# Patient Record
Sex: Female | Born: 1970 | Race: White | Hispanic: No | Smoking: Former smoker
Health system: Southern US, Community
[De-identification: ages and names within clinical notes are randomized; demographics above are authoritative.]

## PROBLEM LIST (undated history)

## (undated) DIAGNOSIS — F909 Attention-deficit hyperactivity disorder, unspecified type: Secondary | ICD-10-CM

## (undated) DIAGNOSIS — F329 Major depressive disorder, single episode, unspecified: Secondary | ICD-10-CM

## (undated) DIAGNOSIS — F32A Depression, unspecified: Secondary | ICD-10-CM

## (undated) DIAGNOSIS — F419 Anxiety disorder, unspecified: Secondary | ICD-10-CM

## (undated) DIAGNOSIS — Z8619 Personal history of other infectious and parasitic diseases: Secondary | ICD-10-CM

## (undated) HISTORY — PX: BACK SURGERY: SHX140

## (undated) HISTORY — PX: ABDOMINAL HYSTERECTOMY: SHX81

## (undated) HISTORY — PX: OTHER SURGICAL HISTORY: SHX169

---

## 1999-08-17 ENCOUNTER — Inpatient Hospital Stay (HOSPITAL_COMMUNITY): Admission: AD | Admit: 1999-08-17 | Discharge: 1999-08-17 | Payer: Self-pay | Admitting: Obstetrics

## 2000-01-23 ENCOUNTER — Emergency Department (HOSPITAL_COMMUNITY): Admission: EM | Admit: 2000-01-23 | Discharge: 2000-01-23 | Payer: Self-pay | Admitting: Emergency Medicine

## 2000-03-23 ENCOUNTER — Emergency Department (HOSPITAL_COMMUNITY): Admission: EM | Admit: 2000-03-23 | Discharge: 2000-03-24 | Payer: Self-pay | Admitting: Emergency Medicine

## 2003-04-06 ENCOUNTER — Other Ambulatory Visit: Admission: RE | Admit: 2003-04-06 | Discharge: 2003-04-06 | Payer: Self-pay | Admitting: Obstetrics & Gynecology

## 2003-10-23 ENCOUNTER — Inpatient Hospital Stay (HOSPITAL_COMMUNITY): Admission: AD | Admit: 2003-10-23 | Discharge: 2003-10-25 | Payer: Self-pay | Admitting: Obstetrics & Gynecology

## 2003-12-02 ENCOUNTER — Encounter (INDEPENDENT_AMBULATORY_CARE_PROVIDER_SITE_OTHER): Payer: Self-pay

## 2003-12-02 ENCOUNTER — Ambulatory Visit (HOSPITAL_COMMUNITY): Admission: RE | Admit: 2003-12-02 | Discharge: 2003-12-02 | Payer: Self-pay | Admitting: Obstetrics and Gynecology

## 2004-09-02 ENCOUNTER — Emergency Department (HOSPITAL_COMMUNITY): Admission: EM | Admit: 2004-09-02 | Discharge: 2004-09-03 | Payer: Self-pay | Admitting: Emergency Medicine

## 2006-02-19 ENCOUNTER — Inpatient Hospital Stay (HOSPITAL_COMMUNITY): Admission: EM | Admit: 2006-02-19 | Discharge: 2006-03-01 | Payer: Self-pay | Admitting: Emergency Medicine

## 2006-02-20 ENCOUNTER — Ambulatory Visit: Payer: Self-pay | Admitting: Pulmonary Disease

## 2006-02-25 ENCOUNTER — Encounter (INDEPENDENT_AMBULATORY_CARE_PROVIDER_SITE_OTHER): Payer: Self-pay | Admitting: *Deleted

## 2006-02-27 ENCOUNTER — Ambulatory Visit: Payer: Self-pay | Admitting: Infectious Diseases

## 2007-01-19 ENCOUNTER — Emergency Department (HOSPITAL_COMMUNITY): Admission: EM | Admit: 2007-01-19 | Discharge: 2007-01-19 | Payer: Self-pay | Admitting: *Deleted

## 2007-04-21 ENCOUNTER — Emergency Department (HOSPITAL_COMMUNITY): Admission: EM | Admit: 2007-04-21 | Discharge: 2007-04-21 | Payer: Self-pay | Admitting: Emergency Medicine

## 2007-09-25 ENCOUNTER — Encounter (INDEPENDENT_AMBULATORY_CARE_PROVIDER_SITE_OTHER): Payer: Self-pay | Admitting: Specialist

## 2007-09-25 ENCOUNTER — Observation Stay (HOSPITAL_COMMUNITY): Admission: RE | Admit: 2007-09-25 | Discharge: 2007-09-26 | Payer: Self-pay | Admitting: Specialist

## 2007-10-02 ENCOUNTER — Inpatient Hospital Stay (HOSPITAL_COMMUNITY): Admission: EM | Admit: 2007-10-02 | Discharge: 2007-10-14 | Payer: Self-pay | Admitting: Emergency Medicine

## 2007-10-03 ENCOUNTER — Ambulatory Visit: Payer: Self-pay | Admitting: Infectious Diseases

## 2007-10-13 ENCOUNTER — Encounter: Payer: Self-pay | Admitting: Infectious Diseases

## 2007-10-13 ENCOUNTER — Ambulatory Visit: Payer: Self-pay | Admitting: Surgery

## 2007-10-23 ENCOUNTER — Encounter: Payer: Self-pay | Admitting: Infectious Disease

## 2007-11-19 ENCOUNTER — Encounter: Payer: Self-pay | Admitting: Infectious Disease

## 2007-11-19 ENCOUNTER — Ambulatory Visit (HOSPITAL_COMMUNITY): Admission: RE | Admit: 2007-11-19 | Discharge: 2007-11-19 | Payer: Self-pay | Admitting: Infectious Disease

## 2007-11-19 ENCOUNTER — Ambulatory Visit: Payer: Self-pay | Admitting: Infectious Disease

## 2007-11-19 ENCOUNTER — Ambulatory Visit: Payer: Self-pay | Admitting: Surgery

## 2007-11-19 DIAGNOSIS — G061 Intraspinal abscess and granuloma: Secondary | ICD-10-CM

## 2007-11-19 DIAGNOSIS — A4901 Methicillin susceptible Staphylococcus aureus infection, unspecified site: Secondary | ICD-10-CM | POA: Insufficient documentation

## 2007-11-19 DIAGNOSIS — Z8619 Personal history of other infectious and parasitic diseases: Secondary | ICD-10-CM

## 2007-11-19 DIAGNOSIS — T80218A Other infection due to central venous catheter, initial encounter: Secondary | ICD-10-CM

## 2007-11-19 DIAGNOSIS — G9611 Dural tear: Secondary | ICD-10-CM

## 2007-11-19 LAB — CONVERTED CEMR LAB
Basophils Relative: 0 % (ref 0–1)
CO2: 23 meq/L (ref 19–32)
Calcium: 9.7 mg/dL (ref 8.4–10.5)
Creatinine, Ser: 0.72 mg/dL (ref 0.40–1.20)
Eosinophils Absolute: 0.3 10*3/uL (ref 0.2–0.7)
Eosinophils Relative: 4 % (ref 0–5)
Glucose, Bld: 90 mg/dL (ref 70–99)
HCT: 38.8 % (ref 36.0–46.0)
Lymphs Abs: 2 10*3/uL (ref 0.7–4.0)
MCHC: 33 g/dL (ref 30.0–36.0)
MCV: 96.8 fL (ref 78.0–100.0)
Monocytes Relative: 11 % (ref 3–12)
Platelets: 285 10*3/uL (ref 150–400)
Sed Rate: 22 mm/hr (ref 0–22)
WBC: 7.2 10*3/uL (ref 4.0–10.5)

## 2007-11-25 ENCOUNTER — Encounter: Payer: Self-pay | Admitting: Infectious Disease

## 2007-11-27 ENCOUNTER — Encounter: Payer: Self-pay | Admitting: Infectious Disease

## 2007-12-01 ENCOUNTER — Encounter: Payer: Self-pay | Admitting: Infectious Disease

## 2007-12-02 ENCOUNTER — Encounter: Payer: Self-pay | Admitting: Infectious Disease

## 2007-12-22 ENCOUNTER — Telehealth: Payer: Self-pay | Admitting: Infectious Disease

## 2007-12-23 ENCOUNTER — Encounter: Payer: Self-pay | Admitting: Infectious Disease

## 2008-01-27 ENCOUNTER — Emergency Department (HOSPITAL_COMMUNITY): Admission: EM | Admit: 2008-01-27 | Discharge: 2008-01-27 | Payer: Self-pay | Admitting: Emergency Medicine

## 2008-03-24 ENCOUNTER — Ambulatory Visit (HOSPITAL_COMMUNITY): Admission: RE | Admit: 2008-03-24 | Discharge: 2008-03-24 | Payer: Self-pay | Admitting: Specialist

## 2008-04-27 ENCOUNTER — Encounter: Admission: RE | Admit: 2008-04-27 | Discharge: 2008-05-12 | Payer: Self-pay | Admitting: Specialist

## 2008-05-24 ENCOUNTER — Encounter: Payer: Self-pay | Admitting: Infectious Disease

## 2008-09-12 ENCOUNTER — Ambulatory Visit (HOSPITAL_COMMUNITY): Admission: RE | Admit: 2008-09-12 | Discharge: 2008-09-12 | Payer: Self-pay | Admitting: Specialist

## 2009-02-07 ENCOUNTER — Encounter: Admission: RE | Admit: 2009-02-07 | Discharge: 2009-02-07 | Payer: Self-pay | Admitting: Orthopaedic Surgery

## 2009-08-11 ENCOUNTER — Encounter: Admission: RE | Admit: 2009-08-11 | Discharge: 2009-08-11 | Payer: Self-pay | Admitting: Orthopaedic Surgery

## 2009-08-27 IMAGING — CR DG CHEST 2V
2 series · 2 of 2 positions shown · non-contrast
Comparison: Portable study 10/03/07.

CLINICAL DATA: Post-op lumbar surgery. Fever and leukocytosis.
CHEST - 2 VIEW:

[w chest lat]
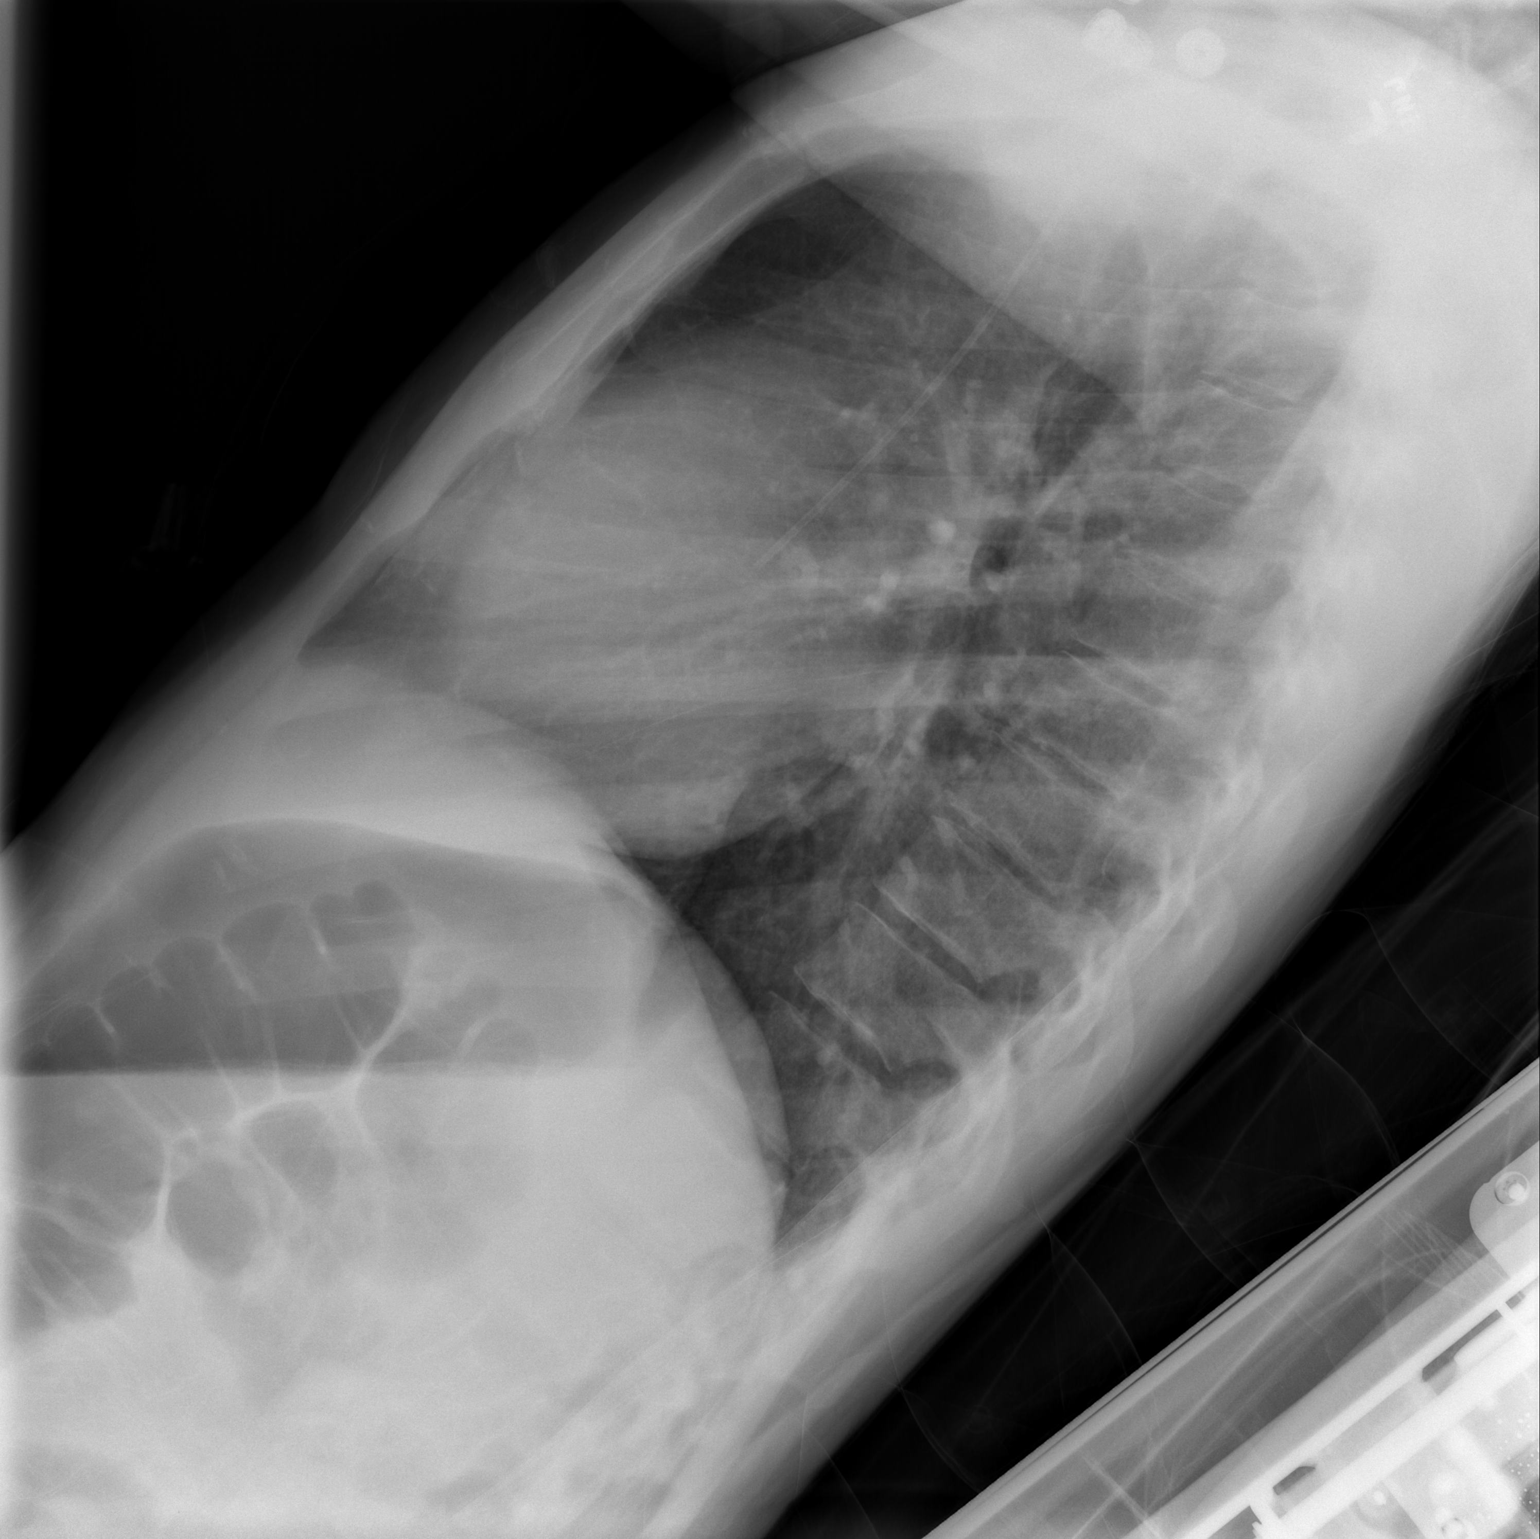

[view not recorded]
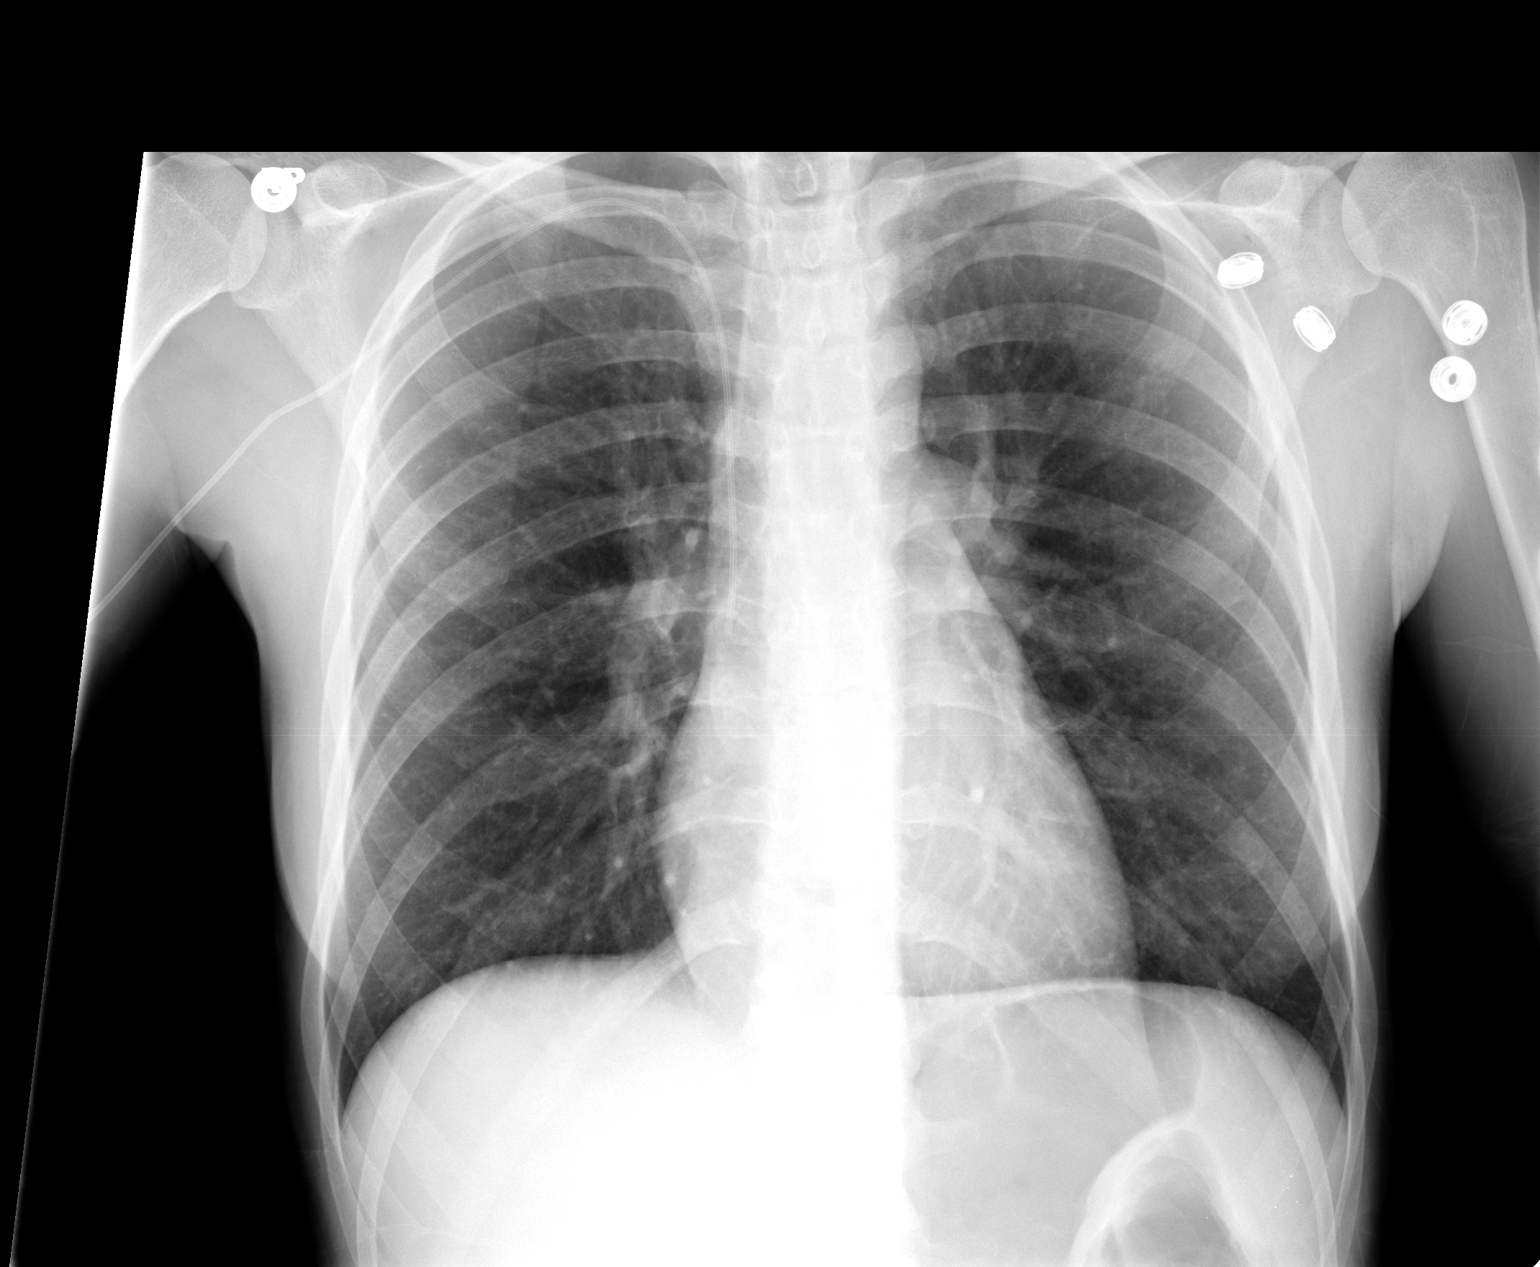

[2 of 2 positions shown; findings below may reference images not displayed]

FINDINGS: The right arm PICC is unchanged at the SVC-right atrial junction.  The lungs are clear.  There is no pleural effusion.  The cardiomediastinal contours are unchanged.
IMPRESSION: Stable postoperative chest.  No evidence of pneumonia.

## 2011-05-01 NOTE — Consult Note (Signed)
[name], [name]                ACCOUNT NO.:  [id number]   MEDICAL RECORD NO.:  [id number]          PATIENT TYPE:  INP   LOCATION:  1232                         FACILITY:  [facility]   PHYSICIAN:  Marlan Palau, M.D.  DATE OF BIRTH:  [birth year]   DATE OF CONSULTATION:  10/03/2007  DATE OF DISCHARGE:                                 CONSULTATION   HISTORY OF PRESENT ILLNESS:  [name] is a 40 year old right-  handed white female born 1971/09/30 with a history of an L5-S1  operative procedure that was done on September 25, 2007.  This was done for  herniated disk with significant spinal stenosis and cauda equina of  compression.  The patient had some weakness and numbness down the right  leg prior to this surgery.  The patient was decompressed and seemed to  recover fairly well.  The patient, however, several days after surgery  began developing fevers with increased pain in the low back to the point  where she was having trouble getting in and out of bed even to get to  the bathroom.  The patient came to the emergency room on October 02, 2007 and the patient underwent an exploratory procedure to evaluate the  patient for a possible wound infection.  The patient was found to have  pus in the subcutaneous tissues tracking down to the surgical area in  the spine.  No epidural abscess was noted, but a small rent or tear in  the dural sac was identified and repaired.  The patient was debrided.  Postoperatively, the patient had very high fevers in the 102 range and  began to complain of increased headache, nuchal rigidity and discomfort  down both legs.  Infectious disease was consulted and appropriate  antibiotic therapy was initiated.  The patient has been treated with  pain medications and has begun to feel better.  Temperature is now down.  Headache is better, but nuchal rigidity still is present.  Neurology was  asked to see this patient for further evaluation.  The patient has  had  no change in motor strength or sensation in the legs or arms.   PAST MEDICAL HISTORY:  1. Significant for the recent debridement for wound infection with      probable transient bacteremia.  2. Lumbosacral spine surgery at the L5-S1 level on September 25, 2007 as      above.  3. History of anxiety and depression.  4  History of bilateral occipital neuralgia.  1. History of left elbow fracture and surgery in the past.  2. Bilateral tubal ligation.   CURRENT MEDICATIONS:  1. Zosyn 3.375 grams q.6 h.  2. Vancomycin 1,000 mg q.12 h.  3. Tylenol if needed.  4. Vicodin if needed.  5. Robaxin 500 mg q.6 h. if needed.  6. Phenergan if needed.   ALLERGIES:  PENICILLIN.   SOCIAL HISTORY:  Smokes a pack of cigarettes a day.  Does not drink  alcohol but, apparently has had some alcohol overuse in the past, but  denies use of cocaine, marijuana.  This  [place], lives with a boyfriend and has 3 children all alive  and well.   FAMILY MEDICAL HISTORY:  Father died with an MI.  Mother is still  living, but has significant obesity and may have obstructive sleep  apnea.   REVIEW OF SYSTEMS:  Notable for the fevers as noted above.  The patient  has reports of headache that has improved some since its onset.  The  patient has denied any problems with breathing, shortness of breath,  chest pain, abdominal pain.  The patient denies any abdominal pain.  The  patient has noted some numbness of the right leg that she feels mainly  involves the thigh at this point and has not changed significantly from  preoperative symptoms.  The patient denies any problems controlling the  bowels or bladder prior to coming in for surgery.  The patient denied  any headaches prior to surgery.   PHYSICAL EXAMINATION:  VITAL SIGNS:  Blood pressure is currently 90/47,  heart rate is 97, respiratory rate 16, temperature is 99.5.  HEENT:  Head is atraumatic.  Eyes:  Pupils  are equal, round, react to  light.  Disks are flat bilaterally.  NECK:  Somewhat stiff.  RESPIRATORY:  Clear.  CARDIOVASCULAR:  Reveals a regular rate and rhythm.  No obvious murmurs  or rubs noted.  No carotid bruits are noted.  EXTREMITIES:  Without significant edema.  NEUROLOGIC:  Cranial nerves as above.  Facial symmetry is present.  Patient has good sensation of face to pinprick bilaterally.  Good visual  fields.  Extraocular movements are full.  Speech is well enunciated, not  aphasic.  Patient is fully oriented.  The patient moves all fours fairly  well, maybe had some slight weakness with hamstrings on the right as  compared the left, otherwise fairly symmetric.  Some decreased pinprick  sensation of the thigh as compared to the left.  Otherwise, more  symmetric on the feet.  Vibratory sensation is minimally impaired in the  feet, but symmetric in nature and normal in the arms.  Patient has good  pinprick sensation on the arms.  Has fair finger-nose-finger with upper  extremities.  Not performed with the lower extremities.  The patient was  not ambulated.  Deep tendon reflexes are clearly present at the knees  bilaterally.  Depressed at the ankle jerks both sides.  Toes are  neutral.  Reflexes are normal in the arms.   LABORATORY DATA:  Sodium 146, potassium 3.3, chloride of 99, bicarb 43,  glucose 106, BUN of 16, creatinine 0.74, calcium 8.7, hemoglobin of 9.5,  hematocrit 29.6, white count of 10.3, platelets of 149.   IMPRESSION:  1. History of lumbosacral spine surgery with subsequent wound      infection.  2. Status post debridement of wound infection with associated spinal      fluid leak.  3. Transient bacteremia associated with the debridement with elevated      fevers, nuchal rigidity and headache.   This patient certainly could be at risk for meningitis.  There has been  no evidence of an epidural abscess process during exploratory surgery.  The patient is being  covered for possible meningitis and infectious  disease consult is following.  At this point, the patient is clearly  fully oriented and very cooperative.  No evidence of encephalopathy is  seen.  Headache and nuchal rigidity can occur solely from transient  bacteremia and/or with spinal fluid  leak and  low-pressure headache.  At this point, I would not add much to current  treatment/management of this patient.  We will follow the patient's  clinical course while in house.  The patient should improve slowly over  time with antibiotic therapy.      Marlan Palau, M.D.  Electronically Signed     CKW/MEDQ  D:  10/03/2007  T:  10/04/2007  Job:  [id number]   cc:   Jene Every, M.D.  Fax: [phone]

## 2011-05-01 NOTE — Op Note (Signed)
NAMETKEYAH, BURKMAN                ACCOUNT NO.:  [id number]   MEDICAL RECORD NO.:  [id number]          PATIENT TYPE:  INP   LOCATION:  1232                         FACILITY:  [facility]   PHYSICIAN:  Jene Every, M.D.    DATE OF BIRTH:  January 05, 1971   DATE OF PROCEDURE:  10/02/2007  DATE OF DISCHARGE:                               OPERATIVE REPORT   PREOPERATIVE DIAGNOSIS:  Infected lumbar surgical wound.   POSTOPERATIVE DIAGNOSIS:  Infected lumbar surgical wound, dural leak.   PROCEDURE PERFORMED:  1. Irrigation and debridement of lumbar surgical wound.  2. Redo hemilaminotomy of S1.  3. Repair of dural rent.  4. Irrigation and debridement.   ANESTHESIA:  General.   SURGEON:  Jene Every, M.D.   ASSISTANT:  Georges Lynch. Darrelyn Hillock, M.D.   INDICATIONS FOR PROCEDURE:  This is a 40 year old who is a week out from  a lumbar decompression and removal of a very large disc herniation.  He  had a very small dural rent at that time that was patched with Duragen  and Tisseel.  She had a two day history of pain, fever, and headaches.  She was admitted today through the emergency room, found to have  erythema and infection, where the day prior seen, there was no evidence  of erythema or drainage from the wound.  She had a temperature of 102  and a white count of 18.  I felt she had an infected wound and took her  to the operating room for irrigation, debridement and exploration.  We  discussed the risks and benefits including bleeding, infection, damage  to neurovascular structures, need for revision, etc.   SURGICAL TECHNIQUE:  With the patient in the supine position, after  adequate general anesthesia and 750 mg vancomycin IV and Zosyn 3.75  grams, we cultured the wound with a stat gram stain.  We made an  incision over the previous surgical site with gross purulence noted.  Stat gram stain eventually showed gram positive cocci in chains and  pairs.  We irrigated the full wound extending a  bit cephalad and caudad  into the subcutaneous tissue.  It had tracked in the subcutaneous tissue  to the right and left.  This was fully irrigated, debrided, lavaged with  pulsatile lavage, and antibiotic irrigation.  Following that, we  reprepped the area and redraped the area, and inspected the wound.  The  deep fascia appeared to be intact; however, there was an instance where  we felt there was some fluid emanating from the fascial incision that  appeared to be clear.  My concern was that there was a leak given her  symptoms, as well.  I decided to explore deep in the wound.   I removed the sutures and opened the wound and explored beneath the  fascia with no frank pus noted.  However, there was we felt like leaking  fluid.  I went into the previous area of the rent, there was no evidence  that it was there.  I checked beneath the thecal sac after irrigating  the whole area copiously.  I extended the hemilaminotomy distally and  found a small rent approximately 2 mm in length.  The thecal sac was  extremely attenuated on the dorsal aspect.  I repaired the dural rent  with 4-0 Nurolon, two stitches were placed in it, well placed.  There  was no drainage following that. Again, the significant thinning of the  thecal sac was notable.  Given that attenuation, I placed a Duragen  patch on the thecal sac and Tisseel was next placed over that.  Prior to  that, we Valsalva'd, no leakage.  I then closed the fascia was #1 Vicryl  in interrupted figure-of-eight sutures loosely, tacked the subcutaneous  tissue and loosely approximated the skin with 4-0 nylon.  I then placed  a dressing, placed her supine on the hospital bed, extubated her without  difficulty, and transported her to the recovery room in satisfactory  condition.  The patient tolerated the procedure well with no  complications.   Noted, preoperative exam showed good dorsiflexion and plantar flexion.  No change in neurologic  status from previous examination which was  dysesthesia in the S1 nerve root distribution.  She had good bowel and  bladder function, predominantly was complaining of a headache, though  not orthostatic.  She had no mental status change, either.      Jene Every, M.D.  Electronically Signed     JB/MEDQ  D:  10/02/2007  T:  10/03/2007  Job:  [id number]

## 2011-05-01 NOTE — Op Note (Signed)
NAMEBRINSLEY, Brenda                ACCOUNT NO.:  [id number]   MEDICAL RECORD NO.:  [id number]          PATIENT TYPE:  INP   LOCATION:  1312                         FACILITY:  [facility]   PHYSICIAN:  Jene Every, M.D.    DATE OF BIRTH:  [birth year]   DATE OF PROCEDURE:  09/25/2007  DATE OF DISCHARGE:                               OPERATIVE REPORT   PREOPERATIVE DIAGNOSES:  1. Herniated nucleus pulposus.  2. Degenerative joint disease.  3. Spinal stenosis.   POSTOPERATIVE DIAGNOSES:  1. Herniated nucleus pulposus.  2. Degenerative joint disease.  3. Spinal stenosis.   PROCEDURE PERFORMED:  1. Central decompression at L5-S1 with microdiskectomy, bilateral      lateral recess decompression, foraminotomy of L5-S1 on the right.  2. Application of DuraGen to dural rent.   ANESTHESIA:  General.   ASSISTANT:  Georges Lynch. Gioffre, M.D.   BRIEF HISTORY AND INDICATIONS:  This is a 40 year old female who has  been treated for a disk herniation, L5-S1, had an exacerbation of her  symptoms, a repeat MRI indicating a large extruded fragment at L5-S1  severely compressing the thecal sac.  She had dysesthesias in the L5-S1  nerve root distribution on the right.  She had significant pain on the  left.  EHL weakness was noted on the right as well as plantar flexion,  diminished Achilles reflex.  She had decreased sensation in those  dermatomes, straight leg raise being positive.  Had pain radiating down  into the left, had numbness up into the buttock and cheek.  Over the  past month had two episodes of stress incontinence.  Severe pain.  She  was indicated for decompression of the thecal sac given the severity.  Risks and benefits discussed, including bleeding, infection, damage to  neurovascular structures, CSF leakage, epidural fibrosis, adjacent  segment disease and the need for procedures, need for fusion in the  future, anesthetic complications, etc.   Taken to the operating room and placed  in the supine position.  After  induction of adequate anesthesia and 1 g of vancomycin, she was placed  prone on the [place] frame.  All bony prominences were well-padded.  The  lumbar region was prepped and draped in the usual sterile fashion.  Eighteen-gauge spinal needle was utilized to localize the L5-S1  interspace, confirmed with x-ray.  Incision was made from the spinous  process L5 to S1.  Subcutaneous tissue was dissected.  Electrocautery  was utilized to achieve hemostasis.  The dorsolumbar fascia was  identified and divided in the line of the skin incision.  Paraspinous  muscle elevated from lamina of L5 and S1.  The operating microscope  draped and brought into the surgical field.  Due to the severe  compression of the sac and the stenosis noted, we felt that central  decompression would be the safest approach to decompress thecal sac.  We  therefore removed the interspinous ligament partially, the spinous  process of L5 and S1.  Following this, then given the herniation that  was noted more to the right, using a 2-mm Kerrison I detached the  ligamentum flavum from the cephalad edge of S1 and the caudad edge of L5  on the right, first out into the lateral recess and then onto the  lamina, performing foraminotomies and enlarging the hemilaminotomy of S1  and then L5.  After removing ligamentum flavum from the left, we then  proceeded to do so from the right.  I first decompressed the lateral  recess out laterally with the 2-mm Kerrison, hemilaminotomy of the  caudad edge of L5 and the cephalad edge of S1, performing a foraminotomy  of S1.  Then removed the ligamentum flavum from the interspace and noted  there was a large disk herniation paracentral to the right between the  L5 and the S1 nerve roots compressing both nerve roots significantly and  the thecal sac.  This was gently mobilized laterally, slipping it out  from beneath the thecal sac to the right-hand side with  pituitaries in a  pituitary type of maneuver, gently pulling this outward from the thecal  sac.  This was a huge disk herniation when it was finally removed in its  entirety.  This was significantly decompressed and felt that it was  behind the posterior longitudinal ligament in the axilla of the L5 root  and compressing the S1 nerve root in the right side and a significant  part of the thecal sac.  We then felt that there was fair amount of what  appeared to be steroids on the L5 nerve root on the right.  In that  region the nerve root was adhered to the ligamentum flavum and to some  tissue over in the foramen.  We gently mobilized it with a neural patty  and a Penfield.  There was a small pinhole-type rent around the area  where we believed the injection was done previously.  There was slight  leakage there.  Fully decompressed with performing foraminotomies.  There was no actual disk space due to her disk degeneration.  I placed a  hockey stick probe cephalad and caudad.  Excellent decompression was  noted and restoration of the thecal sac, good mobility of the L5 and S1  nerve roots at least a centimeter medial to the pedicle.  This space was  copiously irrigated with antibiotic irrigation.  I then placed a Duragen  patch over the small area of the rent in the L5 root and then placed  Duragen upon that and allowed it to solidify.   Again we checked finally beneath the thecal sac and both foramina of L5  and S1, the axillae of both roots.  There was no residual disk  herniation.  We sent the disk to pathology for appropriate evaluation.   Next, the wound was copiously irrigated again.  The operating microscope  was then removed.  Dorsolumbar fascia proximal with 0 Vicryl interrupted  figure-of-eight sutures, subcu 2-0 Vicryl simple sutures, skin  reapproximated with 4-0 subcuticular Prolene.  Wound reinforced with  Steri-Strips.  Sterile dressing applied.  Placed supine on the  hospital  bed, extubated without difficulty, and transported to the recovery room  in satisfactory condition.   The patient tolerated the procedure well with no complications.      Jene Every, M.D.  Electronically Signed     JB/MEDQ  D:  09/25/2007  T:  09/26/2007  Job:  [id number]

## 2011-05-01 NOTE — H&P (Signed)
[name], HOHN                ACCOUNT NO.:  [id number]   MEDICAL RECORD NO.:  [id number]          PATIENT TYPE:  INP   LOCATION:  1232                         FACILITY:  [facility]   PHYSICIAN:  Jene Every, M.D.    DATE OF BIRTH:  1971-03-25   DATE OF ADMISSION:  10/02/2007  DATE OF DISCHARGE:                              HISTORY & PHYSICAL   CHIEF COMPLAINT:  Low back pain, fever, nausea and headache.   HISTORY:  This is a 40 year old female with a 2-day history of the above  mentioned symptoms who developed a temperature this morning and  significant pain.  She went to the emergency room and was noted to have  a draining wound with erythema.  I was consulted from the emergency room  by Dr. Effie Shy.  She underwent an appropriate workup which included a  white blood cell count of 18,000.  She had drainage which he cultured.  She was also given vancomycin and Zosyn intravenous antibiotic.  A chest  x-ray was unremarkable and a urinalysis which was unremarkable as well.  In conversation with the emergency room I requested and urgent I&D of  the lumbar wound.   REVIEW OF SYSTEMS:  She denied bowel or bladder dysfunction but had a  headache and fever.   PHYSICAL EXAMINATION:  Examination in the holding room revealed that she  was complaining of pain in the posterior occiput but had no nuchal  rigidity.  She was reporting a posterior headache as well.  Straight leg  raise was negative.  She had good dorsiflexion and plantar flexion, good  EHL.  Sensory was decreased in S1 dermatome which was unchanged since  her postoperative exam.  No Babinski or clonus was noted.  She was alert  and oriented.  No evidence of DVT. Pulses were intact as well.   IMPRESSION:  Infected lumbar wound 1 week postoperatively with possible  encephali.   PLAN:  Return to the operating room for emergent incision and drainage  of the lumbar wound, exploration and evaluation. Risks, benefits were  discussed  including bleeding, infection, change in symptoms or worsening  symptoms, need for repeat debridement in the future, anesthetic  complications, etc.      Jene Every, M.D.  Electronically Signed     JB/MEDQ  D:  10/02/2007  T:  10/03/2007  Job:  [id number]

## 2011-05-01 NOTE — Op Note (Signed)
[name], [name]                ACCOUNT NO.:  [id number]   MEDICAL RECORD NO.:  [id number]          PATIENT TYPE:  INP   LOCATION:  1232                         [facility]AC[facility]   PHYSICIAN:  Jene Every, M.D.    DATE OF BIRTH:  April 05, 1971   DATE OF PROCEDURE:  10/06/2007  DATE OF DISCHARGE:                               OPERATIVE REPORT   PREOPERATIVE DIAGNOSIS:  Status post incision and drainage of lumbar  laminectomy wound, repair of dural rent.   POSTOPERATIVE DIAGNOSIS:  Status post incision and drainage of lumbar  laminectomy wound, repair of dural rent.   PROCEDURE PERFORMED:  1. Repeat irrigation debridement of lumbar wound.  2. Revision of dural.   ANESTHESIA:  General.   ASSISTANT:  Brooks.   BRIEF HISTORY AND INDICATIONS:  This is a 40 year old who is status post  incision and drainage of a lumbar wound for a superficial spinal  infection.  The patient had a previous lumbar decompression, had a staph  infection methicillin-sensitive.  She went the emergency room for  initial irrigation debridement, packing, open and she now returns for  exploration of the wound as her white count is returned to normal.  She  has been afebrile without evidence of erythema.  She is indicated for  repeat I&D and evaluation of the repair site.  Risks and benefits  discussed including bleeding, infection, damage to neurovascular  structures, need for repeat revision, anesthetic complications etc.   TECHNIQUE:  The patient in supine position, after adequate anesthesia  containing vancomycin, lumbar region prepped draped the usual sterile  fashion.  Removed the previous surgical sutures.  Evaluated the  subcutaneous tissue, was no evidence of gross purulence.  A I curetted  the subcutaneous tissue irrigated, debrided it.  It looked good with  good granulation tissue.  We then removed the fascial sutures.  I draped  the operating microscope and brought it into the surgical field.  Previous area where Tisseel and Duragen had been placed was evaluated.  We Valsalvaed the patient and appeared at least on the left there was  perhaps some what may have been CSF leakage.  Therefore removed the  Tisseel and the Duragen.  To evaluate the previous area of repair.  Again there was this area noted of extremely thinned dura on the dorsal  surface.  The previous area of the repair was contiguous to that.  We  Valsalvaed.  There appeared to be some slight leakage from this area.  It was felt to incorporate an area of the thicker dura we would use a  pursestring type suture.  We chose a 6-0 Prolene.  Removed the previous  two stitches.  And then used a 6-0 Prolene two of them in a pursestring  type suture which seemed to pull together a thicker portion of the dura  over the area of attenuation.  This appeared to provide excellent  sealing.  We then [facility] the patient to 40 with the patient in  reverse Trendelenburg with no evidence of CSF leakage.  Copiously  irrigated the wounds as well.  Prior to  prior to that we examined  beneath the lamina of 5 cephalad to caudad and the foramen of five and  S1 no neural compressive lesion.  No active CSF leakage.  Or active  bleeding.  We then again copiously irrigated.  Placed again a Duragen  patch upon the whole area of attenuation and then some Tisseel mixed  appropriately and placed into the lateral, a minor amount of that was  placed the lateral gutter of the 5 root.  Next placed a drain deep to  the fascial along the lamina and brought out through a separate stab  wound in the skin and connected to a JP bulb, gravity suction only.  Then repaired the fascia in watertight closure of #1 Vicryl interrupted  figure-of-eight sutures and a running Vicryl figure-of-eight running  suture on top of that.  Subcutaneous tissue was oversewn.  Subcutaneous  tissue reapproximated with simple sutures.  Skin was reapproximated with  staples.  Wound  was dressed sterilely.  She was placed supine on  hospital bed, extubated without difficulty.  Transported to the recovery  room the patient.  The patient tolerated the procedure well no  complications.      Jene Every, M.D.  Electronically Signed     JB/MEDQ  D:  10/06/2007  T:  10/07/2007  Job:  [id number]

## 2011-05-04 NOTE — Op Note (Signed)
[name]                          ACCOUNT NO.:  [id number]   MEDICAL RECORD NO.:  [id number]                   PATIENT TYPE:  AMB   LOCATION:  SDC                                  FACILITY:  [facility]   PHYSICIAN:  Randye Lobo, M.D.                DATE OF BIRTH:  12-Mar-1971   DATE OF PROCEDURE:  12/03/2003  DATE OF DISCHARGE:                                 OPERATIVE REPORT   PREOPERATIVE DIAGNOSES:  1. Desire for permanent sterilization.  2. Left thigh nevus.   POSTOPERATIVE DIAGNOSES:  1. Desire for permanent sterilization.  2. Left thigh nevus.  3. Dilated and enlarged gallbladder.   PROCEDURES:  1. Laparoscopic bilateral tubal ligation with bipolar cautery.  2. Removal of left thigh nevus.   SURGEON:  Randye Lobo, M.D.   ANESTHESIA:  General endotracheal.   FLUIDS REPLACED:  1000 mL Ringer's lactate.   ESTIMATED BLOOD LOSS:  Minimal.   URINE OUTPUT:  75 mL.   COMPLICATIONS:  None.   INDICATION FOR PROCEDURE:  The patient is a 40 year old gravida 4, para 3-0-  1-3, Caucasian female, status post vaginal delivery of a viable female  infant on October 23, 2003, who requests permanent sterilization.  The  patient declines options for reversible contraception.  At the patient's  preoperative visit, she was noted to have a dark 2-3 mm flat nevus of her  left thigh.  The plan was made to proceed with a laparoscopic tubal ligation  and removal of the left thigh nevus.  Risks, benefits, and alternatives have  been discussed with the patient. Failure of the tubal ligation quoted at a  rate of approximately one in 250 to one in 300, which may result in either  an intrauterine or an ectopic pregnancy, has been discussed with the  patient.   FINDINGS:  The left thigh demonstrated a 2-3 cm black, flat nevus of the  left posterior thigh.  The uterus, tubes, and ovaries were noted to be  normal at laparoscopy.  The liver was normal.  The gallbladder appeared to  be  enlarged and distended.  There were no lesions appreciated of the serosa  of the gallbladder.  The stomach organ appeared to be normal.  The bowel did  not demonstrate any obvious lesions.  The appendix was visualized (excluding  its tip, which was retrocecal), and it appeared to be normal.   SPECIMENS:  The left thigh nevus was sent to pathology.   DESCRIPTION OF PROCEDURE:  The patient was identified in the preop hold  area.  She did receive Clindamycin 900 mg intravenously for antibiotic  prophylaxis.  The patient in the operating room received general  endotracheal anesthesia and was placed in the dorsal lithotomy position.  The abdomen and thigh and the vagina were sterilely prepped.   Attention was turned to the left thigh, where a scalpel was used to sharply  excise the nevus from the thigh.  Two interrupted sutures of 3-0 Vicryl were  used to close the skin.  Hemostasis was excellent.   A Foley catheter was placed inside the bladder and a speculum was placed  inside the vagina.  A single-tooth tenaculum was placed on the anterior  cervical lip, and this was replaced with a Hulka tenaculum.  There was some  bleeding noted at one of the tenaculum sites.  This responded to pressure  with the ring forceps.   Attention was then turned to the abdomen, where a 1 cm umbilical incision  was made sharply with a scalpel.  This was carried down to the fascia using  an Allis clamp.  A 10 mm trocar was then inserted directly into the  peritoneal cavity, and the laparoscope confirmed proper placement.  A  pneumoperitoneum was then achieved, and the patient was placed in a  Trendelenburg position.   A 5 cm suprapubic incision was created sharply with a scalpel, and a 5 mm  trocar was placed under direct visualization of the laparoscope.  Inspection  of the pelvic and abdominal organs was performed, and the findings are as  noted above.   The bipolar Kleppinger forceps was then used to  perform the tubal ligation.  The right fallopian tube was grasped and followed all the way to its  fimbriated end.  It was then re-grasped in the isthmic portion, and a  contiguous segment of 3 cm of fallopian tube was cauterized such that it had  a blanched white color to it.  This did extend into the mesosalpinx.  The  same procedure performed on the right was then repeated on the left-hand  side.   The 5 mm suprapubic trocar was removed under visualization of the  laparoscope.  The pneumoperitoneum was released, and the 10 mm umbilical  trocar and the laparoscope were removed simultaneously.  The umbilical  fascia was closed with a through-and-through suture of 0 Vicryl.  The skin  was closed with subcuticular sutures of 3-0 plain.  All of the incisions  were covered with sterile bandages.   The patient was cleansed of Betadine.  All of the instruments were removed  from the vagina and bladder.  There were no complications to the procedure.  All needle, instrument, and lap counts were correct.                                               Randye Lobo, M.D.    BES/MEDQ  D:  12/02/2003  T:  12/03/2003  Job:  [id number]
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
## 2011-05-04 NOTE — Discharge Summary (Signed)
NAMEDEMARA, LOVER                ACCOUNT NO.:  [id number]   MEDICAL RECORD NO.:  [id number]          PATIENT TYPE:  INP   LOCATION:  1539                         FACILITY:  [facility]   PHYSICIAN:  Jene Every, M.D.    DATE OF BIRTH:  1971-02-16   DATE OF ADMISSION:  10/02/2007  DATE OF DISCHARGE:  10/14/2007                               DISCHARGE SUMMARY   ADMISSION DIAGNOSES:  1. Status post lumbar decompression with development of an incisional      infection with bacteremia.  2. History of anxiety and depression.  3. History of bilateral occipital neuralgia.  4. History of previous left elbow fracture.  5. Bilateral tubal ligation.  6. History of atypical infections with previous admissions at [facility].   DISCHARGE DIAGNOSES:  1. Status post lumbar decompression with development of an incisional      infection with bacteremia.  2. Status post irrigation and debridement of the lumbar wound x2 with      repair of a dural rent.  3. Asymptomatic urinary tract infection.  4. Resolved leukocytosis as well as fever.  5. Diagnosis of bacteremia secondary to methicillin-resistant      Staphylococcus aureus.  6. History of anxiety and depression.  7. History of bilateral occipital neuralgia.  8. History of previous left elbow fracture.  9. Bilateral tubal ligation.  10.History of atypical infection with previous admissions at [facility].   CONSULTANTS:  1. Neurology, Marlan Palau, M.D.  2. Infectious disease.  3. Nutritional consult.  4. PT.  5. OT.  6. Care Management.   HISTORY:  Ms. Woodfield is a 40 year old female who had a previous lumbar  decompression at L5-S1 done by Dr. Shelle Iron on October 0.  She did well  from this.  The area was decompressed without incident and the patient  was discharged home.  However, several days following surgery she  developed fevers and increasing pain in the low back.  She had  increasing lower extremity  pain, trouble getting in and out of bed.  She  was evaluated in our office and sent immediately to the emergency room  on October 16.  Dr. Shelle Iron at that time recommended that he debride the  incision for possible infection.  The risks and benefits of this were  discussed with the patient.  She did wish to proceed.   HOSPITAL COURSE:  The patient was brought immediately to the emergency  room, then taken to the operating room on October 16 by Dr. Shelle Iron.  The  wound was debrided all the way down to the thecal sac.  There was a  dural rent that was repaired.  The wound was partially left open at that  time.  Surgeon Dr. Jene Every, assistant was Georges Lynch. Darrelyn Hillock, M.D.  The patient was then to the ICU unit.  Infectious disease was consulted  as well as neurology.  The patient, unfortunately, spiked high  temperatures.  IV antibiotics were adjusted accordingly.  She was  currently on vancomycin and Zosyn.  She was alert and responsive at that  time with good motor function to the upper and lower extremities;  however, she was hyperreflexic.  There was no Babinski or clonus noted.  They decided in conjunction with infectious disease and Dr. Shelle Iron that  they would treat the patient for early meningitis.  There was again no  evidence of epidural abscess noted of the time of surgery.  Dr. Lesia Sago was also consulted.  Postoperative day #2 the patient had  decreased pain.  She denied headache.  Temperature remained at 101.5.  White cell count had decreased to 15.8.  Calves were soft and nontender.  There was no change in motor and sensation.  Antibiotics were continued.  Infectious disease and neurology continued to follow along with [place].  Appropriate cultures were obtained.  The patient was continued on  vancomycin and Zosyn.  Appropriate workups were obtained to rule out any  immunocompromised state that would have made her susceptible to this  type of infection.  The patient,  unfortunately, had a setback on the  20th with increasing fever, increasing pain.  She still had quite a bit  of drainage from her incision.  At this point Dr. Shelle Iron felt he needed  to take her back to the operating room for a repeat irrigation and  debridement.  On October 20 she was taken back for a second irrigation  and debridement of revision of the dural repair.  Dr. Shelle Iron did the  surgery, Dr. Shon Baton assisted.  Superficial Hemovac drains were placed.  The wound was completely closed at this time.  Postoperative day #1 from  this procedure, the patient was doing well.  She continued with a slight  temperature.  She continued to complain of aching in the lower  extremities.  White cell count remained stable.  She denied any  headache.  IV antibiotics were continued.  On the 21st, infectious  disease did change her antibiotics from vancomycin to Ancef.  Unfortunately, she spiked a temperature at that time and this had to be  changed back to vancomycin.  Following that, the temperature did  resolve.  The patient did do fairly well on the 21st through the 23rd.  Minimal drainage was noted.  Hemovacs were discontinued.  White cell  count remained normal.  Temperature continued to decrease.  Motor and  neurovascular function remained intact.  The patient was stable to be  transferred to the floor. The wound was healing, there was minimal  drainage, and PT/OT was consulted.  Nutrition was consulted.  Again the  patient continued to progress well.  She was feeling much better.  Fever  and leukocytosis had resolved.  The change back to vancomycin had  improved her symptoms significantly.  A PICC line was placed, discharge  planning was initiated.  The patient did have bilateral lower extremity  Dopplers done on the 27th, which were negative for DVT.  It was felt on  the 28th that the patient was stable to be discharged home.   DISPOSITION:  The patient discharged home with home health PT/OT.   She  is to have vancomycin for a total of 8 weeks.  She is to follow up with  infectious disease in 2 weeks, Dr. Shelle Iron in 1 week.  She is to change  her dressing daily.  She is to increase her activities slowly, utilize  her brace and walker, and no bending, twisting, lifting.   DIET:  Nutrition was consulted with the patient  and we talked about  caloric intake.  She will use Ensure as a supplement.   DISCHARGE MEDICATIONS:  1. All regular home medication.  2. Again, vancomycin for 8 weeks.  3. Robaxin 500 mg one p.o. q.6h. p.r.n. pain.  4. Percocet one to two p.o. q.4-6h. p.r.n. pain.  5. Vitamin C 500 mg daily.   CONDITION:  Stable and much improved.  Recurrent infection was discussed  with the patient.  We discussed good personal hygiene.   FINAL DIAGNOSIS:  Doing well status post irrigation and debridement x2  of lumbar incision with diagnosis of staphylococcal bacteremia.      Roma Schanz, P.A.      Jene Every, M.D.  Electronically Signed    CS/MEDQ  D:  12/15/2007  T:  12/16/2007  Job:  [id number]
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
## 2011-06-13 ENCOUNTER — Other Ambulatory Visit: Payer: Self-pay | Admitting: Internal Medicine

## 2011-06-13 DIAGNOSIS — R1900 Intra-abdominal and pelvic swelling, mass and lump, unspecified site: Secondary | ICD-10-CM

## 2011-06-14 ENCOUNTER — Other Ambulatory Visit: Payer: Self-pay

## 2011-06-18 ENCOUNTER — Ambulatory Visit
Admission: RE | Admit: 2011-06-18 | Discharge: 2011-06-18 | Disposition: A | Discharge status: Home / Self Care | Payer: Medicare Other | Source: Ambulatory Visit | Attending: Internal Medicine | Admitting: Internal Medicine

## 2011-06-18 DIAGNOSIS — R1900 Intra-abdominal and pelvic swelling, mass and lump, unspecified site: Secondary | ICD-10-CM

## 2011-07-06 ENCOUNTER — Other Ambulatory Visit: Payer: Self-pay | Admitting: Orthopaedic Surgery

## 2011-07-06 DIAGNOSIS — M545 Low back pain: Secondary | ICD-10-CM

## 2011-07-12 ENCOUNTER — Other Ambulatory Visit: Payer: Self-pay | Admitting: Family Medicine

## 2011-07-12 DIAGNOSIS — N838 Other noninflammatory disorders of ovary, fallopian tube and broad ligament: Secondary | ICD-10-CM

## 2011-07-26 ENCOUNTER — Other Ambulatory Visit: Payer: Medicare Other

## 2011-07-26 ENCOUNTER — Ambulatory Visit
Admission: RE | Admit: 2011-07-26 | Discharge: 2011-07-26 | Disposition: A | Discharge status: Home / Self Care | Payer: Medicare Other | Source: Ambulatory Visit | Attending: Orthopaedic Surgery | Admitting: Orthopaedic Surgery

## 2011-07-26 DIAGNOSIS — M545 Low back pain: Secondary | ICD-10-CM

## 2011-08-13 ENCOUNTER — Other Ambulatory Visit: Payer: Medicare Other

## 2011-08-14 ENCOUNTER — Ambulatory Visit
Admission: RE | Admit: 2011-08-14 | Discharge: 2011-08-14 | Disposition: A | Discharge status: Home / Self Care | Payer: Medicare Other | Source: Ambulatory Visit | Attending: Family Medicine | Admitting: Family Medicine

## 2011-08-14 DIAGNOSIS — N838 Other noninflammatory disorders of ovary, fallopian tube and broad ligament: Secondary | ICD-10-CM

## 2011-09-07 LAB — URINE MICROSCOPIC-ADD ON

## 2011-09-07 LAB — URINALYSIS, ROUTINE W REFLEX MICROSCOPIC
Ketones, ur: >80 — AB
Protein, ur: NEGATIVE
Urobilinogen, UA: 1

## 2011-09-07 LAB — DIFFERENTIAL
Basophils Absolute: 0
Lymphocytes Relative: 20
Monocytes Absolute: 0.9
Monocytes Relative: 10
Neutro Abs: 6.1

## 2011-09-07 LAB — COMPREHENSIVE METABOLIC PANEL
Albumin: 4
BUN: 12
Calcium: 9.5
Creatinine, Ser: 0.78
Total Bilirubin: 1.1
Total Protein: 7

## 2011-09-07 LAB — CBC
HCT: 42.1
MCHC: 35
MCV: 92.9
Platelets: 252
RDW: 12.6

## 2011-09-26 LAB — COMPREHENSIVE METABOLIC PANEL WITH GFR
ALT: 43 — ABNORMAL HIGH
ALT: 52 — ABNORMAL HIGH
ALT: 80 — ABNORMAL HIGH
ALT: 92 — ABNORMAL HIGH
AST: 25
AST: 32
AST: 62 — ABNORMAL HIGH
AST: 90 — ABNORMAL HIGH
Albumin: 1.9 — ABNORMAL LOW
Albumin: 2.2 — ABNORMAL LOW
Albumin: 2.6 — ABNORMAL LOW
Albumin: 2.7 — ABNORMAL LOW
Alkaline Phosphatase: 104
Alkaline Phosphatase: 85
Alkaline Phosphatase: 96
Alkaline Phosphatase: 96
BUN: 3 — ABNORMAL LOW
BUN: 5 — ABNORMAL LOW
BUN: 6
BUN: 9
CO2: 28
CO2: 29
CO2: 30
CO2: 30
Calcium: 8.2 — ABNORMAL LOW
Calcium: 8.9
Calcium: 9.3
Calcium: 9.4
Chloride: 101
Chloride: 102
Chloride: 102
Chloride: 99
Creatinine, Ser: 0.57
Creatinine, Ser: 0.58
Creatinine, Ser: 0.58
Creatinine, Ser: 0.59
GFR calc non Af Amer: >60
GFR calc non Af Amer: >60
GFR calc non Af Amer: >60
GFR calc non Af Amer: >60
Glucose, Bld: 111 — ABNORMAL HIGH
Glucose, Bld: 112 — ABNORMAL HIGH
Glucose, Bld: 116 — ABNORMAL HIGH
Glucose, Bld: 124 — ABNORMAL HIGH
Potassium: 3.9
Potassium: 4.1
Potassium: 4.1
Potassium: 4.2
Sodium: 134 — ABNORMAL LOW
Sodium: 134 — ABNORMAL LOW
Sodium: 136
Sodium: 137
Total Bilirubin: 0.5
Total Bilirubin: 0.7
Total Bilirubin: 0.8
Total Bilirubin: 0.8
Total Protein: 4.3 — ABNORMAL LOW
Total Protein: 5.9 — ABNORMAL LOW
Total Protein: 6.2
Total Protein: 6.2

## 2011-09-26 LAB — CBC
HCT: 26.3 — ABNORMAL LOW
HCT: 26.3 — ABNORMAL LOW
HCT: 26.6 — ABNORMAL LOW
HCT: 26.8 — ABNORMAL LOW
HCT: 28.3 — ABNORMAL LOW
HCT: 29.7 — ABNORMAL LOW
HCT: 29.7 — ABNORMAL LOW
HCT: 30.2 — ABNORMAL LOW
HCT: 31.8 — ABNORMAL LOW
Hemoglobin: 10 — ABNORMAL LOW
Hemoglobin: 10.2 — ABNORMAL LOW
Hemoglobin: 10.2 — ABNORMAL LOW
Hemoglobin: 10.4 — ABNORMAL LOW
Hemoglobin: 11.2 — ABNORMAL LOW
Hemoglobin: 12.3
Hemoglobin: 9.2 — ABNORMAL LOW
Hemoglobin: 9.2 — ABNORMAL LOW
Hemoglobin: 9.3 — ABNORMAL LOW
Hemoglobin: 9.4 — ABNORMAL LOW
Hemoglobin: 9.5 — ABNORMAL LOW
Hemoglobin: 9.9 — ABNORMAL LOW
MCHC: 34.2
MCHC: 34.3
MCHC: 34.4
MCHC: 34.5
MCHC: 34.7
MCHC: 34.8
MCHC: 34.9
MCHC: 34.9
MCHC: 35.1
MCHC: 35.1
MCHC: 35.1
MCHC: 35.2
MCHC: 35.3
MCV: 93.7
MCV: 94.1
MCV: 94.5
MCV: 94.7
MCV: 95.2
MCV: 95.4
MCV: 95.4
MCV: 95.5
MCV: 95.8
MCV: 96.3
MCV: 96.8
Platelets: 222
Platelets: 239
Platelets: 244
Platelets: 507 — ABNORMAL HIGH
Platelets: 508 — ABNORMAL HIGH
Platelets: 512 — ABNORMAL HIGH
Platelets: 515 — ABNORMAL HIGH
Platelets: 516 — ABNORMAL HIGH
Platelets: 516 — ABNORMAL HIGH
Platelets: 549 — ABNORMAL HIGH
RBC: 2.76 — ABNORMAL LOW
RBC: 2.79 — ABNORMAL LOW
RBC: 2.81 — ABNORMAL LOW
RBC: 2.82 — ABNORMAL LOW
RBC: 2.92 — ABNORMAL LOW
RBC: 2.99 — ABNORMAL LOW
RBC: 3.04 — ABNORMAL LOW
RBC: 3.07 — ABNORMAL LOW
RBC: 3.09 — ABNORMAL LOW
RBC: 3.39 — ABNORMAL LOW
RBC: 3.42 — ABNORMAL LOW
RDW: 12.6
RDW: 12.6
RDW: 12.6
RDW: 12.7
RDW: 12.7
RDW: 12.8
RDW: 12.8
RDW: 12.8
RDW: 12.8
RDW: 12.9
RDW: 12.9
RDW: 13
WBC: 13.2 — ABNORMAL HIGH
WBC: 15.8 — ABNORMAL HIGH
WBC: 7.3
WBC: 7.5
WBC: 7.8
WBC: 8.1
WBC: 9.9
WBC: 9.9

## 2011-09-26 LAB — URINE CULTURE
Colony Count: >=100000
Colony Count: NO GROWTH
Special Requests: NEGATIVE

## 2011-09-26 LAB — DIFFERENTIAL
Basophils Absolute: 0
Basophils Relative: 0
Eosinophils Absolute: 0
Eosinophils Absolute: 0
Eosinophils Relative: 0
Eosinophils Relative: 0
Lymphocytes Relative: 10 — ABNORMAL LOW
Lymphocytes Relative: 18
Lymphs Abs: 0.7
Lymphs Abs: 1.4
Lymphs Abs: 1.6
Monocytes Absolute: 1 — ABNORMAL HIGH
Monocytes Absolute: 1.1 — ABNORMAL HIGH
Monocytes Absolute: 1.5 — ABNORMAL HIGH
Monocytes Relative: 15 — ABNORMAL HIGH
Monocytes Relative: 6
Monocytes Relative: 8
Neutro Abs: 14.3 — ABNORMAL HIGH
Neutro Abs: 4.9
Neutrophils Relative %: 84 — ABNORMAL HIGH
Neutrophils Relative %: 88 — ABNORMAL HIGH

## 2011-09-26 LAB — COMPREHENSIVE METABOLIC PANEL
ALT: 18
ALT: 55 — ABNORMAL HIGH
ALT: 61 — ABNORMAL HIGH
ALT: 75 — ABNORMAL HIGH
AST: 53 — ABNORMAL HIGH
AST: 98 — ABNORMAL HIGH
Albumin: 2.7 — ABNORMAL LOW
Albumin: 2.8 — ABNORMAL LOW
Alkaline Phosphatase: 57
BUN: 6
BUN: 7
CO2: 28
CO2: 29
CO2: 29
CO2: 30
Calcium: 8.2 — ABNORMAL LOW
Calcium: 8.3 — ABNORMAL LOW
Calcium: 8.5
Calcium: 9.1
Calcium: 9.2
Chloride: 95 — ABNORMAL LOW
Creatinine, Ser: 0.44
Creatinine, Ser: 0.6
Creatinine, Ser: 0.62
GFR calc Af Amer: >60
GFR calc Af Amer: >60
GFR calc Af Amer: >60
GFR calc Af Amer: >60
GFR calc non Af Amer: >60
GFR calc non Af Amer: >60
GFR calc non Af Amer: >60
Glucose, Bld: 105 — ABNORMAL HIGH
Glucose, Bld: 109 — ABNORMAL HIGH
Glucose, Bld: 127 — ABNORMAL HIGH
Glucose, Bld: 94
Potassium: 3.4 — ABNORMAL LOW
Sodium: 130 — ABNORMAL LOW
Sodium: 133 — ABNORMAL LOW
Sodium: 134 — ABNORMAL LOW
Sodium: 135
Total Bilirubin: 0.6
Total Protein: 4.7 — ABNORMAL LOW
Total Protein: 5.4 — ABNORMAL LOW
Total Protein: 5.5 — ABNORMAL LOW
Total Protein: 5.9 — ABNORMAL LOW
Total Protein: 6.2

## 2011-09-26 LAB — WOUND CULTURE

## 2011-09-26 LAB — URINALYSIS, ROUTINE W REFLEX MICROSCOPIC
Bilirubin Urine: NEGATIVE
Bilirubin Urine: NEGATIVE
Glucose, UA: NEGATIVE
Ketones, ur: NEGATIVE
Nitrite: NEGATIVE
Nitrite: NEGATIVE
Protein, ur: NEGATIVE
Specific Gravity, Urine: 1.006
Specific Gravity, Urine: 1.019
Urobilinogen, UA: 0.2
Urobilinogen, UA: 1
pH: 5.5
pH: 7

## 2011-09-26 LAB — BASIC METABOLIC PANEL
BUN: 6
CO2: 25
CO2: 25
CO2: 26
Chloride: 102
Chloride: 103
Chloride: 105
Creatinine, Ser: 0.59
GFR calc Af Amer: >60
GFR calc Af Amer: >60
GFR calc Af Amer: >60
Glucose, Bld: 136 — ABNORMAL HIGH
Potassium: 3.7
Potassium: 4.3
Sodium: 134 — ABNORMAL LOW

## 2011-09-26 LAB — GRAM STAIN

## 2011-09-26 LAB — CULTURE, BLOOD (ROUTINE X 2)
Culture: NO GROWTH
Culture: NO GROWTH

## 2011-09-26 LAB — ANAEROBIC CULTURE: Gram Stain: NONE SEEN

## 2011-09-26 LAB — URINE MICROSCOPIC-ADD ON

## 2011-09-26 LAB — VANCOMYCIN, TROUGH
Vancomycin Tr: 15.7
Vancomycin Tr: 19.3

## 2011-09-26 LAB — TYPE AND SCREEN: Antibody Screen: NEGATIVE

## 2011-09-26 LAB — HIV ANTIBODY (ROUTINE TESTING W REFLEX): HIV: NONREACTIVE

## 2011-09-26 LAB — HEMOGLOBIN AND HEMATOCRIT, BLOOD
HCT: 29.3 — ABNORMAL LOW
Hemoglobin: 10.1 — ABNORMAL LOW

## 2011-09-26 LAB — ABO/RH: ABO/RH(D): A POS

## 2011-09-27 LAB — HEMOGLOBIN AND HEMATOCRIT, BLOOD
HCT: 44.2
Hemoglobin: 15.6 — ABNORMAL HIGH

## 2012-01-23 DIAGNOSIS — F319 Bipolar disorder, unspecified: Secondary | ICD-10-CM | POA: Diagnosis not present

## 2012-01-30 DIAGNOSIS — M545 Low back pain, unspecified: Secondary | ICD-10-CM | POA: Diagnosis not present

## 2012-01-30 DIAGNOSIS — Z5181 Encounter for therapeutic drug level monitoring: Secondary | ICD-10-CM | POA: Diagnosis not present

## 2012-01-30 DIAGNOSIS — M5137 Other intervertebral disc degeneration, lumbosacral region: Secondary | ICD-10-CM | POA: Diagnosis not present

## 2012-03-18 DIAGNOSIS — M545 Low back pain, unspecified: Secondary | ICD-10-CM | POA: Diagnosis not present

## 2012-03-18 DIAGNOSIS — IMO0002 Reserved for concepts with insufficient information to code with codable children: Secondary | ICD-10-CM | POA: Diagnosis not present

## 2012-07-21 DIAGNOSIS — F319 Bipolar disorder, unspecified: Secondary | ICD-10-CM | POA: Diagnosis not present

## 2012-10-01 DIAGNOSIS — M4800 Spinal stenosis, site unspecified: Secondary | ICD-10-CM | POA: Diagnosis not present

## 2012-10-01 DIAGNOSIS — G63 Polyneuropathy in diseases classified elsewhere: Secondary | ICD-10-CM | POA: Diagnosis not present

## 2012-10-23 DIAGNOSIS — M545 Low back pain, unspecified: Secondary | ICD-10-CM | POA: Diagnosis not present

## 2012-12-04 DIAGNOSIS — Z23 Encounter for immunization: Secondary | ICD-10-CM | POA: Diagnosis not present

## 2012-12-29 DIAGNOSIS — Z79899 Other long term (current) drug therapy: Secondary | ICD-10-CM | POA: Diagnosis not present

## 2012-12-29 DIAGNOSIS — G894 Chronic pain syndrome: Secondary | ICD-10-CM | POA: Diagnosis not present

## 2012-12-29 DIAGNOSIS — M961 Postlaminectomy syndrome, not elsewhere classified: Secondary | ICD-10-CM | POA: Diagnosis not present

## 2012-12-29 DIAGNOSIS — Z5181 Encounter for therapeutic drug level monitoring: Secondary | ICD-10-CM | POA: Diagnosis not present

## 2013-01-22 ENCOUNTER — Inpatient Hospital Stay (HOSPITAL_COMMUNITY): Payer: Medicare Other

## 2013-01-22 ENCOUNTER — Inpatient Hospital Stay (HOSPITAL_COMMUNITY)
Admission: EM | Admit: 2013-01-22 | Discharge: 2013-01-28 | DRG: 205 | Disposition: A | Discharge status: Home / Self Care | Payer: Medicare Other | Attending: Internal Medicine | Admitting: Internal Medicine

## 2013-01-22 ENCOUNTER — Emergency Department (HOSPITAL_COMMUNITY): Payer: Medicare Other

## 2013-01-22 ENCOUNTER — Encounter (HOSPITAL_COMMUNITY): Payer: Self-pay | Admitting: *Deleted

## 2013-01-22 DIAGNOSIS — R222 Localized swelling, mass and lump, trunk: Secondary | ICD-10-CM | POA: Diagnosis present

## 2013-01-22 DIAGNOSIS — Z2989 Encounter for other specified prophylactic measures: Secondary | ICD-10-CM | POA: Diagnosis not present

## 2013-01-22 DIAGNOSIS — G8929 Other chronic pain: Secondary | ICD-10-CM

## 2013-01-22 DIAGNOSIS — K029 Dental caries, unspecified: Secondary | ICD-10-CM | POA: Diagnosis not present

## 2013-01-22 DIAGNOSIS — F17200 Nicotine dependence, unspecified, uncomplicated: Secondary | ICD-10-CM | POA: Diagnosis present

## 2013-01-22 DIAGNOSIS — J679 Hypersensitivity pneumonitis due to unspecified organic dust: Secondary | ICD-10-CM

## 2013-01-22 DIAGNOSIS — K047 Periapical abscess without sinus: Secondary | ICD-10-CM | POA: Diagnosis present

## 2013-01-22 DIAGNOSIS — J984 Other disorders of lung: Principal | ICD-10-CM | POA: Diagnosis present

## 2013-01-22 DIAGNOSIS — G061 Intraspinal abscess and granuloma: Secondary | ICD-10-CM | POA: Diagnosis not present

## 2013-01-22 DIAGNOSIS — J852 Abscess of lung without pneumonia: Secondary | ICD-10-CM

## 2013-01-22 DIAGNOSIS — M549 Dorsalgia, unspecified: Secondary | ICD-10-CM | POA: Diagnosis present

## 2013-01-22 DIAGNOSIS — F3289 Other specified depressive episodes: Secondary | ICD-10-CM | POA: Diagnosis present

## 2013-01-22 DIAGNOSIS — J84112 Idiopathic pulmonary fibrosis: Secondary | ICD-10-CM | POA: Diagnosis present

## 2013-01-22 DIAGNOSIS — F909 Attention-deficit hyperactivity disorder, unspecified type: Secondary | ICD-10-CM | POA: Diagnosis not present

## 2013-01-22 DIAGNOSIS — Z418 Encounter for other procedures for purposes other than remedying health state: Secondary | ICD-10-CM

## 2013-01-22 DIAGNOSIS — F329 Major depressive disorder, single episode, unspecified: Secondary | ICD-10-CM

## 2013-01-22 DIAGNOSIS — I339 Acute and subacute endocarditis, unspecified: Secondary | ICD-10-CM | POA: Diagnosis not present

## 2013-01-22 DIAGNOSIS — D72829 Elevated white blood cell count, unspecified: Secondary | ICD-10-CM | POA: Diagnosis present

## 2013-01-22 DIAGNOSIS — J9859 Other diseases of mediastinum, not elsewhere classified: Secondary | ICD-10-CM | POA: Diagnosis not present

## 2013-01-22 DIAGNOSIS — F32A Depression, unspecified: Secondary | ICD-10-CM | POA: Diagnosis present

## 2013-01-22 DIAGNOSIS — F411 Generalized anxiety disorder: Secondary | ICD-10-CM | POA: Diagnosis present

## 2013-01-22 DIAGNOSIS — Z8619 Personal history of other infectious and parasitic diseases: Secondary | ICD-10-CM | POA: Diagnosis present

## 2013-01-22 DIAGNOSIS — J189 Pneumonia, unspecified organism: Secondary | ICD-10-CM | POA: Diagnosis not present

## 2013-01-22 DIAGNOSIS — G9611 Dural tear: Secondary | ICD-10-CM | POA: Diagnosis present

## 2013-01-22 DIAGNOSIS — J40 Bronchitis, not specified as acute or chronic: Secondary | ICD-10-CM | POA: Diagnosis not present

## 2013-01-22 DIAGNOSIS — R079 Chest pain, unspecified: Secondary | ICD-10-CM | POA: Diagnosis not present

## 2013-01-22 HISTORY — DX: Attention-deficit hyperactivity disorder, unspecified type: F90.9

## 2013-01-22 HISTORY — DX: Major depressive disorder, single episode, unspecified: F32.9

## 2013-01-22 HISTORY — DX: Depression, unspecified: F32.A

## 2013-01-22 HISTORY — DX: Personal history of other infectious and parasitic diseases: Z86.19

## 2013-01-22 HISTORY — DX: Anxiety disorder, unspecified: F41.9

## 2013-01-22 LAB — CBC
Hemoglobin: 13 g/dL (ref 12.0–15.0)
MCHC: 35.1 g/dL (ref 30.0–36.0)
Platelets: 390 10*3/uL (ref 150–400)
RBC: 4 MIL/uL (ref 3.87–5.11)
RDW: 12.4 % (ref 11.5–15.5)
WBC: 12.4 10*3/uL — ABNORMAL HIGH (ref 4.0–10.5)
WBC: 12.3 10*3/uL — ABNORMAL HIGH (ref 4.0–10.5)

## 2013-01-22 LAB — COMPREHENSIVE METABOLIC PANEL
ALT: 9 U/L (ref 0–35)
Alkaline Phosphatase: 120 U/L — ABNORMAL HIGH (ref 39–117)
Chloride: 97 mEq/L (ref 96–112)
GFR calc Af Amer: >90 mL/min (ref >90)
Glucose, Bld: 96 mg/dL (ref 70–99)
Potassium: 4 mEq/L (ref 3.5–5.1)
Sodium: 135 mEq/L (ref 135–145)
Total Bilirubin: 0.3 mg/dL (ref 0.3–1.2)
Total Protein: 7 g/dL (ref 6.0–8.3)

## 2013-01-22 LAB — CREATININE, SERUM
Creatinine, Ser: 0.68 mg/dL (ref 0.50–1.10)
GFR calc Af Amer: >90 mL/min
GFR calc non Af Amer: >90 mL/min

## 2013-01-22 LAB — RAPID URINE DRUG SCREEN, HOSP PERFORMED
Barbiturates: NOT DETECTED
Benzodiazepines: NOT DETECTED
Cocaine: NOT DETECTED
Tetrahydrocannabinol: NOT DETECTED

## 2013-01-22 MED ORDER — HEPARIN SODIUM (PORCINE) 5000 UNIT/ML IJ SOLN
5000.0000 [IU] | Freq: Three times a day (TID) | INTRAMUSCULAR | Status: DC
  Filled 2013-01-22: qty 1

## 2013-01-22 MED ORDER — CLONAZEPAM 1 MG PO TABS
1.0000 mg | ORAL_TABLET | Freq: Two times a day (BID) | ORAL | Status: DC | PRN
  Administered 2013-01-24 – 2013-01-26 (×3): 1 mg via ORAL
  Filled 2013-01-22 (×3): qty 1

## 2013-01-22 MED ORDER — IOHEXOL 300 MG/ML  SOLN
100.0000 mL | Freq: Once | INTRAMUSCULAR | Status: AC | PRN
  Administered 2013-01-22: 80 mL via INTRAVENOUS

## 2013-01-22 MED ORDER — LEVOFLOXACIN IN D5W 750 MG/150ML IV SOLN
750.0000 mg | Freq: Once | INTRAVENOUS | Status: AC
  Administered 2013-01-22: 750 mg via INTRAVENOUS
  Filled 2013-01-22: qty 150

## 2013-01-22 MED ORDER — HYDROCODONE-ACETAMINOPHEN 5-325 MG PO TABS
1.0000 | ORAL_TABLET | Freq: Four times a day (QID) | ORAL | Status: DC | PRN
  Administered 2013-01-22 – 2013-01-25 (×8): 2 via ORAL
  Filled 2013-01-22 (×9): qty 2

## 2013-01-22 MED ORDER — DULOXETINE HCL 60 MG PO CPEP
60.0000 mg | ORAL_CAPSULE | Freq: Two times a day (BID) | ORAL | Status: DC
  Administered 2013-01-23 – 2013-01-28 (×11): 60 mg via ORAL
  Filled 2013-01-22 (×13): qty 1

## 2013-01-22 MED ORDER — MORPHINE SULFATE 4 MG/ML IJ SOLN
4.0000 mg | Freq: Once | INTRAMUSCULAR | Status: AC
  Administered 2013-01-22: 4 mg via INTRAVENOUS
  Filled 2013-01-22: qty 1

## 2013-01-22 NOTE — ED Provider Notes (Addendum)
History    CSN: [id number] Arrival date & time 01/22/13  1706 First MD Initiated Contact with Patient 01/22/13 1926    Chief Complaint  Patient presents with  . Headache  . Chest Pain    HPI Pt started with fever, chills, flu type symptoms a week or two ago.  The symptoms persisted and then last night she started having pain in her chest.   She has had a dry cough.  She feels short of breath with the coughing.  NO vomiting or diarrhea.  She has had a headache.  Patient denies any history of IV drug use. She has history of MRSA but denies any other significant chronic medical problems Past Medical History  Diagnosis Date  . History of MRSA infection   . ADHD (attention deficit hyperactivity disorder)   . Anxiety   . Depression     Past Surgical History  Procedure Date  . Back surgery     No family history on file.  History  Substance Use Topics  . Smoking status: Current Every Day Smoker -- 0.5 packs/day    Types: Cigarettes  . Smokeless tobacco: Not on file  . Alcohol Use: No    OB History    Grav Para Term Preterm Abortions TAB SAB Ect Mult Living                  Review of Systems  Genitourinary: Negative for dysuria.  All other systems reviewed and are negative.    Allergies  Penicillins  Home Medications  No current outpatient prescriptions on file.  BP 107/68  Pulse 91  Temp 99.1 F (37.3 C) (Oral)  Resp 18  SpO2 97%  Physical Exam  Nursing note and vitals reviewed. Constitutional: No distress.  HENT:  Head: Normocephalic and atraumatic.  Right Ear: External ear normal.  Left Ear: External ear normal.  Eyes: Conjunctivae normal are normal. Right eye exhibits no discharge. Left eye exhibits no discharge. No scleral icterus.  Neck: Normal range of motion. Neck supple. No tracheal deviation present.  Cardiovascular: Normal rate, regular rhythm and intact distal pulses.   No murmur heard.      Exam Limited due to surrounding noise but I do not  appreciate any murmur  Pulmonary/Chest: Effort normal. No stridor. No respiratory distress. She has wheezes. She has rales.  Abdominal: Soft. Bowel sounds are normal. She exhibits no distension. There is no tenderness. There is no rebound and no guarding.  Musculoskeletal: She exhibits no edema and no tenderness.  Neurological: She is alert. She has normal strength. No sensory deficit. Cranial nerve deficit:  no gross defecits noted. She exhibits normal muscle tone. She displays no seizure activity. Coordination normal.  Skin: Skin is warm and dry. No rash noted. She is not diaphoretic.  Psychiatric: She has a normal mood and affect.    ED Course  Procedures (including critical care time)  Labs Reviewed  CBC - Abnormal; Notable for the following:    WBC 12.4 (*)     Platelets 427 (*)     All other components within normal limits  COMPREHENSIVE METABOLIC PANEL - Abnormal; Notable for the following:    Albumin 3.2 (*)     Alkaline Phosphatase 120 (*)     All other components within normal limits  POCT I-STAT TROPONIN I  CULTURE, BLOOD (ROUTINE X 2)  CULTURE, BLOOD (ROUTINE X 2)  QUANTIFERON TB GOLD ASSAY  URINE RAPID DRUG SCREEN (HOSP PERFORMED)   Dg Chest 2 View  01/22/2013  *RADIOLOGY REPORT*  Clinical Data: Chest pain, cough, headaches normal cardiac silhouette and mediastinal contours.  CHEST - 2 VIEW  Comparison: 10/10/2007; 10/03/2007;  Findings: Grossly unchanged cardiac silhouette and mediastinal contours.  Interval development of air and fluid containing opacity within the right lung apex measuring approximately 4.0 x 4.1 cm. There is a smaller possible air fluid containing opacity within the expected location of the superior segment left lower lobe.  No pleural effusion or pneumothorax.  Unchanged bones.  IMPRESSION: Overall findings worrisome for multifocal possibly cavitary infection. Given upper lobe predominance, atypical etiologies are not excluded.  Further evaluation with  chest CT may be performed as clinically indicated.   Original Report Authenticated By: Tacey Ruiz, MD      1. Cavitary lesion of lung       MDM  Patient's chest x-ray demonstrates cavitary lesions in the lung suggesting a multifocal infection. TB cannot be excluded. Patient placed in isolation. I will order a TB test and proceed with a chest CT. Patient was started on antibiotics and I will consult with the medical service regarding admission for further evaluation.        Celene Kras, MD 01/22/13 2020  Celene Kras, MD 01/22/13 2040

## 2013-01-22 NOTE — ED Notes (Signed)
Pt c/o frontal headache and neck pain x 1 week.  She thought the pain was d/t an infection she had given herself d/t a nervous habit of "picking at her head".  She started taking some doxy she had on hand.  Yesterday she began feeling L-sided chest pain and today the pain in on both sides.  Pain increases with movement and if she lays on either side.  Pt anxious and crying.

## 2013-01-22 NOTE — H&P (Signed)
[facility] History and Physical  [name] ZOX:[id number] DOB: 04/09/1971 DOA: 01/22/2013  Referring physician: ED PCP: Acey Lav, MD  Specialists: None  Chief Complaint: Cough, chest pain  HPI: [name] is a 42 y.o. female who began to develop fever, chills, cough, 2 weeks ago.  Symptoms persisted and she also began to develop chest pain.  Cough is dry and non-productive, she does feel short of breath with coughing.  In the ED her CXR demonstrated cavitary lesions in the R lung apex that was not seen in 2008.  There is also a possible one located in the expected location of the superior segment of the left lower lobe.  She was given a dose of levaquin in the ED, patient placed on airborne isolation and hospitalist has been asked to admit.  Review of Systems: 12 systems reviewed and otherwise negative.  Past Medical History  Diagnosis Date  . History of MRSA infection   . ADHD (attention deficit hyperactivity disorder)   . Anxiety   . Depression    Past Surgical History  Procedure Date  . Back surgery    Social History:  reports that she has been smoking Cigarettes.  She has been smoking about .5 packs per day. She does not have any smokeless tobacco history on file. She reports that she does not drink alcohol or use illicit drugs.   Allergies  Allergen Reactions  . Penicillins Other (See Comments)    Childhood reaction    History reviewed. No pertinent family history.   Prior to Admission medications   Medication Sig Start Date End Date Taking? Authorizing Provider  amphetamine-dextroamphetamine (ADDERALL) 20 MG tablet Take 20 mg by mouth daily.   Yes Historical Provider, MD  Aspirin-Salicylamide-Caffeine (BC HEADACHE PO) Take 2 packets by mouth 3 (three) times daily as needed. For pain   Yes Historical Provider, MD  Cholecalciferol (VITAMIN D PO) Take 1 capsule by mouth daily.   Yes Historical Provider, MD  clonazePAM (KLONOPIN) 1 MG tablet Take 1  mg by mouth 2 (two) times daily as needed. For anxiety   Yes Historical Provider, MD  DULoxetine (CYMBALTA) 60 MG capsule Take 60 mg by mouth 2 (two) times daily.   Yes Historical Provider, MD   Physical Exam: Filed Vitals:   01/22/13 1722  BP: 107/68  Pulse: 91  Temp: 99.1 F (37.3 C)  TempSrc: Oral  Resp: 18  SpO2: 97%    General:  NAD, resting comfortably in bed Eyes: PEERLA EOMI ENT: mucous membranes moist Neck: supple w/o JVD Cardiovascular: RRR w/o MRG Respiratory: wheezes and rales B Abdomen: soft, nt, nd, bs+ Skin: no rash nor lesion Musculoskeletal: MAE, full ROM all 4 extremities Psychiatric: normal tone and affect Neurologic: AAOx3, grossly non-focal  Labs on Admission:  Basic Metabolic Panel:  Lab 01/22/13 8119  NA 135  K 4.0  CL 97  CO2 27  GLUCOSE 96  BUN 11  CREATININE 0.65  CALCIUM 9.6  MG --  PHOS --   Liver Function Tests:  Lab 01/22/13 1746  AST 12  ALT 9  ALKPHOS 120*  BILITOT 0.3  PROT 7.0  ALBUMIN 3.2*   No results found for this basename: LIPASE:5,AMYLASE:5 in the last 168 hours No results found for this basename: AMMONIA:5 in the last 168 hours CBC:  Lab 01/22/13 1746  WBC 12.4*  NEUTROABS --  HGB 13.0  HCT 37.0  MCV 92.5  PLT 427*   Cardiac Enzymes: No results found for  this basename: CKTOTAL:5,CKMB:5,CKMBINDEX:5,TROPONINI:5 in the last 168 hours  BNP (last 3 results) No results found for this basename: PROBNP:3 in the last 8760 hours CBG: No results found for this basename: GLUCAP:5 in the last 168 hours  Radiological Exams on Admission: Dg Chest 2 View  01/22/2013  *RADIOLOGY REPORT*  Clinical Data: Chest pain, cough, headaches normal cardiac silhouette and mediastinal contours.  CHEST - 2 VIEW  Comparison: 10/10/2007; 10/03/2007;  Findings: Grossly unchanged cardiac silhouette and mediastinal contours.  Interval development of air and fluid containing opacity within the right lung apex measuring approximately 4.0 x  4.1 cm. There is a smaller possible air fluid containing opacity within the expected location of the superior segment left lower lobe.  No pleural effusion or pneumothorax.  Unchanged bones.  IMPRESSION: Overall findings worrisome for multifocal possibly cavitary infection. Given upper lobe predominance, atypical etiologies are not excluded.  Further evaluation with chest CT may be performed as clinically indicated.   Original Report Authenticated By: Tacey Ruiz, MD     EKG: Independently reviewed.  Assessment/Plan Principal Problem:  *Cavitary lesion of lung   1. Cavitary lesion of lung - obviously quite concerning for TB or other mycobacterial infection given the 2 week course of symptoms, appearance on CXR in the apex of her lung.  Patient has been put on airborne isolation prior to my seeing her in the ED, will of course continue this, have informed patient of the reasons for this and the fact that at this point she could be very contagious.  ID has been consulted and will see patient tomorrow.  AFB sputum culture / smear ordered, quantiferon ordered.  Dr. Ninetta Lights spoke with on phone.  Code Status: Full Code (must indicate code status--if unknown or must be presumed, indicate so) Family Communication: No family in room, did inform patient that family members and close contacts would likely need to be screened for TB if she was found to have this (indicate person spoken with, if applicable, with phone number if by telephone) Disposition Plan: Admit to inpatient (indicate anticipated LOS)  Time spent: 70 min  GARDNER, JARED M. [facility] Pager [phone]  If 7PM-7AM, please contact night-coverage [email or web address] Password [facility] 01/22/2013, 9:44 PM

## 2013-01-23 ENCOUNTER — Encounter (HOSPITAL_COMMUNITY): Payer: Self-pay

## 2013-01-23 DIAGNOSIS — J9859 Other diseases of mediastinum, not elsewhere classified: Secondary | ICD-10-CM | POA: Diagnosis not present

## 2013-01-23 DIAGNOSIS — J984 Other disorders of lung: Principal | ICD-10-CM

## 2013-01-23 DIAGNOSIS — F909 Attention-deficit hyperactivity disorder, unspecified type: Secondary | ICD-10-CM | POA: Diagnosis present

## 2013-01-23 DIAGNOSIS — J679 Hypersensitivity pneumonitis due to unspecified organic dust: Secondary | ICD-10-CM | POA: Diagnosis not present

## 2013-01-23 DIAGNOSIS — M549 Dorsalgia, unspecified: Secondary | ICD-10-CM | POA: Diagnosis present

## 2013-01-23 DIAGNOSIS — F329 Major depressive disorder, single episode, unspecified: Secondary | ICD-10-CM | POA: Diagnosis present

## 2013-01-23 LAB — BASIC METABOLIC PANEL
BUN: 9 mg/dL (ref 6–23)
GFR calc Af Amer: >90 mL/min (ref >90)
GFR calc non Af Amer: >90 mL/min (ref >90)
Potassium: 4.2 mEq/L (ref 3.5–5.1)
Sodium: 131 mEq/L — ABNORMAL LOW (ref 135–145)

## 2013-01-23 LAB — CBC
MCHC: 35.6 g/dL (ref 30.0–36.0)
RDW: 12.4 % (ref 11.5–15.5)

## 2013-01-23 LAB — MRSA PCR SCREENING: MRSA by PCR: NEGATIVE

## 2013-01-23 MED ORDER — LEVOFLOXACIN IN D5W 750 MG/150ML IV SOLN
750.0000 mg | INTRAVENOUS | Status: DC
Start: 2013-01-23 — End: 2013-01-25
  Administered 2013-01-23 – 2013-01-24 (×2): 750 mg via INTRAVENOUS
  Filled 2013-01-23 (×4): qty 150

## 2013-01-23 MED ORDER — MORPHINE SULFATE 2 MG/ML IJ SOLN
1.0000 mg | INTRAMUSCULAR | Status: DC | PRN
  Administered 2013-01-23 – 2013-01-27 (×6): 2 mg via INTRAVENOUS
  Filled 2013-01-23 (×6): qty 1

## 2013-01-23 MED ORDER — HEPARIN SODIUM (PORCINE) 5000 UNIT/ML IJ SOLN
5000.0000 [IU] | Freq: Three times a day (TID) | INTRAMUSCULAR | Status: DC
  Administered 2013-01-23 – 2013-01-28 (×15): 5000 [IU] via SUBCUTANEOUS
  Filled 2013-01-23 (×20): qty 1

## 2013-01-23 NOTE — Consult Note (Signed)
[facility]    Date of Admission:  01/22/2013           Day 1 levofloxacin       Reason for Consult: Bilateral lung lesions, one cavitary    Referring Physician: Dr. Doran Stabler  Principal Problem:  *Cavitary lesion of lung Active Problems:  Intraspinal abscess  Hx of staphylococcal infection  Mediastinal mass  Pulmonary alveolitis  Depression  ADHD (attention deficit hyperactivity disorder)  Chronic back pain      . DULoxetine  60 mg Oral BID  . heparin  5,000 Units Subcutaneous Q8H  . levofloxacin (LEVAQUIN) IV  750 mg Intravenous Q24H    Recommendations: 1. Continue levofloxacin for now 2. Pain control and encourage her to cough so that we may be able to obtain expectorated sputum for analysis 3. Await results of blood cultures   Assessment: [name] has a subacute illness lasting 3-4 weeks and now presents with bilateral lung lesions. She has a right upper lobe cavitary lesion with air fluid level and a nodular left lower lobe lesion. Given that she has subjective fevers and pleurisy worry about septic pulmonary emboli right-sided endocarditis. Tuberculosis is also a concern given the 3-4 week illness and weight loss. Also, given the unusual history of infiltrates and mediastinal soft tissue mass in 2007 I would consider her some type of autoimmune, inflammatory process. Hopefully we can obtain sputum for analysis. If not, and blood cultures remain negative, we'll need to consider bronchoscopy.   HPI: [name] is a 42 y.o. female who started to feel bad 3-4 weeks ago. Initially she just felt weak tired that about 2 weeks ago she started to have persistent frontal headaches, cough and pleuritic chest pain. His also had anorexia and about a 5-6 pound unintentional weight loss. Initially she had some nausea but no vomiting. She has had subjective fevers along with shaking chills and sweats. She states that she feels like she has sputum that  she did cough up but coughing causes such severe pleuritic chest pain she tries to prevent it. She is currently not working. She has never lived out of [place] and has had any unusual travel. She has not had any significant animal exposure. She doesn't have any sick contacts. Her husband and 3 children are well. She has never been tested for tuberculosis and does not know of any exposures to anyone with tuberculosis.  2007 she was hospitalized with progressive respiratory failure and had bilateral honeycomb infiltrates and a mediastinal soft tissue mass. She was treated with empiric antibiotics and steroids for possible pneumonia and/or allergic alveolitis. She required intubation for a period of time. It appears that consideration was given to lung biopsy but this was never done and she was discharged on a steroid taper. It does not appear that she had any followup. She does not recall having any respiratory symptoms between that hospitalization and the onset of this illness. She does smoke cigarettes but stopped smoking several weeks ago when she started to become ill.   She also has chronic low back pain. She underwent lumbar surgery in 2008. Postoperatively she developed staph aureus wound infection with bacteremia and required 6 weeks of IV antibiotic therapy. She thinks her back pain may have been getting a little bit worse over the last few months. Right around the time that this illness began she was seen by one of her doctors at the pain clinic and was given a  pain patch which she never used. There was discussion about having her referred back to neurosurgery (? Dr. Jeral Fruit) for reevaluation of her degenerative spine disease.   Review of Systems: Pertinent items are noted in HPI.  Past Medical History  Diagnosis Date  . History of MRSA infection   . ADHD (attention deficit hyperactivity disorder)   . Anxiety   . Depression     History  Substance Use Topics  . Smoking status:  Current Some Day Smoker -- 0.5 packs/day    Types: Cigarettes  . Smokeless tobacco: Never Used  . Alcohol Use: No    History reviewed. No pertinent family history. Allergies  Allergen Reactions  . Penicillins Other (See Comments)    Childhood reaction    OBJECTIVE: Blood pressure 96/50, pulse 60, temperature 97.5 F (36.4 C), temperature source Oral, resp. rate 18, height 5\' 5"  (1.651 m), weight 51.71 kg (114 lb), SpO2 100.00%. General: She is thin and appears uncomfortable because of her cough and pain  Skin: No rash. She has a small healing burn at the base of her right index finger. She has no splinter or conjunctival hemorrhages   Lymph nodes: No palpable adenopathy  Oral: Teeth in good condition. No oropharyngeal lesions  Lungs: Clear without crackles or rubs  Cor: Regular S1 and S2 with no murmurs  Abdomen: Soft and nontender. I cannot feel a liver, spleen or other masses. She has normal bowel sounds. Joints and extremities: Normal Neuro: Alert and oriented x3. Neck supple.  Microbiology: Recent Results (from the past 240 hour(s))  MRSA PCR SCREENING     Status: Normal   Collection Time   01/23/13  1:27 AM      Component Value Range Status Comment   MRSA by PCR NEGATIVE  NEGATIVE Final     Cliffton Asters, MD [facility] [phone] pager   [phone] cell 01/23/2013, 3:34 PM

## 2013-01-23 NOTE — Progress Notes (Signed)
Utilization Review Completed.Jeselle Hiser T2/06/2013   

## 2013-01-23 NOTE — Progress Notes (Signed)
Pt. To be moved to 5507; report called to 5500. Will transfer pt.

## 2013-01-23 NOTE — Progress Notes (Signed)
Pt arrived to 5507 after negative pressure room was having problems on 4N. Pt alert and oriented and call bell within reach. VSS. Low fall risk.

## 2013-01-23 NOTE — Progress Notes (Signed)
[facility] PROGRESS NOTE  [name] EAV:[id number] DOB: [birth year] DOA: 01/22/2013 PCP: No primary provider on file. none  Assessment/Plan: 1. Right upper lobe Cavitary lung lesion, left lower lobe consolidation: Etiology unclear. No history of weight loss but has had chills and upper respiratory type symptoms for a few weeks. Concern for tuberculosis or atypical infection. Continue empiric Levaquin, airborne isolation. Infectious disease consulted. HIV nonreactive. See history below. If does not improve may need to consider biopsy. 2. Frontal headache: Improved. 3. ADHD: Continue Adderall. 4. Anxiety, depression: Continue Cymbalta and Klonopin. 5. History of superficial wound infection following lumbar decompression with associated staph bacteremia 2008. 6. 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 Anterior mediastinal mass, hilar lymphadenopathy and hepatosplenomegaly was noted. Followup CT was recommended but patient did not pursue.  Code Status: Full code Family Communication: None present Disposition Plan: Pending further evaluation  [name], MD  [facility] Team 5 Pager [phone] If 7PM-7AM, please contact night-coverage at [email or web address], password [facility] 01/23/2013, 8:45 AM  LOS: 1 day   Brief narrative: 42 y.o. female who began to develop fever, chills, cough, 2 weeks ago. Symptoms persisted and she also began to develop chest pain. Cough is dry and non-productive, she does feel short of breath with coughing.  In the ED her CXR demonstrated cavitary lesions in the R lung apex that was not seen in 2008. There is also a possible one located in the expected location of the superior segment of the left lower lobe. She was given a dose of levaquin in the ED, patient placed on airborne isolation and hospitalist has been asked to admit.  Consultants:  Infectious  disease  Procedures:    Antibiotics: Levaquin 2/6 >>  HPI/Subjective: Afebrile, borderline hypertension. No hypoxia or tachypnea. Complains of pain in right and left side of chest, cough with deep breath. Headache improved. No lower extremity weakness, paresthesias, bowel or bladder incontinence. No low back pain.  Objective: Filed Vitals:   01/22/13 1722 01/23/13 0005 01/23/13 0026 01/23/13 0702  BP: 107/68 93/53 95/54  100/60  Pulse: 91  65 66  Temp: 99.1 F (37.3 C)  98.3 F (36.8 C) 97.8 F (36.6 C)  TempSrc: Oral  Oral Oral  Resp: 18  17 16   Height:   5\' 5"  (1.651 m)   Weight:   51.71 kg (114 lb)   SpO2: 97%  98% 96%    Intake/Output Summary (Last 24 hours) at 01/23/13 0845 Last data filed at 01/23/13 0700  Gross per 24 hour  Intake    360 ml  Output      0 ml  Net    360 ml   Filed Weights   01/23/13 0026  Weight: 51.71 kg (114 lb)    Exam:  General:  Appears calm and comfortable Cardiovascular: RRR, no m/r/g. No LE edema. Respiratory: Bilateral anterior rhonchi. No wheezes or rales. Normal respiratory effort. Skin: no rash or induration seen on limited exam Musculoskeletal: grossly normal tone BUE/BLE Psychiatric: grossly normal mood and affect, speech fluent and appropriate  Data Reviewed: Basic Metabolic Panel:  Lab 01/23/13 1308 01/22/13 2140 01/22/13 1746  NA 131* -- 135  K 4.2 -- 4.0  CL 98 -- 97  CO2 24 -- 27  GLUCOSE 114* -- 96  BUN 9 -- 11  CREATININE 0.59 0.68 0.65  CALCIUM 9.3 -- 9.6  MG -- -- --  PHOS -- -- --   Liver Function Tests:  Lab 01/22/13 1746  AST 12  ALT 9  ALKPHOS 120*  BILITOT 0.3  PROT 7.0  ALBUMIN 3.2*   CBC:  Lab 01/23/13 0510 01/22/13 2140 01/22/13 1746  WBC 14.6* 12.3* 12.4*  NEUTROABS -- -- --  HGB 12.2 12.5 13.0  HCT 34.3* 36.4 37.0  MCV 92.0 93.1 92.5  PLT 370 390 427*     Recent Results (from the past 240 hour(s))  MRSA PCR SCREENING     Status: Normal   Collection Time   01/23/13  1:27 AM       Component Value Range Status Comment   MRSA by PCR NEGATIVE  NEGATIVE Final      Studies: Dg Chest 2 View  01/22/2013  *RADIOLOGY REPORT*  Clinical Data: Chest pain, cough, headaches normal cardiac silhouette and mediastinal contours.  CHEST - 2 VIEW  Comparison: 10/10/2007; 10/03/2007;  Findings: Grossly unchanged cardiac silhouette and mediastinal contours.  Interval development of air and fluid containing opacity within the right lung apex measuring approximately 4.0 x 4.1 cm. There is a smaller possible air fluid containing opacity within the expected location of the superior segment left lower lobe.  No pleural effusion or pneumothorax.  Unchanged bones.  IMPRESSION: Overall findings worrisome for multifocal possibly cavitary infection. Given upper lobe predominance, atypical etiologies are not excluded.  Further evaluation with chest CT may be performed as clinically indicated.   Original Report Authenticated By: Tacey Ruiz, MD    Ct Chest W Contrast  01/22/2013  *RADIOLOGY REPORT*  Clinical Data: Fever, chills, and flu-like symptoms beginning week ago.  Last night developed acute chest pain.  Dry cough.  Short of breath.  Headache.  Cavitary lesions on chest x-ray.  CT CHEST WITH CONTRAST  Technique:  Multidetector CT imaging of the chest was performed following the standard protocol during bolus administration of intravenous contrast.  Contrast: 80mL OMNIPAQUE IOHEXOL 300 MG/ML  SOLN  Comparison: CT chest 03/21/2006.  Chest x-ray 01/22/2013.  Findings: Since the previous study, there is interval development of a cavitary mass in the right upper lung measuring about 4.3 cm diameter.  An air-fluid level is present and there is infiltration around the mass.  Another focal area of consolidation with tiny gas collection is demonstrated in the superior segment of the left lower lung measuring 3.9 x 2 cm.  These changes were not present on the previous study.  This could represent focal consolidation  due to infectious process with necrotizing pneumonia, atypical infection such as TB, or multi focal mass lesions.  There are mild emphysematous changes in the apices.  No pleural effusions.  No significant lymphadenopathy in the chest.  Normal heart size. Normal caliber thoracic aorta.  Visualized portions of the upper abdominal organs appear unremarkable.  Degenerative changes in the thoracic spine.  No destructive or expansile bone lesions appreciated.  IMPRESSION: Cavitary mass or consolidation in the right upper lung.  Focal consolidation or mass in the superior segment of the left lower lung.  Differential diagnosis includes necrotizing pneumonia, atypical infectious process, or neoplasm.   Original Report Authenticated By: Burman Nieves, M.D.     Scheduled Meds:   . DULoxetine  60 mg Oral BID  . heparin  5,000 Units Subcutaneous Q8H   Continuous Infusions:   Principal Problem:  *Cavitary lesion of lung     [name], MD  [facility] Team 5 Pager [phone] If 7PM-7AM, please contact night-coverage at [email or web address], password [facility] 01/23/2013, 8:45 AM  LOS: 1 day  Time spent: 25 minutes

## 2013-01-24 DIAGNOSIS — F909 Attention-deficit hyperactivity disorder, unspecified type: Secondary | ICD-10-CM

## 2013-01-24 LAB — CBC
HCT: 34.5 % — ABNORMAL LOW (ref 36.0–46.0)
Hemoglobin: 11.6 g/dL — ABNORMAL LOW (ref 12.0–15.0)
MCHC: 33.6 g/dL (ref 30.0–36.0)
RBC: 3.71 MIL/uL — ABNORMAL LOW (ref 3.87–5.11)

## 2013-01-24 LAB — CRYPTOCOCCAL ANTIGEN

## 2013-01-24 NOTE — Progress Notes (Signed)
[facility] PROGRESS NOTE  [name] ZDG:[id number] DOB: July 17, 1971 DOA: 01/22/2013 PCP: No primary provider on file. None  Assessment/Plan: 1. Right upper lobe Cavitary lung lesion, left lower lobe consolidation: Remains afebrile, leukocytosis resolved. Etiology unclear. No history of weight loss but has had chills and upper respiratory type symptoms for a few weeks. Concern for tuberculosis or atypical infection. Continue empiric Levaquin, airborne isolation.  HIV nonreactive. See history below. If does not improve may need to consider bronchoscopy. 2. ADHD: Continue Adderall. 3. Anxiety, depression: Continue Cymbalta and Klonopin. 4. History of superficial wound infection following lumbar decompression with associated staph bacteremia 2008. 5. 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 Anterior mediastinal mass, hilar lymphadenopathy and hepatosplenomegaly was noted. Followup CT was recommended but patient did not pursue.  Code Status: Full code Family Communication: None present Disposition Plan: Home when improved.  [name], MD  [facility] Team 5 Pager [phone] If 7PM-7AM, please contact night-coverage at [email or web address], password [facility] 01/24/2013, 8:38 AM  LOS: 2 days   Brief narrative: 42 y.o. female who began to develop fever, chills, cough, 2 weeks ago. Symptoms persisted and she also began to develop chest pain. Cough is dry and non-productive, she does feel short of breath with coughing.  In the ED her CXR demonstrated cavitary lesions in the R lung apex that was not seen in 2008. There is also a possible one located in the expected location of the superior segment of the left lower lobe. She was given a dose of levaquin in the ED, patient placed on airborne isolation and hospitalist has been asked to admit.  Consultants:  Infectious  disease  Procedures:    Antibiotics: Levaquin 2/6 >>  HPI/Subjective: No issues overnight. Coughs only with movement. Still has chest pain. No sputum.  Objective: Filed Vitals:   01/23/13 1433 01/23/13 1602 01/23/13 2134 01/24/13 0538  BP: 96/50 97/50 88/51  107/66  Pulse: 60 64 58 66  Temp: 97.5 F (36.4 C) 98.2 F (36.8 C) 97.9 F (36.6 C) 98.7 F (37.1 C)  TempSrc: Oral Oral Oral Oral  Resp: 18 18 16 18   Height:  5\' 5"  (1.651 m)    Weight:  48.716 kg (107 lb 6.4 oz)    SpO2: 100% 98% 98% 97%    Intake/Output Summary (Last 24 hours) at 01/24/13 0838 Last data filed at 01/23/13 1300  Gross per 24 hour  Intake    240 ml  Output      0 ml  Net    240 ml   Filed Weights   01/23/13 0026 01/23/13 1602  Weight: 51.71 kg (114 lb) 48.716 kg (107 lb 6.4 oz)    Exam:  General:  Appears calm and comfortable Cardiovascular: RRR, no m/r/g. No LE edema. Respiratory: Clear to auscultation bilaterally. No wheezes, rhonchi or rales. Normal respiratory effort. Psychiatric: grossly normal mood and affect, speech fluent and appropriate  Data Reviewed: Basic Metabolic Panel:  Recent Labs Lab 01/22/13 1746 01/22/13 2140 01/23/13 0510  NA 135  --  131*  K 4.0  --  4.2  CL 97  --  98  CO2 27  --  24  GLUCOSE 96  --  114*  BUN 11  --  9  CREATININE 0.65 0.68 0.59  CALCIUM 9.6  --  9.3   Liver Function Tests:  Recent Labs Lab 01/22/13 1746  AST 12  ALT 9  ALKPHOS 120*  BILITOT 0.3  PROT 7.0  ALBUMIN 3.2*   CBC:  Recent Labs Lab 01/22/13 1746 01/22/13 2140 01/23/13 0510 01/24/13 0544  WBC 12.4* 12.3* 14.6* 7.4  HGB 13.0 12.5 12.2 11.6*  HCT 37.0 36.4 34.3* 34.5*  MCV 92.5 93.1 92.0 93.0  PLT 427* 390 370 394     Recent Results (from the past 240 hour(s))  MRSA PCR SCREENING     Status: None   Collection Time    01/23/13  1:27 AM      Result Value Range Status   MRSA by PCR NEGATIVE  NEGATIVE Final   Comment:            The GeneXpert MRSA Assay  (FDA     approved for NASAL specimens     only), is one component of a     comprehensive MRSA colonization     surveillance program. It is not     intended to diagnose MRSA     infection nor to guide or     monitor treatment for     MRSA infections.     Studies: Dg Chest 2 View  01/22/2013  *RADIOLOGY REPORT*  Clinical Data: Chest pain, cough, headaches normal cardiac silhouette and mediastinal contours.  CHEST - 2 VIEW  Comparison: 10/10/2007; 10/03/2007;  Findings: Grossly unchanged cardiac silhouette and mediastinal contours.  Interval development of air and fluid containing opacity within the right lung apex measuring approximately 4.0 x 4.1 cm. There is a smaller possible air fluid containing opacity within the expected location of the superior segment left lower lobe.  No pleural effusion or pneumothorax.  Unchanged bones.  IMPRESSION: Overall findings worrisome for multifocal possibly cavitary infection. Given upper lobe predominance, atypical etiologies are not excluded.  Further evaluation with chest CT may be performed as clinically indicated.   Original Report Authenticated By: Tacey Ruiz, MD    Ct Chest W Contrast  01/22/2013  *RADIOLOGY REPORT*  Clinical Data: Fever, chills, and flu-like symptoms beginning week ago.  Last night developed acute chest pain.  Dry cough.  Short of breath.  Headache.  Cavitary lesions on chest x-ray.  CT CHEST WITH CONTRAST  Technique:  Multidetector CT imaging of the chest was performed following the standard protocol during bolus administration of intravenous contrast.  Contrast: 80mL OMNIPAQUE IOHEXOL 300 MG/ML  SOLN  Comparison: CT chest 03/21/2006.  Chest x-ray 01/22/2013.  Findings: Since the previous study, there is interval development of a cavitary mass in the right upper lung measuring about 4.3 cm diameter.  An air-fluid level is present and there is infiltration around the mass.  Another focal area of consolidation with tiny gas collection is  demonstrated in the superior segment of the left lower lung measuring 3.9 x 2 cm.  These changes were not present on the previous study.  This could represent focal consolidation due to infectious process with necrotizing pneumonia, atypical infection such as TB, or multi focal mass lesions.  There are mild emphysematous changes in the apices.  No pleural effusions.  No significant lymphadenopathy in the chest.  Normal heart size. Normal caliber thoracic aorta.  Visualized portions of the upper abdominal organs appear unremarkable.  Degenerative changes in the thoracic spine.  No destructive or expansile bone lesions appreciated.  IMPRESSION: Cavitary mass or consolidation in the right upper lung.  Focal consolidation or mass in the superior segment of the left lower lung.  Differential diagnosis includes necrotizing pneumonia, atypical infectious process, or neoplasm.   Original Report Authenticated By:  Burman Nieves, M.D.     Scheduled Meds: . DULoxetine  60 mg Oral BID  . heparin  5,000 Units Subcutaneous Q8H  . levofloxacin (LEVAQUIN) IV  750 mg Intravenous Q24H   Continuous Infusions:   Principal Problem:   Cavitary lesion of lung Active Problems:   Intraspinal abscess   Hx of staphylococcal infection   Mediastinal mass   Pulmonary alveolitis   Depression   ADHD (attention deficit hyperactivity disorder)   Chronic back pain     [name], MD  [facility] Team 5 Pager [phone] If 7PM-7AM, please contact night-coverage at [email or web address], password [facility] 01/24/2013, 8:38 AM  LOS: 2 days   Time spent: 15 minutes

## 2013-01-24 NOTE — Progress Notes (Addendum)
INFECTIOUS DISEASE PROGRESS NOTE  ID: [name] is a 42 y.o. female with   Principal Problem:   Cavitary lesion of lung Active Problems:   Intraspinal abscess   Hx of staphylococcal infection   Mediastinal mass   Pulmonary alveolitis   Depression   ADHD (attention deficit hyperactivity disorder)   Chronic back pain  Subjective: C/o pleuritic pain. Non-productive vcough  Abtx:  Anti-infectives   Start     Dose/Rate Route Frequency Ordered Stop   01/23/13 2200  levofloxacin (LEVAQUIN) IVPB 750 mg    Comments:  After cultures   750 mg 100 mL/hr over 90 Minutes Intravenous Every 24 hours 01/23/13 0917     01/22/13 2030  levofloxacin (LEVAQUIN) IVPB 750 mg    Comments:  After cultures   750 mg 100 mL/hr over 90 Minutes Intravenous  Once 01/22/13 2017 01/22/13 2230      Medications:  Scheduled: . DULoxetine  60 mg Oral BID  . heparin  5,000 Units Subcutaneous Q8H  . levofloxacin (LEVAQUIN) IV  750 mg Intravenous Q24H    Objective: Vital signs in last 24 hours: Temp:  [97.5 F (36.4 C)-98.7 F (37.1 C)] 98.7 F (37.1 C) (02/08 0538) Pulse Rate:  [58-66] 66 (02/08 0538) Resp:  [16-18] 18 (02/08 0538) BP: (88-107)/(50-66) 107/66 mmHg (02/08 0538) SpO2:  [97 %-100 %] 97 % (02/08 0538) Weight:  [48.716 kg (107 lb 6.4 oz)] 48.716 kg (107 lb 6.4 oz) (02/07 1602)   General appearance: alert, cooperative and no distress Neck: no adenopathy and supple, symmetrical, trachea midline Resp: rhonchi bilaterally and mild Cardio: regular rate and rhythm GI: normal findings: bowel sounds normal and soft, non-tender Extremities: edema none Lymph nodes: Cervical, supraclavicular, and axillary nodes normal.  Lab Results  Recent Labs  01/22/13 1746 01/22/13 2140 01/23/13 0510 01/24/13 0544  WBC 12.4* 12.3* 14.6* 7.4  HGB 13.0 12.5 12.2 11.6*  HCT 37.0 36.4 34.3* 34.5*  NA 135  --  131*  --   K 4.0  --  4.2  --   CL 97  --  98  --   CO2 27  --  24  --   BUN 11   --  9  --   CREATININE 0.65 0.68 0.59  --    Liver Panel  Recent Labs  01/22/13 1746  PROT 7.0  ALBUMIN 3.2*  AST 12  ALT 9  ALKPHOS 120*  BILITOT 0.3   Sedimentation Rate No results found for this basename: ESRSEDRATE,  in the last 72 hours C-Reactive Protein No results found for this basename: CRP,  in the last 72 hours  Microbiology: Recent Results (from the past 240 hour(s))  MRSA PCR SCREENING     Status: None   Collection Time    01/23/13  1:27 AM      Result Value Range Status   MRSA by PCR NEGATIVE  NEGATIVE Final   Comment:            The GeneXpert MRSA Assay (FDA     approved for NASAL specimens     only), is one component of a     comprehensive MRSA colonization     surveillance program. It is not     intended to diagnose MRSA     infection nor to guide or     monitor treatment for     MRSA infections.    Studies/Results: Dg Chest 2 View  01/22/2013  *RADIOLOGY REPORT*  Clinical Data: Chest  pain, cough, headaches normal cardiac silhouette and mediastinal contours.  CHEST - 2 VIEW  Comparison: 10/10/2007; 10/03/2007;  Findings: Grossly unchanged cardiac silhouette and mediastinal contours.  Interval development of air and fluid containing opacity within the right lung apex measuring approximately 4.0 x 4.1 cm. There is a smaller possible air fluid containing opacity within the expected location of the superior segment left lower lobe.  No pleural effusion or pneumothorax.  Unchanged bones.  IMPRESSION: Overall findings worrisome for multifocal possibly cavitary infection. Given upper lobe predominance, atypical etiologies are not excluded.  Further evaluation with chest CT may be performed as clinically indicated.   Original Report Authenticated By: Tacey Ruiz, MD    Ct Chest W Contrast  01/22/2013  *RADIOLOGY REPORT*  Clinical Data: Fever, chills, and flu-like symptoms beginning week ago.  Last night developed acute chest pain.  Dry cough.  Short of breath.   Headache.  Cavitary lesions on chest x-ray.  CT CHEST WITH CONTRAST  Technique:  Multidetector CT imaging of the chest was performed following the standard protocol during bolus administration of intravenous contrast.  Contrast: 80mL OMNIPAQUE IOHEXOL 300 MG/ML  SOLN  Comparison: CT chest 03/21/2006.  Chest x-ray 01/22/2013.  Findings: Since the previous study, there is interval development of a cavitary mass in the right upper lung measuring about 4.3 cm diameter.  An air-fluid level is present and there is infiltration around the mass.  Another focal area of consolidation with tiny gas collection is demonstrated in the superior segment of the left lower lung measuring 3.9 x 2 cm.  These changes were not present on the previous study.  This could represent focal consolidation due to infectious process with necrotizing pneumonia, atypical infection such as TB, or multi focal mass lesions.  There are mild emphysematous changes in the apices.  No pleural effusions.  No significant lymphadenopathy in the chest.  Normal heart size. Normal caliber thoracic aorta.  Visualized portions of the upper abdominal organs appear unremarkable.  Degenerative changes in the thoracic spine.  No destructive or expansile bone lesions appreciated.  IMPRESSION: Cavitary mass or consolidation in the right upper lung.  Focal consolidation or mass in the superior segment of the left lower lung.  Differential diagnosis includes necrotizing pneumonia, atypical infectious process, or neoplasm.   Original Report Authenticated By: Burman Nieves, M.D.      Assessment/Plan: RUL cavity Total days of antibiotics: 3 (leavquin) Agree, if she is unable to produce sputum would consider Bronch/BAL.  Will continue to f/uher Cx.  HIV (-) cound consider checking histo, crytpo, qunatiferon gold  Johny Sax [facility] [phone] 01/24/2013, 12:23 PM   LOS: 2 days

## 2013-01-25 LAB — CBC
MCV: 93.1 fL (ref 78.0–100.0)
Platelets: 409 10*3/uL — ABNORMAL HIGH (ref 150–400)
RBC: 3.78 MIL/uL — ABNORMAL LOW (ref 3.87–5.11)
WBC: 6.1 10*3/uL (ref 4.0–10.5)

## 2013-01-25 MED ORDER — LEVOFLOXACIN 750 MG PO TABS
750.0000 mg | ORAL_TABLET | Freq: Every day | ORAL | Status: DC
  Administered 2013-01-25 – 2013-01-27 (×3): 750 mg via ORAL
  Filled 2013-01-25 (×4): qty 1

## 2013-01-25 NOTE — Progress Notes (Signed)
INFECTIOUS DISEASE PROGRESS NOTE  ID: [name] is a 42 y.o. female with  * Principal Problem:   Cavitary lesion of lung Active Problems:   Intraspinal abscess   Hx of staphylococcal infection   Mediastinal mass   Pulmonary alveolitis   Depression   ADHD (attention deficit hyperactivity disorder)   Chronic back pain  Subjective: Resting quietly.  Abtx:  Anti-infectives   Start     Dose/Rate Route Frequency Ordered Stop   01/23/13 2200  levofloxacin (LEVAQUIN) IVPB 750 mg    Comments:  After cultures   750 mg 100 mL/hr over 90 Minutes Intravenous Every 24 hours 01/23/13 0917     01/22/13 2030  levofloxacin (LEVAQUIN) IVPB 750 mg    Comments:  After cultures   750 mg 100 mL/hr over 90 Minutes Intravenous  Once 01/22/13 2017 01/22/13 2230      Medications:  Scheduled: . DULoxetine  60 mg Oral BID  . heparin  5,000 Units Subcutaneous Q8H  . levofloxacin (LEVAQUIN) IV  750 mg Intravenous Q24H    Objective: Vital signs in last 24 hours: Temp:  [97.8 F (36.6 C)-98.2 F (36.8 C)] 97.8 F (36.6 C) (02/09 0525) Pulse Rate:  [58-64] 58 (02/09 0525) Resp:  [18-20] 20 (02/09 0525) BP: (95-100)/(52-60) 100/60 mmHg (02/09 0525) SpO2:  [97 %-99 %] 99 % (02/09 0525)   General appearance: no distress and resting quietly  Lab Results  Recent Labs  01/22/13 1746 01/22/13 2140 01/23/13 0510 01/24/13 0544 01/25/13 0430  WBC 12.4* 12.3* 14.6* 7.4 6.1  HGB 13.0 12.5 12.2 11.6* 12.0  HCT 37.0 36.4 34.3* 34.5* 35.2*  NA 135  --  131*  --   --   K 4.0  --  4.2  --   --   CL 97  --  98  --   --   CO2 27  --  24  --   --   BUN 11  --  9  --   --   CREATININE 0.65 0.68 0.59  --   --    Liver Panel  Recent Labs  01/22/13 1746  PROT 7.0  ALBUMIN 3.2*  AST 12  ALT 9  ALKPHOS 120*  BILITOT 0.3   Sedimentation Rate No results found for this basename: ESRSEDRATE,  in the last 72 hours C-Reactive Protein No results found for this basename: CRP,  in the last  72 hours  Microbiology: Recent Results (from the past 240 hour(s))  CULTURE, BLOOD (ROUTINE X 2)     Status: None   Collection Time    01/22/13  8:30 PM      Result Value Range Status   Specimen Description BLOOD ARM RIGHT   Final   Special Requests BOTTLES DRAWN AEROBIC AND ANAEROBIC 10CC EACH   Final   Culture  Setup Time 01/23/2013 03:21   Final   Culture     Final   Value:        BLOOD CULTURE RECEIVED NO GROWTH TO DATE CULTURE WILL BE HELD FOR 5 DAYS BEFORE ISSUING A FINAL NEGATIVE REPORT   Report Status PENDING   Incomplete  CULTURE, BLOOD (ROUTINE X 2)     Status: None   Collection Time    01/22/13  8:40 PM      Result Value Range Status   Specimen Description BLOOD HAND RIGHT   Final   Special Requests BOTTLES DRAWN AEROBIC AND ANAEROBIC [facility]   Final   Culture  Setup  Time 01/23/2013 03:20   Final   Culture     Final   Value:        BLOOD CULTURE RECEIVED NO GROWTH TO DATE CULTURE WILL BE HELD FOR 5 DAYS BEFORE ISSUING A FINAL NEGATIVE REPORT   Report Status PENDING   Incomplete  MRSA PCR SCREENING     Status: None   Collection Time    01/23/13  1:27 AM      Result Value Range Status   MRSA by PCR NEGATIVE  NEGATIVE Final   Comment:            The GeneXpert MRSA Assay (FDA     approved for NASAL specimens     only), is one component of a     comprehensive MRSA colonization     surveillance program. It is not     intended to diagnose MRSA     infection nor to guide or     monitor treatment for     MRSA infections.    Studies/Results: No results found.   Assessment/Plan: RUL cavity Total days of antibiotics: 4 Would consider pulmonary eval in AM for BAL Pending- histo, quantiferon gold.           [name] Infectious Diseases [phone] 01/25/2013, 11:33 AM   LOS: 3 days

## 2013-01-25 NOTE — Progress Notes (Signed)
[facility] PROGRESS NOTE  [name] UJW:[id number] DOB: 24-May-1971 DOA: 01/22/2013 PCP: No primary provider on file. None  Assessment/Plan: 1. Right upper lobe Cavitary lung lesion, left lower lobe consolidation: Remains afebrile, leukocytosis resolved. Etiology unclear. Concern for tuberculosis or atypical infection. Continue empiric Levaquin, airborne isolation.  HIV nonreactive. Discussed with Dr. Ninetta Lights, will consult pulmonology for consideration of bronchoscopy.  2. ADHD: Continue Adderall. 3. Anxiety, depression: Continue Cymbalta and Klonopin. 4. History of superficial wound infection following lumbar decompression with associated staph bacteremia 2008. 5. 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 Anterior mediastinal mass, hilar lymphadenopathy and hepatosplenomegaly was noted. Followup CT was recommended but patient did not pursue.  Code Status: Full code Family Communication: None present Disposition Plan: Home when improved.  [name], MD  [facility] Team 5 Pager [phone] If 7PM-7AM, please contact night-coverage at [email or web address], password [facility] 01/25/2013, 12:23 PM  LOS: 3 days   Brief narrative: 42 y.o. [name] who began to develop fever, chills, cough, 2 weeks ago. Symptoms persisted and she also began to develop chest pain. Cough is dry and non-productive, she does feel short of breath with coughing.  In the ED her CXR demonstrated cavitary lesions in the R lung apex that was not seen in 2008. There is also a possible one located in the expected location of the superior segment of the left lower lobe. She was given a dose of levaquin in the ED, patient placed on airborne isolation and hospitalist has been asked to admit.  Consultants:  Infectious disease  Procedures:    Antibiotics: Levaquin 2/6 >>  HPI/Subjective: Remains afebrile. Some cough, nonproductive.  Last chest pain.  Objective: Filed Vitals:   01/24/13 0538 01/24/13 1514 01/24/13 2109 01/25/13 0525  BP: 107/66 95/60 95/52  100/60  Pulse: 66 64 58 58  Temp: 98.7 F (37.1 C) 98.2 F (36.8 C) 98 F (36.7 C) 97.8 F (36.6 C)  TempSrc: Oral Oral Oral Oral  Resp: 18 20 18 20   Height:      Weight:      SpO2: 97% 98% 97% 99%    Intake/Output Summary (Last 24 hours) at 01/25/13 1223 Last data filed at 01/25/13 0900  Gross per 24 hour  Intake   1128 ml  Output      0 ml  Net   1128 ml   Filed Weights   01/23/13 0026 01/23/13 1602  Weight: 51.71 kg (114 lb) 48.716 kg (107 lb 6.4 oz)    Exam:  General:  Appears calm and comfortable Cardiovascular: RRR, no m/r/g. No LE edema. Respiratory: Clear to auscultation bilaterally. No wheezes, rhonchi or rales. Normal respiratory effort. Psychiatric: grossly normal mood and affect, speech fluent and appropriate  Exam current 2/9  Data Reviewed: Basic Metabolic Panel:  Recent Labs Lab 01/22/13 1746 01/22/13 2140 01/23/13 0510  NA 135  --  131*  K 4.0  --  4.2  CL 97  --  98  CO2 27  --  24  GLUCOSE 96  --  114*  BUN 11  --  9  CREATININE 0.65 0.68 0.59  CALCIUM 9.6  --  9.3   Liver Function Tests:  Recent Labs Lab 01/22/13 1746  AST 12  ALT 9  ALKPHOS 120*  BILITOT 0.3  PROT 7.0  ALBUMIN 3.2*   CBC:  Recent Labs Lab 01/22/13 1746 01/22/13 2140 01/23/13 0510 01/24/13 0544 01/25/13 0430  WBC 12.4* 12.3* 14.6* 7.4 6.1  HGB  13.0 12.5 12.2 11.6* 12.0  HCT 37.0 36.4 34.3* 34.5* 35.2*  MCV 92.5 93.1 92.0 93.0 93.1  PLT 427* 390 370 394 409*     Recent Results (from the past 240 hour(s))  CULTURE, BLOOD (ROUTINE X 2)     Status: None   Collection Time    01/22/13  8:30 PM      Result Value Range Status   Specimen Description BLOOD ARM RIGHT   Final   Special Requests BOTTLES DRAWN AEROBIC AND ANAEROBIC 10CC EACH   Final   Culture  Setup Time 01/23/2013 03:21   Final   Culture     Final   Value:         BLOOD CULTURE RECEIVED NO GROWTH TO DATE CULTURE WILL BE HELD FOR 5 DAYS BEFORE ISSUING A FINAL NEGATIVE REPORT   Report Status PENDING   Incomplete  CULTURE, BLOOD (ROUTINE X 2)     Status: None   Collection Time    01/22/13  8:40 PM      Result Value Range Status   Specimen Description BLOOD HAND RIGHT   Final   Special Requests BOTTLES DRAWN AEROBIC AND ANAEROBIC [facility]   Final   Culture  Setup Time 01/23/2013 03:20   Final   Culture     Final   Value:        BLOOD CULTURE RECEIVED NO GROWTH TO DATE CULTURE WILL BE HELD FOR 5 DAYS BEFORE ISSUING A FINAL NEGATIVE REPORT   Report Status PENDING   Incomplete  MRSA PCR SCREENING     Status: None   Collection Time    01/23/13  1:27 AM      Result Value Range Status   MRSA by PCR NEGATIVE  NEGATIVE Final   Comment:            The GeneXpert MRSA Assay (FDA     approved for NASAL specimens     only), is one component of a     comprehensive MRSA colonization     surveillance program. It is not     intended to diagnose MRSA     infection nor to guide or     monitor treatment for     MRSA infections.     Studies: No results found.  Scheduled Meds: . DULoxetine  60 mg Oral BID  . heparin  5,000 Units Subcutaneous Q8H  . levofloxacin (LEVAQUIN) IV  750 mg Intravenous Q24H   Continuous Infusions:   Principal Problem:   Cavitary lesion of lung Active Problems:   Intraspinal abscess   Hx of staphylococcal infection   Mediastinal mass   Pulmonary alveolitis   Depression   ADHD (attention deficit hyperactivity disorder)   Chronic back pain     [name], MD  [facility] Team 5 Pager [phone] If 7PM-7AM, please contact night-coverage at [email or web address], password [facility] 01/25/2013, 12:23 PM  LOS: 3 days   Time spent: 15 minutes

## 2013-01-26 ENCOUNTER — Inpatient Hospital Stay (HOSPITAL_COMMUNITY): Payer: Medicare Other

## 2013-01-26 DIAGNOSIS — J852 Abscess of lung without pneumonia: Secondary | ICD-10-CM

## 2013-01-26 DIAGNOSIS — J189 Pneumonia, unspecified organism: Secondary | ICD-10-CM

## 2013-01-26 MED ORDER — TUBERCULIN PPD 5 UNIT/0.1ML ID SOLN
5.0000 [IU] | Freq: Once | INTRADERMAL | Status: AC
  Administered 2013-01-26: 5 [IU] via INTRADERMAL
  Filled 2013-01-26: qty 0.1

## 2013-01-26 MED ORDER — OXYCODONE HCL 5 MG PO TABS
5.0000 mg | ORAL_TABLET | ORAL | Status: DC | PRN
  Administered 2013-01-27 – 2013-01-28 (×5): 5 mg via ORAL
  Filled 2013-01-26 (×5): qty 1

## 2013-01-26 MED ORDER — OXYCODONE-ACETAMINOPHEN 5-325 MG PO TABS: 1.0000 | ORAL_TABLET | ORAL | Status: DC | PRN

## 2013-01-26 MED ORDER — ENSURE COMPLETE PO LIQD
237.0000 mL | ORAL | Status: DC
  Administered 2013-01-26 – 2013-01-27 (×2): 237 mL via ORAL

## 2013-01-26 NOTE — Progress Notes (Signed)
[facility] PROGRESS NOTE  [name] ZOX:[id number] DOB: [birth year] DOA: 01/22/2013 PCP: No primary provider on file. None  Assessment/Plan: 1. Right upper lobe Cavitary lung lesion, left lower lobe consolidation: Remains afebrile, leukocytosis resolved. Etiology unclear. Concern for tuberculosis or atypical infection. Continue empiric Levaquin, airborne isolation.  HIV nonreactive. Pulmonary consultation today for consideration of bronchoscopy. Adjust narcotics for improved pain control 2. ADHD: Continue Adderall. 3. Anxiety, depression: Continue Cymbalta and Klonopin. 4. History of superficial wound infection following lumbar decompression with associated staph bacteremia 2008. 5. 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 Anterior mediastinal mass, hilar lymphadenopathy and hepatosplenomegaly was noted. Followup CT was recommended but patient did not pursue.  Code Status: Full code Family Communication: None present Disposition Plan: Home when improved.  [name], MD  [facility] Team 5 Pager [phone] If 7PM-7AM, please contact night-coverage at [email or web address], password [facility] 01/26/2013, 11:46 AM  LOS: 4 days   Brief narrative: 42 y.o. female who began to develop fever, chills, cough, 2 weeks ago. Symptoms persisted and she also began to develop chest pain. Cough is dry and non-productive, she does feel short of breath with coughing.  In the ED her CXR demonstrated cavitary lesions in the R lung apex that was not seen in 2008. There is also a possible one located in the expected location of the superior segment of the left lower lobe. She was given a dose of levaquin in the ED, patient placed on airborne isolation and hospitalist has been asked to admit.  Consultants:  Infectious disease  Procedures:    Antibiotics: Levaquin 2/6 >>  HPI/Subjective: Left-sided chest pain has  resolved. Still has significant right upper chest pain, Lortab ineffective.  Objective: Filed Vitals:   01/25/13 0525 01/25/13 1349 01/25/13 2230 01/26/13 0523  BP: 100/60 96/54 95/60  106/65  Pulse: 58 58 68 62  Temp: 97.8 F (36.6 C) 99 F (37.2 C) 98 F (36.7 C) 97.7 F (36.5 C)  TempSrc: Oral Oral Oral Oral  Resp: 20 18 16 16   Height:      Weight:      SpO2: 99% 99% 97% 97%    Intake/Output Summary (Last 24 hours) at 01/26/13 1146 Last data filed at 01/26/13 0900  Gross per 24 hour  Intake   1822 ml  Output      0 ml  Net   1822 ml   Filed Weights   01/23/13 0026 01/23/13 1602  Weight: 51.71 kg (114 lb) 48.716 kg (107 lb 6.4 oz)    Exam:  General:  Appears calm and comfortable, overall appears better. Cardiovascular: RRR, no m/r/g. No LE edema. Respiratory: Clear to auscultation bilaterally. No wheezes, rhonchi or rales. Normal respiratory effort.   Data Reviewed: Basic Metabolic Panel:  Recent Labs Lab 01/22/13 1746 01/22/13 2140 01/23/13 0510  NA 135  --  131*  K 4.0  --  4.2  CL 97  --  98  CO2 27  --  24  GLUCOSE 96  --  114*  BUN 11  --  9  CREATININE 0.65 0.68 0.59  CALCIUM 9.6  --  9.3   Liver Function Tests:  Recent Labs Lab 01/22/13 1746  AST 12  ALT 9  ALKPHOS 120*  BILITOT 0.3  PROT 7.0  ALBUMIN 3.2*   CBC:  Recent Labs Lab 01/22/13 1746 01/22/13 2140 01/23/13 0510 01/24/13 0544 01/25/13 0430  WBC 12.4* 12.3* 14.6* 7.4 6.1  HGB 13.0 12.5 12.2  11.6* 12.0  HCT 37.0 36.4 34.3* 34.5* 35.2*  MCV 92.5 93.1 92.0 93.0 93.1  PLT 427* 390 370 394 409*     Recent Results (from the past 240 hour(s))  CULTURE, BLOOD (ROUTINE X 2)     Status: None   Collection Time    01/22/13  8:30 PM      Result Value Range Status   Specimen Description BLOOD ARM RIGHT   Final   Special Requests BOTTLES DRAWN AEROBIC AND ANAEROBIC 10CC EACH   Final   Culture  Setup Time 01/23/2013 03:21   Final   Culture     Final   Value:        BLOOD  CULTURE RECEIVED NO GROWTH TO DATE CULTURE WILL BE HELD FOR 5 DAYS BEFORE ISSUING A FINAL NEGATIVE REPORT   Report Status PENDING   Incomplete  CULTURE, BLOOD (ROUTINE X 2)     Status: None   Collection Time    01/22/13  8:40 PM      Result Value Range Status   Specimen Description BLOOD HAND RIGHT   Final   Special Requests BOTTLES DRAWN AEROBIC AND ANAEROBIC [facility]   Final   Culture  Setup Time 01/23/2013 03:20   Final   Culture     Final   Value:        BLOOD CULTURE RECEIVED NO GROWTH TO DATE CULTURE WILL BE HELD FOR 5 DAYS BEFORE ISSUING A FINAL NEGATIVE REPORT   Report Status PENDING   Incomplete  MRSA PCR SCREENING     Status: None   Collection Time    01/23/13  1:27 AM      Result Value Range Status   MRSA by PCR NEGATIVE  NEGATIVE Final   Comment:            The GeneXpert MRSA Assay (FDA     approved for NASAL specimens     only), is one component of a     comprehensive MRSA colonization     surveillance program. It is not     intended to diagnose MRSA     infection nor to guide or     monitor treatment for     MRSA infections.     Studies: No results found.  Scheduled Meds: . DULoxetine  60 mg Oral BID  . heparin  5,000 Units Subcutaneous Q8H  . levofloxacin  750 mg Oral QHS  . tuberculin  5 Units Intradermal Once   Continuous Infusions:   Principal Problem:   Cavitary lesion of lung Active Problems:   Intraspinal abscess   Hx of staphylococcal infection   Mediastinal mass   Pulmonary alveolitis   Depression   ADHD (attention deficit hyperactivity disorder)   Chronic back pain     [name], MD  [facility] Team 5 Pager [phone] If 7PM-7AM, please contact night-coverage at [email or web address], password [facility] 01/26/2013, 11:46 AM  LOS: 4 days   Time spent: 15 minutes

## 2013-01-26 NOTE — Progress Notes (Signed)
Patient ID: [name], female   DOB: 1971/02/18, 42 y.o.   MRN: [id number]         [facility]    Date of Admission:  01/22/2013           Day 4 levofloxacin  Principal Problem:   Cavitary lesion of lung Active Problems:   Intraspinal abscess   Hx of staphylococcal infection   Mediastinal mass   Pulmonary alveolitis   Depression   ADHD (attention deficit hyperactivity disorder)   Chronic back pain   . DULoxetine  60 mg Oral BID  . heparin  5,000 Units Subcutaneous Q8H  . levofloxacin  750 mg Oral QHS  . tuberculin  5 Units Intradermal Once    Subjective: She is feeling a little bit better. She still has right anterior pleuritic chest pain and dry cough.  Objective: Temp:  [97.7 F (36.5 C)-99 F (37.2 C)] 97.7 F (36.5 C) (02/10 0523) Pulse Rate:  [58-68] 62 (02/10 0523) Resp:  [16-18] 16 (02/10 0523) BP: (95-106)/(54-65) 106/65 mmHg (02/10 0523) SpO2:  [97 %-99 %] 97 % (02/10 0523)  General: Alert and comfortable talking with a friend Skin: No rash Lymph nodes: No palpable adenopathy Lungs: Clear except for a few wheezes on the right Cor: Regular S1-S2 no murmurs  Lab Results Lab Results  Component Value Date   WBC 6.1 01/25/2013   HGB 12.0 01/25/2013   HCT 35.2* 01/25/2013   MCV 93.1 01/25/2013   PLT 409* 01/25/2013    Lab Results  Component Value Date   CREATININE 0.59 01/23/2013   BUN 9 01/23/2013   NA 131* 01/23/2013   K 4.2 01/23/2013   CL 98 01/23/2013   CO2 24 01/23/2013    Lab Results  Component Value Date   ALT 9 01/22/2013   AST 12 01/22/2013   ALKPHOS 120* 01/22/2013   BILITOT 0.3 01/22/2013      Microbiology: Recent Results (from the past 240 hour(s))  CULTURE, BLOOD (ROUTINE X 2)     Status: None   Collection Time    01/22/13  8:30 PM      Result Value Range Status   Specimen Description BLOOD ARM RIGHT   Final   Special Requests BOTTLES DRAWN AEROBIC AND ANAEROBIC 10CC EACH   Final   Culture  Setup Time 01/23/2013 03:21    Final   Culture     Final   Value:        BLOOD CULTURE RECEIVED NO GROWTH TO DATE CULTURE WILL BE HELD FOR 5 DAYS BEFORE ISSUING A FINAL NEGATIVE REPORT   Report Status PENDING   Incomplete  CULTURE, BLOOD (ROUTINE X 2)     Status: None   Collection Time    01/22/13  8:40 PM      Result Value Range Status   Specimen Description BLOOD HAND RIGHT   Final   Special Requests BOTTLES DRAWN AEROBIC AND ANAEROBIC [facility]   Final   Culture  Setup Time 01/23/2013 03:20   Final   Culture     Final   Value:        BLOOD CULTURE RECEIVED NO GROWTH TO DATE CULTURE WILL BE HELD FOR 5 DAYS BEFORE ISSUING A FINAL NEGATIVE REPORT   Report Status PENDING   Incomplete  MRSA PCR SCREENING     Status: None   Collection Time    01/23/13  1:27 AM      Result Value Range Status   MRSA  by PCR NEGATIVE  NEGATIVE Final   Comment:            The GeneXpert MRSA Assay (FDA     approved for NASAL specimens     only), is one component of a     comprehensive MRSA colonization     surveillance program. It is not     intended to diagnose MRSA     infection nor to guide or     monitor treatment for     MRSA infections.   Assessment: I agree with diagnostic bronchoscopy. I would not broaden her antibiotic therapy at this time as she is not getting worse and I would prefer to have limited impact on sputum stains and cultures.  Plan: 1. Continue levofloxacin 2. Pulmonary evaluation for possible bronchoscopy  Cliffton Asters, MD [facility] [place] [facility] [phone] pager   [phone] cell 01/26/2013, 12:56 PM

## 2013-01-26 NOTE — Consult Note (Signed)
PULMONARY  / CRITICAL CARE MEDICINE  Name: [name] MRN: [id number] DOB: 02/09/1971    ADMISSION DATE:  01/22/2013 CONSULTATION DATE:  2/10  REFERRING MD :  Dr. Irene Limbo  CHIEF COMPLAINT:  Mediastinal Mass, LLL Consolidation, RUL Cavitary Lesion  BRIEF PATIENT DESCRIPTION: 42 y/o F with PMH of Anxiety / Depression, ADHD, Staph Septicemia 2008 after lumbar decompression, hx of non-specific diffuse alveolitis with VDRF admitted on 2/6 with fever, chills, chest pain, dry cough, found to have RUL cavitary lesion.  PCCM consulted for mass / cavitary lesion work up.   SIGNIFICANT EVENTS / STUDIES:  2/6 - Admit with fever, chills, chest pain, dry cough, found to have RUL cavitary lesion.  CT Chest>>Cavitary mass or consolidation in the right upper lung. Focal consolidation or mass in the superior segment of the left lower lung.   LINES / TUBES:   CULTURES: Sputum 2/10>>> PPD 2/10>>> Quantiferon Gold 2/8>>> HIV 2/10>>> Histo 2/8>>>  ANTIBIOTICS: Levaquin 2/6>>>  HISTORY OF PRESENT ILLNESS: 42 y/o F, smoker, with PMH of Anxiety / Depression, ADHD, Staph Septicemia 2008 after lumbar decompression (no residual hardware), hx of non-specific diffuse alveolitis with VDRF admitted on 2/6 with fever, chills, chest pain, dry cough, found to have RUL cavitary lesion.  Patient endorses feeling poorly for 2 months with worsening over past 4 weeks, 10 lb weight loss over two months, decreased appetite, chest pain bilaterally sharp in nature that is intermittent, worsened with cough, night sweats.  Notes two teeth that "need some work" and a film in her mouth that she has spit out over the last month.  Denies shortness of breath, sputum production, nausea / vomiting, binge drinking, HIV, known TB contacts, IV drug use, palpitations, travel in last year.  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  No exotic pets.  PCCM consulted for mass / cavitary lesion work up.   PAST MEDICAL HISTORY :   Past Medical History  Diagnosis Date  . Hx of staphylococcal septicemia   . ADHD (attention deficit hyperactivity disorder)   . Anxiety   . Depression    Past Surgical History  Procedure Laterality Date  . Back surgery    . Left elbow surgery     Prior to Admission medications   Medication Sig Start Date End Date Taking? Authorizing Provider  amphetamine-dextroamphetamine (ADDERALL) 20 MG tablet Take 20 mg by mouth daily.   Yes Historical Provider, MD  Aspirin-Salicylamide-Caffeine (BC HEADACHE PO) Take 2 packets by mouth 3 (three) times daily as needed. For pain   Yes Historical Provider, MD  Cholecalciferol (VITAMIN D PO) Take 1 capsule by mouth daily.   Yes Historical Provider, MD  clonazePAM (KLONOPIN) 1 MG tablet Take 1 mg by mouth 2 (two) times daily as needed. For anxiety   Yes Historical Provider, MD  DULoxetine (CYMBALTA) 60 MG capsule Take 60 mg by mouth 2 (two) times daily.   Yes Historical Provider, MD   Allergies  Allergen Reactions  . Penicillins Other (See Comments)    Childhood reaction    FAMILY HISTORY:  History reviewed. No pertinent family history.  SOCIAL HISTORY:  reports that she has been smoking Cigarettes.  She has been smoking about 0.50 packs per day. She has never used smokeless tobacco. She reports that she does not drink alcohol or use illicit drugs.  REVIEW OF SYSTEMS:   See HPI.  All systems reviewed and pertinent positives in HPI.  Otherwise negative.   SUBJECTIVE: No acute events.  VITAL SIGNS: Temp:  [97.7 F (36.5 C)-99 F (37.2 C)] 97.7 F (36.5 C) (02/10 0523) Pulse Rate:  [58-68] 62 (02/10 0523) Resp:  [16-18] 16 (02/10 0523) BP: (95-106)/(54-65) 106/65 mmHg (02/10 0523) SpO2:  [97 %-99 %] 97 % (02/10 0523)  PHYSICAL EXAMINATION: General:  wdwn thin adult female in NAD Neuro:  AAOx4, speech clear, MAE  HEENT:  Missing most of posterior teeth, one upper & lower with darkening at gums, mm pink/moist, No JVD.  No  supraclavicular LAN.  Cardiovascular:  s1s2 rrr, no m/r/g Lungs:  resp's even/non-labored, lungs bilaterally clear to auscultation  Abdomen:  Flat / soft, bsx4 active Musculoskeletal:  No acute deformities Skin:  Warm/dry, no edema   Recent Labs Lab 01/22/13 1746 01/22/13 2140 01/23/13 0510  NA 135  --  131*  K 4.0  --  4.2  CL 97  --  98  CO2 27  --  24  BUN 11  --  9  CREATININE 0.65 0.68 0.59  GLUCOSE 96  --  114*    Recent Labs Lab 01/23/13 0510 01/24/13 0544 01/25/13 0430  HGB 12.2 11.6* 12.0  HCT 34.3* 34.5* 35.2*  WBC 14.6* 7.4 6.1  PLT 370 394 409*   No results found.  ASSESSMENT / PLAN:   RUL Cavitary Lesion - noted air fluid level in lesion with infiltration around mass.  No significant LAN on CT.  LLL dense consolidation.  HIV negative.  DDx includes chronic oral aspiration, TB, Histo, Crypto.  Hx of staph bacteremia in 2008.  No murmur on exam.  Teeth concerning but no drainage.   Plan: -we plan  FOB for pleural washings 01/27/13 at 3pm -follow PPD, quantiferon gold, histo etc -consider 2D ECHO to rule out vegitation -abx as above, ? clinda   Canary Brim, NP-C [facility] Pgr: [phone] or [phone]  01/26/2013, 10:34 AM  Will make NPO after midnight for bronchoscopy in AM, this is likely pulmonary abscess but will need to r/o endocarditis.  Will perform bronch in AM and will order echo.  Full cultures, PPD ordered, quanti-feron gold ordered and abx as ordered, no clinda for now.  Patient seen and examined, agree with above note.  I dictated the care and orders written for this patient under my direction.  Alyson Reedy, MD [phone]

## 2013-01-26 NOTE — Progress Notes (Signed)
INITIAL NUTRITION ASSESSMENT  DOCUMENTATION CODES Per approved criteria  -Severe malnutrition in the context of acute illness or injury -Underweight   INTERVENTION: 1. Ensure Complete po daily, each supplement provides 350 kcal and 13 grams of protein. 2. Please add daily weight monitoring to assess nutritional adequacy 3. RD will continue to follow      NUTRITION DIAGNOSIS: Inadequate oral intake related to poor appetite as evidenced by weight loss.   Goal: PO intake to meet >/=90% estimated nutrition needs  Monitor:  PO intake, weight trends, I/O's  Reason for Assessment: Health history-unintentional weight loss  42 y.o. female  Admitting Dx: Cavitary lesion of lung  ASSESSMENT: Pt admitted with cough, chills, chest pain, has been losing weight. Concern for TB, cavitary lesions on R lung apex.  Pt states she has not been eating well for several weeks, has lost about 13 lbs in the past month. States she would go for a day with out eating, or complete less then 50% of typical meals. Pt states her appetite has improved since admission.   Pt meets criteria for severe malnutrition in the context of acute illness 2/2 weight loss of 11% in the past month, and meeting </=50% for >/=1 month.   Height: Ht Readings from Last 1 Encounters:  01/23/13 5\' 5"  (1.651 m)    Weight: Wt Readings from Last 1 Encounters:  01/23/13 107 lb 6.4 oz (48.716 kg)    Ideal Body Weight: 125 lbs   % Ideal Body Weight: 89%  Wt Readings from Last 10 Encounters:  01/23/13 107 lb 6.4 oz (48.716 kg)  11/19/07 116 lb 5 oz (52.759 kg)    Usual Body Weight: 120 lbs per pt report   % Usual Body Weight: 89%  BMI:  Body mass index is 17.87 kg/(m^2). underweight   Estimated Nutritional Needs: Kcal: 1600-1800 Protein: 60-70 gm  Fluid: 1.6-1.8 L/day  Skin: intact   Diet Order: General  EDUCATION NEEDS: -No education needs identified at this time   Intake/Output Summary (Last 24 hours) at  01/26/13 1441 Last data filed at 01/26/13 1300  Gross per 24 hour  Intake   1924 ml  Output      0 ml  Net   1924 ml    Last BM:  2/10  Labs:   Recent Labs Lab 01/22/13 1746 01/22/13 2140 01/23/13 0510  NA 135  --  131*  K 4.0  --  4.2  CL 97  --  98  CO2 27  --  24  BUN 11  --  9  CREATININE 0.65 0.68 0.59  CALCIUM 9.6  --  9.3  GLUCOSE 96  --  114*    CBG (last 3)  No results found for this basename: GLUCAP,  in the last 72 hours  Scheduled Meds: . DULoxetine  60 mg Oral BID  . heparin  5,000 Units Subcutaneous Q8H  . levofloxacin  750 mg Oral QHS    Continuous Infusions:   Past Medical History  Diagnosis Date  . Hx of staphylococcal septicemia   . ADHD (attention deficit hyperactivity disorder)   . Anxiety   . Depression     Past Surgical History  Procedure Laterality Date  . Back surgery    . Left elbow surgery      Clarene Duke RD, LDN Pager [phone] After Hours pager [phone]

## 2013-01-27 ENCOUNTER — Encounter (HOSPITAL_COMMUNITY): Payer: Self-pay | Admitting: Critical Care Medicine

## 2013-01-27 ENCOUNTER — Inpatient Hospital Stay (HOSPITAL_COMMUNITY): Payer: Medicare Other

## 2013-01-27 ENCOUNTER — Encounter (HOSPITAL_COMMUNITY)
Admission: EM | Disposition: A | Discharge status: Home / Self Care | Payer: Self-pay | Source: Home / Self Care | Attending: Family Medicine

## 2013-01-27 HISTORY — PX: VIDEO BRONCHOSCOPY: SHX5072

## 2013-01-27 SURGERY — BRONCHOSCOPY, WITH FLUOROSCOPY
Anesthesia: Moderate Sedation

## 2013-01-27 MED ORDER — MIDAZOLAM HCL 5 MG/ML IJ SOLN
INTRAMUSCULAR | Status: AC
  Filled 2013-01-27: qty 2

## 2013-01-27 MED ORDER — LIDOCAINE HCL (PF) 1 % IJ SOLN
INTRAMUSCULAR | Status: DC | PRN
  Administered 2013-01-27 (×2): 6 mL

## 2013-01-27 MED ORDER — MIDAZOLAM HCL 10 MG/2ML IJ SOLN
INTRAMUSCULAR | Status: DC | PRN
  Administered 2013-01-27: 2 mg via INTRAVENOUS
  Administered 2013-01-27: 3 mg via INTRAVENOUS

## 2013-01-27 MED ORDER — LIDOCAINE HCL 2 % EX GEL: Freq: Once | CUTANEOUS | Status: AC

## 2013-01-27 MED ORDER — BUTAMBEN-TETRACAINE-BENZOCAINE 2-2-14 % EX AERO
1.0000 | INHALATION_SPRAY | Freq: Once | CUTANEOUS | Status: DC
  Filled 2013-01-27: qty 56

## 2013-01-27 MED ORDER — SODIUM CHLORIDE 0.9 % IV SOLN
Freq: Once | INTRAVENOUS | Status: AC
  Administered 2013-01-27: 08:00:00 via INTRAVENOUS

## 2013-01-27 MED ORDER — PHENYLEPHRINE HCL 0.25 % NA SOLN
NASAL | Status: DC | PRN
  Administered 2013-01-27: 2 via NASAL

## 2013-01-27 MED ORDER — LIDOCAINE HCL 1 % IJ SOLN: INTRAMUSCULAR | Status: DC | PRN

## 2013-01-27 MED ORDER — LIDOCAINE HCL 2 % EX GEL
CUTANEOUS | Status: DC | PRN
  Administered 2013-01-27: 1

## 2013-01-27 MED ORDER — FENTANYL CITRATE 0.05 MG/ML IJ SOLN
INTRAMUSCULAR | Status: DC | PRN
  Administered 2013-01-27: 40 ug via INTRAVENOUS
  Administered 2013-01-27: 20 ug via INTRAVENOUS

## 2013-01-27 MED ORDER — FENTANYL CITRATE 0.05 MG/ML IJ SOLN
INTRAMUSCULAR | Status: AC
  Filled 2013-01-27: qty 4

## 2013-01-27 MED ORDER — PHENYLEPHRINE HCL 0.25 % NA SOLN
1.0000 | Freq: Four times a day (QID) | NASAL | Status: DC | PRN
  Filled 2013-01-27: qty 15

## 2013-01-27 NOTE — H&P (Signed)
HISTORY OF PRESENT ILLNESS: 42 y/o F, smoker, with PMH of Anxiety / Depression, ADHD, Staph Septicemia 2008 after lumbar decompression (no residual hardware), hx of non-specific diffuse alveolitis with VDRF admitted on 2/6 with fever, chills, chest pain, dry cough, found to have RUL cavitary lesion.  Patient endorses feeling poorly for 2 months with worsening over past 4 weeks, 10 lb weight loss over two months, decreased appetite, chest pain bilaterally sharp in nature that is intermittent, worsened with cough, night sweats.  Notes two teeth that "need some work" and a film in her mouth that she has spit out over the last month.  Denies shortness of breath, sputum production, nausea / vomiting, binge drinking, HIV, known TB contacts, IV drug use, palpitations, travel in last year.  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  No exotic pets.  PCCM consulted for mass / cavitary lesion work up.    PAST MEDICAL HISTORY :   Past Medical History   Diagnosis  Date   .  Hx of staphylococcal septicemia     .  ADHD (attention deficit hyperactivity disorder)     .  Anxiety     .  Depression      Past Surgical History   Procedure  Laterality  Date   .  Back surgery       .  Left elbow surgery        Prior to Admission medications    Medication  Sig  Start Date  End Date  Taking?  Authorizing Provider   amphetamine-dextroamphetamine (ADDERALL) 20 MG tablet  Take 20 mg by mouth daily.      Yes  Historical Provider, MD   Aspirin-Salicylamide-Caffeine (BC HEADACHE PO)  Take 2 packets by mouth 3 (three) times daily as needed. For pain      Yes  Historical Provider, MD   Cholecalciferol (VITAMIN D PO)  Take 1 capsule by mouth daily.      Yes  Historical Provider, MD   clonazePAM (KLONOPIN) 1 MG tablet  Take 1 mg by mouth 2 (two) times daily as needed. For anxiety      Yes  Historical Provider, MD   DULoxetine (CYMBALTA) 60 MG capsule  Take 60 mg by mouth 2 (two) times daily.      Yes  Historical  Provider, MD       Allergies   Allergen  Reactions   .  Penicillins  Other (See Comments)       Childhood reaction      FAMILY HISTORY:  History reviewed. No pertinent family history.   SOCIAL HISTORY: reports that she has been smoking Cigarettes.  She has been smoking about 0.50 packs per day. She has never used smokeless tobacco. She reports that she does not drink alcohol or use illicit drugs.   REVIEW OF SYSTEMS:   See HPI.  All systems reviewed and pertinent positives in HPI.  Otherwise negative.     SUBJECTIVE: No acute events.     VITAL SIGNS: Temp:  [97.7 F (36.5 C)-99 F (37.2 C)] 97.7 F (36.5 C) (02/10 0523) Pulse Rate:  [58-68] 62 (02/10 0523) Resp:  [16-18] 16 (02/10 0523) BP: (95-106)/(54-65) 106/65 mmHg (02/10 0523) SpO2:  [97 %-99 %] 97 % (02/10 0523)   PHYSICAL EXAMINATION: General:  wdwn thin adult female in NAD Neuro:  AAOx4, speech clear, MAE     HEENT:  Missing most of posterior teeth, one upper & lower with darkening at gums,  mm pink/moist, No JVD.  No supraclavicular LAN.   Cardiovascular:  s1s2 rrr, no m/r/g Lungs:  resp's even/non-labored, lungs bilaterally clear to auscultation   Abdomen:  Flat / soft, bsx4 active Musculoskeletal:  No acute deformities Skin:  Warm/dry, no edema   Recent Labs Lab  01/22/13 1746  01/22/13 2140  01/23/13 0510   NA  135   --   131*   K  4.0   --   4.2   CL  97   --   98   CO2  27   --   24   BUN  11   --   9   CREATININE  0.65  0.68  0.59   GLUCOSE  96   --   114*    Recent Labs Lab  01/23/13 0510  01/24/13 0544  01/25/13 0430   HGB  12.2  11.6*  12.0   HCT  34.3*  34.5*  35.2*   WBC  14.6*  7.4  6.1   PLT  370  394  409*    No results found.   ASSESSMENT / PLAN:     RUL Cavitary Lesion - noted air fluid level in lesion with infiltration around mass.  No significant LAN on CT.  LLL dense consolidation.  HIV negative.  DDx includes chronic oral aspiration, TB, Histo, Crypto.  Hx of  staph bacteremia in 2008.  No murmur on exam.  Teeth concerning but no drainage.    Plan: -we plan  FOB for pleural washings 01/27/13 at 3pm -follow PPD, quantiferon gold, histo etc -consider 2D ECHO to rule out vegitation -abx as above, ? clinda    Brenda Brim, NP-C  Pulmonary & Critical Care Pgr: [phone] or [phone]   01/26/2013, 10:34 AM   Will make NPO after midnight for bronchoscopy in AM, this is likely pulmonary abscess but will need to r/o endocarditis.  Will perform bronch in AM and will order echo.  Full cultures, PPD ordered, quanti-feron gold ordered and abx as ordered, no clinda for now.   Patient seen and examined, agree with above note.  I dictated the care and orders written for this patient under my direction.   Brenda Reedy, MD [phone]    The history and exam was reviewed above and performed again by me. No interval changes. The pt is ready for bronchoscopy.  [name] Beeper  [phone]  Cell  [phone]  If no response or cell goes to voicemail, call beeper [phone]

## 2013-01-27 NOTE — Care Management Note (Signed)
    Page 1 of 1   01/30/2013     4:12:49 PM   CARE MANAGEMENT NOTE 01/30/2013  Patient:  [name], [name]   Account Number:  [id number]  Date Initiated:  01/27/2013  Documentation initiated by:  Letha Cape  Subjective/Objective Assessment:   dx lung abscess  admit- lives with family.     Action/Plan:   bronchoscopy 2/11   Anticipated DC Date:  01/28/2013   Anticipated DC Plan:  HOME/SELF CARE      DC Planning Services  CM consult      Choice offered to / List presented to:             Status of service:  Completed, signed off Medicare Important Message given?   (If response is "NO", the following Medicare IM given date fields will be blank) Date Medicare IM given:   Date Additional Medicare IM given:    Discharge Disposition:  HOME/SELF CARE  Per UR Regulation:  Reviewed for med. necessity/level of care/duration of stay  If discussed at Long Length of Stay Meetings, dates discussed:    Comments:  01/30/13 16:11 Letha Cape RN, BSN [phone] patient dc home.  01/27/13 17:32 Letha Cape RN, BSN [phone] patient lives with family, patient had bronch on 2/11.  NCM will continue to follow for dc needs.

## 2013-01-27 NOTE — Progress Notes (Signed)
Video bronchoscopy procedure performed. Bronchial washing intervention performed.  I agree with above Dorcas Carrow

## 2013-01-27 NOTE — Op Note (Signed)
Bronchoscopy Procedure Note  Date of Operation: 01/27/2013  Pre-op Diagnosis: Lung abscess  Post-op Diagnosis: same  Surgeon: Shan Levans  Anesthesia: Monitored Local Anesthesia with Sedation  Operation: Flexible fiberoptic bronchoscopy, diagnostic   Findings: No endobronchial lesions, no airway inflammation, no purulence seen  Specimen: Bronchial washing RUL  Estimated Blood Loss: None  Complications: None  Indications and History: The patient is a 42 y.o. female with RUL , LLL lung abscess.  The risks, benefits, complications, treatment options and expected outcomes were discussed with the patient.  The possibilities of reaction to medication, pulmonary aspiration, perforation of a viscus, bleeding, failure to diagnose a condition and creating a complication requiring transfusion or operation were discussed with the patient who freely signed the consent.    Description of Procedure: The patient was re-examined in the bronchoscopy suite and the site of surgery properly noted/marked.  The patient was identified as Manufacturing engineer and the procedure verified as Flexible Fiberoptic Bronchoscopy.  A Time Out was held and the above information confirmed.   After the induction of topical nasopharyngeal anesthesia, the patient was positioned  and the bronchoscope was passed through the Right nares. The vocal cords were visualized and  1% buffered lidocaine 5 ml was topically placed onto the cords. The cords were normal. The scope was then passed into the trachea.  1% buffered lidocaine 5 ml was used topically on the carina.  Careful inspection of the tracheal lumen was accomplished. The scope was sequentially passed into the left main and then left upper and lower bronchi and segmental bronchi.      The scope was then withdrawn and advanced into the right main bronchus and then into the RUL, RML, and RLL bronchi and segmental bronchi.   Bronchial washings  were done and there was no specimen.    Endobronchial findings: No endobronchial biopsies. Trachea: Normal mucosa Carina: Normal mucosa Right main bronchus: Normal mucosa Right upper lobe bronchus: Normal mucosa Right middle lobe bronchus: Normal mucosa Right lower lobe bronchus: Normal mucosa Left main bronchus: Normal mucosa Left upper lobe bronchus: Normal mucosa Left lower lobe bronchus: Normal mucosa  The Patient was taken to the Endoscopy Recovery area in satisfactory condition.  Attestation: I performed the procedure.  Dorcas Carrow Beeper  [phone]  Cell  [phone]  If no response or cell goes to voicemail, call beeper [phone]

## 2013-01-27 NOTE — Progress Notes (Addendum)
Patient ID: [name], female   DOB: Feb 22, 1971, 42 y.o.   MRN: [id number] And a         [facility]    Date of Admission:  01/22/2013           Day 5 levofloxacin  Principal Problem:   Cavitary lesion of lung Active Problems:   Intraspinal abscess   Hx of staphylococcal infection   Mediastinal mass   Pulmonary alveolitis   Depression   ADHD (attention deficit hyperactivity disorder)   Chronic back pain   Pulmonary abscess   Pneumonia, organism unspecified   . DULoxetine  60 mg Oral BID  . feeding supplement  237 mL Oral Q24H  . heparin  5,000 Units Subcutaneous Q8H  . levofloxacin  750 mg Oral QHS   Objective: Temp:  [98 F (36.7 C)-98.5 F (36.9 C)] 98 F (36.7 C) (02/11 0423) Pulse Rate:  [70-77] 70 (02/11 0423) Resp:  [11-23] 23 (02/11 1410) BP: (93-127)/(55-79) 99/63 mmHg (02/11 1410) SpO2:  [94 %-100 %] 95 % (02/11 1410)  General: Comfortable Skin: No rash Lungs: Clear  Lab Results Lab Results  Component Value Date   WBC 6.1 01/25/2013   HGB 12.0 01/25/2013   HCT 35.2* 01/25/2013   MCV 93.1 01/25/2013   PLT 409* 01/25/2013    Lab Results  Component Value Date   CREATININE 0.59 01/23/2013   BUN 9 01/23/2013   NA 131* 01/23/2013   K 4.2 01/23/2013   CL 98 01/23/2013   CO2 24 01/23/2013    Lab Results  Component Value Date   ALT 9 01/22/2013   AST 12 01/22/2013   ALKPHOS 120* 01/22/2013   BILITOT 0.3 01/22/2013    Quantiferon and Gold assay: Negative  Cryptococcal antigen: Negative   Microbiology: Recent Results (from the past 240 hour(s))  CULTURE, BLOOD (ROUTINE X 2)     Status: None   Collection Time    01/22/13  8:30 PM      Result Value Range Status   Specimen Description BLOOD ARM RIGHT   Final   Special Requests BOTTLES DRAWN AEROBIC AND ANAEROBIC 10CC EACH   Final   Culture  Setup Time 01/23/2013 03:21   Final   Culture     Final   Value:        BLOOD CULTURE RECEIVED NO GROWTH TO DATE CULTURE WILL BE HELD FOR 5 DAYS BEFORE  ISSUING A FINAL NEGATIVE REPORT   Report Status PENDING   Incomplete  CULTURE, BLOOD (ROUTINE X 2)     Status: None   Collection Time    01/22/13  8:40 PM      Result Value Range Status   Specimen Description BLOOD HAND RIGHT   Final   Special Requests BOTTLES DRAWN AEROBIC AND ANAEROBIC [facility]   Final   Culture  Setup Time 01/23/2013 03:20   Final   Culture     Final   Value:        BLOOD CULTURE RECEIVED NO GROWTH TO DATE CULTURE WILL BE HELD FOR 5 DAYS BEFORE ISSUING A FINAL NEGATIVE REPORT   Report Status PENDING   Incomplete  MRSA PCR SCREENING     Status: None   Collection Time    01/23/13  1:27 AM      Result Value Range Status   MRSA by PCR NEGATIVE  NEGATIVE Final   Comment:            The GeneXpert MRSA Assay (  FDA     approved for NASAL specimens     only), is one component of a     comprehensive MRSA colonization     surveillance program. It is not     intended to diagnose MRSA     infection nor to guide or     monitor treatment for     MRSA infections.    Studies/Results: Dg Orthopantogram  01/26/2013  *RADIOLOGY REPORT*  Clinical Data: Dental caries, lung abscess.  ORTHOPANTOGRAM/PANORAMIC  Comparison: None.  Findings: Slight lucency at the base of the lone remaining right lower molar suggests early periapical abscess.  Missing mandibular and maxillary teeth.  IMPRESSION: Suspect early periapical abscess adjacent to the lone remaining right lower molar.   Original Report Authenticated By: Charlett Nose, M.D.     Assessment: No definite abnormalities noted on bronchoscopy. This was discussed with Dr. Delford Field. She may have an abscessed tooth which could be a potential source although the right apical location of her cavitary lesion would be unusual for aspiration pneumonia. I will continue levofloxacin pending bronchoscopy results.  Plan: 1. Continue levofloxacin 2. Await bronchoscopy results  Cliffton Asters, MD [facility] [phone] pager   [phone] cell 01/27/2013, 2:49 PM     And a

## 2013-01-27 NOTE — Progress Notes (Signed)
[facility] PROGRESS NOTE  [name] MVH:[id number] DOB: 15-Nov-1971 DOA: 01/22/2013 PCP: No primary provider on file. None  Brief narrative: 42 year old woman presented with two-week history of fever, chills, cough with associated bilateral chest pain. CXR demonstrated new cavitary lesion in the R lung apex as well as left lower lobe consolidation. Empiric Levaquin was started and patient placed on airborne isolation. Infectious disease and pulmonology consulted. Patient has improved with Levaquin and underwent unremarkable bronchoscopy with studies pending.   Assessment/Plan: 1. Right upper lobe cavitary lung lesion, left lower lobe consolidation: Status post bronchoscopy today. Remains afebrile, leukocytosis resolved. Etiology unclear. Workup negative thus far. Continue empiric Levaquin, airborne isolation.  HIV nonreactive.  2. ADHD: Continue Adderall. 3. Anxiety, depression: Continue Cymbalta and Klonopin. 4. History of superficial wound infection following lumbar decompression with associated staph bacteremia 2008. 5. 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 Anterior mediastinal mass, hilar lymphadenopathy and hepatosplenomegaly was noted. Followup CT was recommended but patient did not pursue.  Code Status: Full code Family Communication: None present Disposition Plan: Home when improved.  Brenda Sacks, MD  [facility] Team 5 Pager [phone] If 7PM-7AM, please contact night-coverage at [email or web address], password [facility] 01/27/2013, 5:30 PM  LOS: 5 days   Consultants:  Infectious disease  Pulmonology  Procedures:  2-D echocardiogram: Left ventricle: The cavity size was normal. Systolic function was normal. The estimated ejection fraction was in the range of 60% to 65%. Wall motion was normal; there were no regional wall motion abnormalities. Left ventricular diastolic function  parameters were normal.  Bronchoscopy 2/11: No endobronchial lesions, no airway inflammation, no purulence seen  Antibiotics: Levaquin 2/6 >>  HPI/Subjective: Still has right side chest pain although this has improved. Breathing okay.  Objective: Filed Vitals:   01/27/13 1400 01/27/13 1405 01/27/13 1410 01/27/13 1453  BP: 97/60 99/63 99/63  96/53  Pulse:    77  Temp:    98.8 F (37.1 C)  TempSrc:    Oral  Resp: 22 21 23 18   Height:      Weight:      SpO2: 94% 95% 95% 97%    Intake/Output Summary (Last 24 hours) at 01/27/13 1730 Last data filed at 01/27/13 1300  Gross per 24 hour  Intake    444 ml  Output      0 ml  Net    444 ml   Filed Weights   01/23/13 0026 01/23/13 1602  Weight: 51.71 kg (114 lb) 48.716 kg (107 lb 6.4 oz)    Exam:  General:  Appears calm and comfortable Cardiovascular: RRR, no m/r/g. No LE edema. Respiratory: Clear to auscultation bilaterally. No wheezes, rhonchi or rales. Normal respiratory effort.   Data Reviewed: Basic Metabolic Panel:  Recent Labs Lab 01/22/13 1746 01/22/13 2140 01/23/13 0510  NA 135  --  131*  K 4.0  --  4.2  CL 97  --  98  CO2 27  --  24  GLUCOSE 96  --  114*  BUN 11  --  9  CREATININE 0.65 0.68 0.59  CALCIUM 9.6  --  9.3   Liver Function Tests:  Recent Labs Lab 01/22/13 1746  AST 12  ALT 9  ALKPHOS 120*  BILITOT 0.3  PROT 7.0  ALBUMIN 3.2*   CBC:  Recent Labs Lab 01/22/13 1746 01/22/13 2140 01/23/13 0510 01/24/13 0544 01/25/13 0430  WBC 12.4* 12.3* 14.6* 7.4 6.1  HGB 13.0 12.5 12.2 11.6* 12.0  HCT  37.0 36.4 34.3* 34.5* 35.2*  MCV 92.5 93.1 92.0 93.0 93.1  PLT 427* 390 370 394 409*     Recent Results (from the past 240 hour(s))  CULTURE, BLOOD (ROUTINE X 2)     Status: None   Collection Time    01/22/13  8:30 PM      Result Value Range Status   Specimen Description BLOOD ARM RIGHT   Final   Special Requests BOTTLES DRAWN AEROBIC AND ANAEROBIC 10CC EACH   Final   Culture  Setup  Time 01/23/2013 03:21   Final   Culture     Final   Value:        BLOOD CULTURE RECEIVED NO GROWTH TO DATE CULTURE WILL BE HELD FOR 5 DAYS BEFORE ISSUING A FINAL NEGATIVE REPORT   Report Status PENDING   Incomplete  CULTURE, BLOOD (ROUTINE X 2)     Status: None   Collection Time    01/22/13  8:40 PM      Result Value Range Status   Specimen Description BLOOD HAND RIGHT   Final   Special Requests BOTTLES DRAWN AEROBIC AND ANAEROBIC [facility]   Final   Culture  Setup Time 01/23/2013 03:20   Final   Culture     Final   Value:        BLOOD CULTURE RECEIVED NO GROWTH TO DATE CULTURE WILL BE HELD FOR 5 DAYS BEFORE ISSUING A FINAL NEGATIVE REPORT   Report Status PENDING   Incomplete  MRSA PCR SCREENING     Status: None   Collection Time    01/23/13  1:27 AM      Result Value Range Status   MRSA by PCR NEGATIVE  NEGATIVE Final   Comment:            The GeneXpert MRSA Assay (FDA     approved for NASAL specimens     only), is one component of a     comprehensive MRSA colonization     surveillance program. It is not     intended to diagnose MRSA     infection nor to guide or     monitor treatment for     MRSA infections.     Studies: Dg Orthopantogram  01/26/2013  *RADIOLOGY REPORT*  Clinical Data: Dental caries, lung abscess.  ORTHOPANTOGRAM/PANORAMIC  Comparison: None.  Findings: Slight lucency at the base of the lone remaining right lower molar suggests early periapical abscess.  Missing mandibular and maxillary teeth.  IMPRESSION: Suspect early periapical abscess adjacent to the lone remaining right lower molar.   Original Report Authenticated By: Charlett Nose, M.D.     Scheduled Meds: . DULoxetine  60 mg Oral BID  . feeding supplement  237 mL Oral Q24H  . heparin  5,000 Units Subcutaneous Q8H  . levofloxacin  750 mg Oral QHS   Continuous Infusions:   Principal Problem:   Cavitary lesion of lung Active Problems:   Intraspinal abscess   Hx of staphylococcal infection   Mediastinal  mass   Pulmonary alveolitis   Depression   ADHD (attention deficit hyperactivity disorder)   Chronic back pain   Pulmonary abscess   Pneumonia, organism unspecified     Brenda Sacks, MD  [facility] Team 5 Pager [phone] If 7PM-7AM, please contact night-coverage at [email or web address], password [facility] 01/27/2013, 5:30 PM  LOS: 5 days   Time spent: 15 minutes

## 2013-01-28 ENCOUNTER — Encounter (HOSPITAL_COMMUNITY): Payer: Self-pay | Admitting: Critical Care Medicine

## 2013-01-28 DIAGNOSIS — F329 Major depressive disorder, single episode, unspecified: Secondary | ICD-10-CM

## 2013-01-28 LAB — HISTOPLASMA ANTIGEN, URINE: Histoplasma Antigen, urine: <0.5 ng/mL

## 2013-01-28 MED ORDER — OXYCODONE HCL 5 MG PO TABS: 5.0000 mg | ORAL_TABLET | Freq: Four times a day (QID) | ORAL | Status: DC | PRN

## 2013-01-28 MED ORDER — LEVOFLOXACIN 750 MG PO TABS: 750.0000 mg | ORAL_TABLET | Freq: Every day | ORAL | Status: DC

## 2013-01-28 NOTE — Progress Notes (Signed)
TB skin test negative.

## 2013-01-28 NOTE — Progress Notes (Signed)
Reviewed discharge instructions with patient.  Pt verbalized understanding.  IV removed and intact.  Pt ambulated with husband at discharge.  Denied any need for wheelchair and denied any complaints.

## 2013-01-28 NOTE — Discharge Summary (Addendum)
PATIENT DETAILS Name: [name] Age: 42 y.o. Sex: female Date of Birth: June 03, 1971 MRN: [id number]. Admit Date: 01/22/2013 Admitting Physician: Hillary Bow, DO PCP:No primary provider on file.  Recommendations for Outpatient Follow-up:  1. Will need followup of bronchoalveolar lavage cultures and smears 2. Will need radiographic followup of the lung abscess post antibiotic treatment  PRIMARY DISCHARGE DIAGNOSIS:  Principal Problem:   Cavitary lesion of lung Active Problems:   Intraspinal abscess   Hx of staphylococcal infection   Mediastinal mass   Pulmonary alveolitis   Depression   ADHD (attention deficit hyperactivity disorder)   Chronic back pain   Pulmonary abscess   Pneumonia, organism unspecified      PAST MEDICAL HISTORY: Past Medical History  Diagnosis Date  . Hx of staphylococcal septicemia   . ADHD (attention deficit hyperactivity disorder)   . Anxiety   . Depression     DISCHARGE MEDICATIONS:   Medication List    TAKE these medications       amphetamine-dextroamphetamine 20 MG tablet  Commonly known as:  ADDERALL  Take 20 mg by mouth daily.     BC HEADACHE PO  Take 2 packets by mouth 3 (three) times daily as needed. For pain     clonazePAM 1 MG tablet  Commonly known as:  KLONOPIN  Take 1 mg by mouth 2 (two) times daily as needed. For anxiety     DULoxetine 60 MG capsule  Commonly known as:  CYMBALTA  Take 60 mg by mouth 2 (two) times daily.     levofloxacin 750 MG tablet  Commonly known as:  LEVAQUIN  Take 1 tablet (750 mg total) by mouth at bedtime.     oxyCODONE 5 MG immediate release tablet  Commonly known as:  Oxy IR/ROXICODONE  Take 1 tablet (5 mg total) by mouth every 6 (six) hours as needed.     VITAMIN D PO  Take 1 capsule by mouth daily.         BRIEF HPI:  See H&P, Labs, Consult and Test reports for all details in brief, patient was admitted for fever and chills. She also had a cough. Chest x-ray on admission  showed a cavitary lesion in the right upper lobe.  CONSULTATIONS:   pulmonary/intensive care and ID  PERTINENT RADIOLOGIC STUDIES: Dg Orthopantogram  01/26/2013  *RADIOLOGY REPORT*  Clinical Data: Dental caries, lung abscess.  ORTHOPANTOGRAM/PANORAMIC  Comparison: None.  Findings: Slight lucency at the base of the lone remaining right lower molar suggests early periapical abscess.  Missing mandibular and maxillary teeth.  IMPRESSION: Suspect early periapical abscess adjacent to the lone remaining right lower molar.   Original Report Authenticated By: Charlett Nose, M.D.    Dg Chest 2 View  01/22/2013  *RADIOLOGY REPORT*  Clinical Data: Chest pain, cough, headaches normal cardiac silhouette and mediastinal contours.  CHEST - 2 VIEW  Comparison: 10/10/2007; 10/03/2007;  Findings: Grossly unchanged cardiac silhouette and mediastinal contours.  Interval development of air and fluid containing opacity within the right lung apex measuring approximately 4.0 x 4.1 cm. There is a smaller possible air fluid containing opacity within the expected location of the superior segment left lower lobe.  No pleural effusion or pneumothorax.  Unchanged bones.  IMPRESSION: Overall findings worrisome for multifocal possibly cavitary infection. Given upper lobe predominance, atypical etiologies are not excluded.  Further evaluation with chest CT may be performed as clinically indicated.   Original Report Authenticated By: Tacey Ruiz, MD    Ct Chest  W Contrast  01/22/2013  *RADIOLOGY REPORT*  Clinical Data: Fever, chills, and flu-like symptoms beginning week ago.  Last night developed acute chest pain.  Dry cough.  Short of breath.  Headache.  Cavitary lesions on chest x-ray.  CT CHEST WITH CONTRAST  Technique:  Multidetector CT imaging of the chest was performed following the standard protocol during bolus administration of intravenous contrast.  Contrast: 80mL OMNIPAQUE IOHEXOL 300 MG/ML  SOLN  Comparison: CT chest 03/21/2006.   Chest x-ray 01/22/2013.  Findings: Since the previous study, there is interval development of a cavitary mass in the right upper lung measuring about 4.3 cm diameter.  An air-fluid level is present and there is infiltration around the mass.  Another focal area of consolidation with tiny gas collection is demonstrated in the superior segment of the left lower lung measuring 3.9 x 2 cm.  These changes were not present on the previous study.  This could represent focal consolidation due to infectious process with necrotizing pneumonia, atypical infection such as TB, or multi focal mass lesions.  There are mild emphysematous changes in the apices.  No pleural effusions.  No significant lymphadenopathy in the chest.  Normal heart size. Normal caliber thoracic aorta.  Visualized portions of the upper abdominal organs appear unremarkable.  Degenerative changes in the thoracic spine.  No destructive or expansile bone lesions appreciated.  IMPRESSION: Cavitary mass or consolidation in the right upper lung.  Focal consolidation or mass in the superior segment of the left lower lung.  Differential diagnosis includes necrotizing pneumonia, atypical infectious process, or neoplasm.   Original Report Authenticated By: Burman Nieves, M.D.      PERTINENT LAB RESULTS: CBC: No results found for this basename: WBC, HGB, HCT, PLT,  in the last 72 hours CMET CMP     Component Value Date/Time   NA 131* 01/23/2013 0510   K 4.2 01/23/2013 0510   CL 98 01/23/2013 0510   CO2 24 01/23/2013 0510   GLUCOSE 114* 01/23/2013 0510   BUN 9 01/23/2013 0510   CREATININE 0.59 01/23/2013 0510   CALCIUM 9.3 01/23/2013 0510   PROT 7.0 01/22/2013 1746   ALBUMIN 3.2* 01/22/2013 1746   AST 12 01/22/2013 1746   ALT 9 01/22/2013 1746   ALKPHOS 120* 01/22/2013 1746   BILITOT 0.3 01/22/2013 1746   GFRNONAA >90 01/23/2013 0510   GFRAA >90 01/23/2013 0510    GFR Estimated Creatinine Clearance: 71.9 ml/min (by C-G formula based on Cr of 0.59). No results found  for this basename: LIPASE, AMYLASE,  in the last 72 hours No results found for this basename: CKTOTAL, CKMB, CKMBINDEX, TROPONINI,  in the last 72 hours No components found with this basename: POCBNP,  No results found for this basename: DDIMER,  in the last 72 hours No results found for this basename: HGBA1C,  in the last 72 hours No results found for this basename: CHOL, HDL, LDLCALC, TRIG, CHOLHDL, LDLDIRECT,  in the last 72 hours No results found for this basename: TSH, T4TOTAL, FREET3, T3FREE, THYROIDAB,  in the last 72 hours No results found for this basename: VITAMINB12, FOLATE, FERRITIN, TIBC, IRON, RETICCTPCT,  in the last 72 hours Coags: No results found for this basename: PT, INR,  in the last 72 hours Microbiology: Recent Results (from the past 240 hour(s))  CULTURE, BLOOD (ROUTINE X 2)     Status: None   Collection Time    01/22/13  8:30 PM      Result Value Range Status  Specimen Description BLOOD ARM RIGHT   Final   Special Requests BOTTLES DRAWN AEROBIC AND ANAEROBIC 10CC EACH   Final   Culture  Setup Time 01/23/2013 03:21   Final   Culture     Final   Value:        BLOOD CULTURE RECEIVED NO GROWTH TO DATE CULTURE WILL BE HELD FOR 5 DAYS BEFORE ISSUING A FINAL NEGATIVE REPORT   Report Status PENDING   Incomplete  CULTURE, BLOOD (ROUTINE X 2)     Status: None   Collection Time    01/22/13  8:40 PM      Result Value Range Status   Specimen Description BLOOD HAND RIGHT   Final   Special Requests BOTTLES DRAWN AEROBIC AND ANAEROBIC [facility]   Final   Culture  Setup Time 01/23/2013 03:20   Final   Culture     Final   Value:        BLOOD CULTURE RECEIVED NO GROWTH TO DATE CULTURE WILL BE HELD FOR 5 DAYS BEFORE ISSUING A FINAL NEGATIVE REPORT   Report Status PENDING   Incomplete  MRSA PCR SCREENING     Status: None   Collection Time    01/23/13  1:27 AM      Result Value Range Status   MRSA by PCR NEGATIVE  NEGATIVE Final   Comment:            The GeneXpert MRSA Assay (FDA      approved for NASAL specimens     only), is one component of a     comprehensive MRSA colonization     surveillance program. It is not     intended to diagnose MRSA     infection nor to guide or     monitor treatment for     MRSA infections.  FUNGUS CULTURE W SMEAR     Status: None   Collection Time    01/27/13  2:01 PM      Result Value Range Status   Specimen Description BRONCHIAL WASHINGS   Final   Special Requests RUL   Final   Fungal Smear NO YEAST OR FUNGAL ELEMENTS SEEN   Final   Culture CULTURE IN PROGRESS FOR FOUR WEEKS   Final   Report Status PENDING   Incomplete  LEGIONELLA CULTURE     Status: None   Collection Time    01/27/13  2:01 PM      Result Value Range Status   Specimen Description BRONCHIAL WASHINGS   Final   Special Requests RUL   Final   Culture     Final   Value: NO LEGIONELLA ISOLATED, CULTURE IN PROGRESS FOR 5 DAYS   Report Status PENDING   Incomplete  CULTURE, RESPIRATORY (NON-EXPECTORATED)     Status: None   Collection Time    01/27/13  2:01 PM      Result Value Range Status   Specimen Description BRONCHIAL WASHINGS   Final   Special Requests RUL   Final   Gram Stain     Final   Value: RARE WBC PRESENT, PREDOMINANTLY PMN     NO SQUAMOUS EPITHELIAL CELLS SEEN     NO ORGANISMS SEEN   Culture Culture reincubated for better growth   Final   Report Status PENDING   Incomplete     BRIEF HOSPITAL COURSE:   Principal Problem:   Cavitary lesion of lung - Patient was admitted for fever with chills and cough he did a chest x-ray  demonstrated a cavitary lesion in the right lung apex. Patient was started on Levaquin therapy, infectious disease and pulmonary critical care was consulted. Patient did have mild leukocytosis, however that has resolved with antibiotic therapy. She is now persistently afebrile and is requesting to be discharged home. She underwent a bronchoscopy with bronchoalveolar lavage on 01/27/13, results of which are currently pending. A  PPD was placed and this was negative. Gold Quantiferon was also negative. Etiology of this lung abscesses currently unknown,-atypical infection is suspected. Spoke with Dr. Orvan Falconer from infectious disease today, he recommended that she be discharged on one month of Levaquin. He will arrange for outpatient followup with his office for further continued monitoring and care. Please note that the bronchoalveolar lavage results are currently pending at the time of discharge. Also, HIV was nonreactive.  Early dental abscess  -no fever or swelling  -this will need to be addressed in the outpatient setting-patient will follow up with her primary dentist- patient is agreeable.  ADHD: - Continue Adderall.  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  -Anterior mediastinal mass, hilar lymphadenopathy and hepatosplenomegaly was noted. Followup CT was recommended but patient did not pursue-however CT Chest done this admit-did not show any lymphadenopathty  TODAY-DAY OF DISCHARGE:  Subjective:   [name] today has no headache,no chest abdominal pain,no new weakness tingling or numbness, feels much better wants to go home today.  Objective:   Blood pressure 89/46, pulse 63, temperature 97.7 F (36.5 C), temperature source Oral, resp. rate 15, height 5\' 5"  (1.651 m), weight 48.716 kg (107 lb 6.4 oz), SpO2 99.00%.  Intake/Output Summary (Last 24 hours) at 01/28/13 1219 Last data filed at 01/27/13 1300  Gross per 24 hour  Intake      0 ml  Output      0 ml  Net      0 ml    Exam Awake Alert, Oriented *3, No new F.N deficits, Normal affect [place].AT,PERRAL Supple Neck,No JVD, No cervical lymphadenopathy appriciated.  Symmetrical Chest wall movement, Good air movement bilaterally, CTAB RRR,No Gallops,Rubs or new Murmurs, No Parasternal Heave +ve B.Sounds, Abd  Soft, Non tender, No organomegaly appriciated, No rebound -guarding or rigidity. No Cyanosis, Clubbing or edema, No new Rash or bruise  DISCHARGE CONDITION: Stable  DISPOSITION: HOME  DISCHARGE INSTRUCTIONS:    Activity:  As tolerated  Diet recommendation: Regular Diet  Follow-up Information   Follow up with Cliffton Asters, MD. (office will arrange for outpatient follow up)    Contact information:   [address] [place] [address] [phone]      Total Time spent on discharge equals 45 minutes.  SignedJeoffrey Massed 01/28/2013 12:19 PM

## 2013-01-28 NOTE — Progress Notes (Signed)
Patient ID: [name], female   DOB: Oct 30, 1971, 42 y.o.   MRN: [id number]         [facility]    Date of Admission:  01/22/2013           Day 6 levofloxacin Principal Problem:   Cavitary lesion of lung Active Problems:   Intraspinal abscess   Hx of staphylococcal infection   Mediastinal mass   Pulmonary alveolitis   Depression   ADHD (attention deficit hyperactivity disorder)   Chronic back pain   Pulmonary abscess   Pneumonia, organism unspecified   . DULoxetine  60 mg Oral BID  . feeding supplement  237 mL Oral Q24H  . heparin  5,000 Units Subcutaneous Q8H  . levofloxacin  750 mg Oral QHS    Subjective: She is feeling better with decreased cough and chest pain. She is eager to go home.  Objective: Temp:  [97.7 F (36.5 C)-98.8 F (37.1 C)] 97.7 F (36.5 C) (02/12 0603) Pulse Rate:  [63-77] 63 (02/12 0603) Resp:  [11-23] 15 (02/12 0603) BP: (89-127)/(46-79) 89/46 mmHg (02/12 0603) SpO2:  [94 %-100 %] 99 % (02/12 0603)  General: She is alert and comfortable Skin: No rash Lungs: Clear Cor: Regular S1 and S2 with no murmurs  Lab Results Lab Results  Component Value Date   WBC 6.1 01/25/2013   HGB 12.0 01/25/2013   HCT 35.2* 01/25/2013   MCV 93.1 01/25/2013   PLT 409* 01/25/2013    Lab Results  Component Value Date   CREATININE 0.59 01/23/2013   BUN 9 01/23/2013   NA 131* 01/23/2013   K 4.2 01/23/2013   CL 98 01/23/2013   CO2 24 01/23/2013    Lab Results  Component Value Date   ALT 9 01/22/2013   AST 12 01/22/2013   ALKPHOS 120* 01/22/2013   BILITOT 0.3 01/22/2013      Microbiology: Recent Results (from the past 240 hour(s))  CULTURE, BLOOD (ROUTINE X 2)     Status: None   Collection Time    01/22/13  8:30 PM      Result Value Range Status   Specimen Description BLOOD ARM RIGHT   Final   Special Requests BOTTLES DRAWN AEROBIC AND ANAEROBIC 10CC EACH   Final   Culture  Setup Time 01/23/2013 03:21   Final   Culture     Final   Value:         BLOOD CULTURE RECEIVED NO GROWTH TO DATE CULTURE WILL BE HELD FOR 5 DAYS BEFORE ISSUING A FINAL NEGATIVE REPORT   Report Status PENDING   Incomplete  CULTURE, BLOOD (ROUTINE X 2)     Status: None   Collection Time    01/22/13  8:40 PM      Result Value Range Status   Specimen Description BLOOD HAND RIGHT   Final   Special Requests BOTTLES DRAWN AEROBIC AND ANAEROBIC [facility]   Final   Culture  Setup Time 01/23/2013 03:20   Final   Culture     Final   Value:        BLOOD CULTURE RECEIVED NO GROWTH TO DATE CULTURE WILL BE HELD FOR 5 DAYS BEFORE ISSUING A FINAL NEGATIVE REPORT   Report Status PENDING   Incomplete  MRSA PCR SCREENING     Status: None   Collection Time    01/23/13  1:27 AM      Result Value Range Status   MRSA by PCR NEGATIVE  NEGATIVE  Final   Comment:            The GeneXpert MRSA Assay (FDA     approved for NASAL specimens     only), is one component of a     comprehensive MRSA colonization     surveillance program. It is not     intended to diagnose MRSA     infection nor to guide or     monitor treatment for     MRSA infections.  FUNGUS CULTURE W SMEAR     Status: None   Collection Time    01/27/13  2:01 PM      Result Value Range Status   Specimen Description BRONCHIAL WASHINGS   Final   Special Requests RUL   Final   Fungal Smear NO YEAST OR FUNGAL ELEMENTS SEEN   Final   Culture CULTURE IN PROGRESS FOR FOUR WEEKS   Final   Report Status PENDING   Incomplete  LEGIONELLA CULTURE     Status: None   Collection Time    01/27/13  2:01 PM      Result Value Range Status   Specimen Description BRONCHIAL WASHINGS   Final   Special Requests RUL   Final   Culture     Final   Value: NO LEGIONELLA ISOLATED, CULTURE IN PROGRESS FOR 5 DAYS   Report Status PENDING   Incomplete  CULTURE, RESPIRATORY (NON-EXPECTORATED)     Status: None   Collection Time    01/27/13  2:01 PM      Result Value Range Status   Specimen Description BRONCHIAL WASHINGS   Final   Special  Requests RUL   Final   Gram Stain     Final   Value: RARE WBC PRESENT, PREDOMINANTLY PMN     NO SQUAMOUS EPITHELIAL CELLS SEEN     NO ORGANISMS SEEN   Culture Culture reincubated for better growth   Final   Report Status PENDING   Incomplete    Studies/Results: Dg Orthopantogram  01/26/2013  *RADIOLOGY REPORT*  Clinical Data: Dental caries, lung abscess.  ORTHOPANTOGRAM/PANORAMIC  Comparison: None.  Findings: Slight lucency at the base of the lone remaining right lower molar suggests early periapical abscess.  Missing mandibular and maxillary teeth.  IMPRESSION: Suspect early periapical abscess adjacent to the lone remaining right lower molar.   Original Report Authenticated By: Charlett Nose, M.D.     Assessment: She is improving on empiric levofloxacin for cavitary pneumonia of unknown cause. The Gram and fungal stains from her bronchoscopy specimen did not reveal any organisms. AFB stain is pending but I doubt that this is tuberculosis. All cultures are negative so far. I am comfortable discharging her home on levofloxacin. I will arrange followup in our clinic within the next week.  Plan: 1. Discharged home on levofloxacin and plan for 4 weeks of therapy total 2. Followup in [facility] within one week  Cliffton Asters, MD [facility] [phone] pager   [phone] cell 01/28/2013, 12:37 PM

## 2013-01-28 NOTE — Progress Notes (Addendum)
PATIENT DETAILS Name: [name] Age: 42 y.o. Sex: female Date of Birth: Feb 04, 1971 Admit Date: 01/22/2013 Admitting Physician Hillary Bow, DO PCP:No primary provider on file.  Subjective: Very anxious to go home  Assessment/Plan: 1. Right upper lobe Cavitary lung lesion, left lower lobe consolidation: - Remains afebrile, leukocytosis resolved.  -Etiology unclear. Concern for tuberculosis or atypical infection. - Continue empiric Levaquin, airborne isolation. HIV nonreactive.  -Appreciate Pulmonary evaluation, patient is s/p bronchoscopy 2/11-awaiting results from BAL.   2. ADHD: - Continue Adderall.  3. Anxiety, depression: - Continue Cymbalta and Klonopin  4. History of superficial wound infection - following lumbar decompression with associated staph bacteremia 2008.  5. 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  -Anterior mediastinal mass, hilar lymphadenopathy and hepatosplenomegaly was noted. Followup CT was recommended but patient did not pursue-however CT Chest done this admit-did not show any lymphadenopathty  6. Early dental abscess -no fever or swelling -this will need to be addressed in the outpatient setting-patient will follow up with her primary dentiset   Disposition: Remain inpatient-till BAL results come in  DVT Prophylaxis: Prophylactic  Heparin  Code Status: Full code   Procedures:  None  CONSULTS:  pulmonary/intensive care and ID  PHYSICAL EXAM: Vital signs in last 24 hours: Filed Vitals:   01/27/13 1410 01/27/13 1453 01/27/13 2100 01/28/13 0603  BP: 99/63 96/53 95/58  89/46  Pulse:  77 64 63  Temp:  98.8 F (37.1 C) 98 F (36.7 C) 97.7 F (36.5 C)  TempSrc:  Oral Oral Oral  Resp: 23 18 12 15   Height:      Weight:      SpO2: 95% 97% 99% 99%    Weight change:  Body mass index is 17.87 kg/(m^2).   Gen Exam: Awake and alert with clear  speech.   Neck: Supple, No JVD.   Chest: B/L Clear.   CVS: S1 S2 Regular, no murmurs.  Abdomen: soft, BS +, non tender, non distended.  Extremities: no edema, lower extremities warm to touch. Neurologic: Non Focal.   Skin: No Rash.   Wounds: N/A.    Intake/Output from previous day:  Intake/Output Summary (Last 24 hours) at 01/28/13 1058 Last data filed at 01/27/13 1300  Gross per 24 hour  Intake      0 ml  Output      0 ml  Net      0 ml     LAB RESULTS: CBC  Recent Labs Lab 01/22/13 1746 01/22/13 2140 01/23/13 0510 01/24/13 0544 01/25/13 0430  WBC 12.4* 12.3* 14.6* 7.4 6.1  HGB 13.0 12.5 12.2 11.6* 12.0  HCT 37.0 36.4 34.3* 34.5* 35.2*  PLT 427* 390 370 394 409*  MCV 92.5 93.1 92.0 93.0 93.1  MCH 32.5 32.0 32.7 31.3 31.7  MCHC 35.1 34.3 35.6 33.6 34.1  RDW 12.3 12.4 12.4 12.4 12.4    Chemistries   Recent Labs Lab 01/22/13 1746 01/22/13 2140 01/23/13 0510  NA 135  --  131*  K 4.0  --  4.2  CL 97  --  98  CO2 27  --  24  GLUCOSE 96  --  114*  BUN 11  --  9  CREATININE 0.65 0.68 0.59  CALCIUM 9.6  --  9.3    CBG: No results found for this basename: GLUCAP,  in the last 168 hours  GFR Estimated Creatinine Clearance: 71.9 ml/min (by C-G formula based on Cr of 0.59).  Coagulation profile No results found for this basename: INR, PROTIME,  in the last 168 hours  Cardiac Enzymes No results found for this basename: CK, CKMB, TROPONINI, MYOGLOBIN,  in the last 168 hours  No components found with this basename: POCBNP,  No results found for this basename: DDIMER,  in the last 72 hours No results found for this basename: HGBA1C,  in the last 72 hours No results found for this basename: CHOL, HDL, LDLCALC, TRIG, CHOLHDL, LDLDIRECT,  in the last 72 hours No results found for this basename: TSH, T4TOTAL, FREET3, T3FREE, THYROIDAB,  in the last 72 hours No results found for this basename: VITAMINB12, FOLATE, FERRITIN, TIBC, IRON, RETICCTPCT,  in the last 72  hours No results found for this basename: LIPASE, AMYLASE,  in the last 72 hours  Urine Studies No results found for this basename: UACOL, UAPR, USPG, UPH, UTP, UGL, UKET, UBIL, UHGB, UNIT, UROB, ULEU, UEPI, UWBC, URBC, UBAC, CAST, CRYS, UCOM, BILUA,  in the last 72 hours  MICROBIOLOGY: Recent Results (from the past 240 hour(s))  CULTURE, BLOOD (ROUTINE X 2)     Status: None   Collection Time    01/22/13  8:30 PM      Result Value Range Status   Specimen Description BLOOD ARM RIGHT   Final   Special Requests BOTTLES DRAWN AEROBIC AND ANAEROBIC 10CC EACH   Final   Culture  Setup Time 01/23/2013 03:21   Final   Culture     Final   Value:        BLOOD CULTURE RECEIVED NO GROWTH TO DATE CULTURE WILL BE HELD FOR 5 DAYS BEFORE ISSUING A FINAL NEGATIVE REPORT   Report Status PENDING   Incomplete  CULTURE, BLOOD (ROUTINE X 2)     Status: None   Collection Time    01/22/13  8:40 PM      Result Value Range Status   Specimen Description BLOOD HAND RIGHT   Final   Special Requests BOTTLES DRAWN AEROBIC AND ANAEROBIC [facility]   Final   Culture  Setup Time 01/23/2013 03:20   Final   Culture     Final   Value:        BLOOD CULTURE RECEIVED NO GROWTH TO DATE CULTURE WILL BE HELD FOR 5 DAYS BEFORE ISSUING A FINAL NEGATIVE REPORT   Report Status PENDING   Incomplete  MRSA PCR SCREENING     Status: None   Collection Time    01/23/13  1:27 AM      Result Value Range Status   MRSA by PCR NEGATIVE  NEGATIVE Final   Comment:            The GeneXpert MRSA Assay (FDA     approved for NASAL specimens     only), is one component of a     comprehensive MRSA colonization     surveillance program. It is not     intended to diagnose MRSA     infection nor to guide or     monitor treatment for     MRSA infections.  LEGIONELLA CULTURE     Status: None   Collection Time    01/27/13  2:01 PM      Result Value Range Status   Specimen Description BRONCHIAL WASHINGS   Final   Special Requests RUL   Final    Culture     Final   Value: NO LEGIONELLA ISOLATED, CULTURE IN PROGRESS FOR 5 DAYS   Report Status  PENDING   Incomplete  CULTURE, RESPIRATORY (NON-EXPECTORATED)     Status: None   Collection Time    01/27/13  2:01 PM      Result Value Range Status   Specimen Description BRONCHIAL WASHINGS   Final   Special Requests RUL   Final   Gram Stain     Final   Value: RARE WBC PRESENT, PREDOMINANTLY PMN     NO SQUAMOUS EPITHELIAL CELLS SEEN     NO ORGANISMS SEEN   Culture Culture reincubated for better growth   Final   Report Status PENDING   Incomplete    RADIOLOGY STUDIES/RESULTS: Dg Orthopantogram  01/26/2013  *RADIOLOGY REPORT*  Clinical Data: Dental caries, lung abscess.  ORTHOPANTOGRAM/PANORAMIC  Comparison: None.  Findings: Slight lucency at the base of the lone remaining right lower molar suggests early periapical abscess.  Missing mandibular and maxillary teeth.  IMPRESSION: Suspect early periapical abscess adjacent to the lone remaining right lower molar.   Original Report Authenticated By: Charlett Nose, M.D.    Dg Chest 2 View  01/22/2013  *RADIOLOGY REPORT*  Clinical Data: Chest pain, cough, headaches normal cardiac silhouette and mediastinal contours.  CHEST - 2 VIEW  Comparison: 10/10/2007; 10/03/2007;  Findings: Grossly unchanged cardiac silhouette and mediastinal contours.  Interval development of air and fluid containing opacity within the right lung apex measuring approximately 4.0 x 4.1 cm. There is a smaller possible air fluid containing opacity within the expected location of the superior segment left lower lobe.  No pleural effusion or pneumothorax.  Unchanged bones.  IMPRESSION: Overall findings worrisome for multifocal possibly cavitary infection. Given upper lobe predominance, atypical etiologies are not excluded.  Further evaluation with chest CT may be performed as clinically indicated.   Original Report Authenticated By: Tacey Ruiz, MD    Ct Chest W Contrast  01/22/2013   *RADIOLOGY REPORT*  Clinical Data: Fever, chills, and flu-like symptoms beginning week ago.  Last night developed acute chest pain.  Dry cough.  Short of breath.  Headache.  Cavitary lesions on chest x-ray.  CT CHEST WITH CONTRAST  Technique:  Multidetector CT imaging of the chest was performed following the standard protocol during bolus administration of intravenous contrast.  Contrast: 80mL OMNIPAQUE IOHEXOL 300 MG/ML  SOLN  Comparison: CT chest 03/21/2006.  Chest x-ray 01/22/2013.  Findings: Since the previous study, there is interval development of a cavitary mass in the right upper lung measuring about 4.3 cm diameter.  An air-fluid level is present and there is infiltration around the mass.  Another focal area of consolidation with tiny gas collection is demonstrated in the superior segment of the left lower lung measuring 3.9 x 2 cm.  These changes were not present on the previous study.  This could represent focal consolidation due to infectious process with necrotizing pneumonia, atypical infection such as TB, or multi focal mass lesions.  There are mild emphysematous changes in the apices.  No pleural effusions.  No significant lymphadenopathy in the chest.  Normal heart size. Normal caliber thoracic aorta.  Visualized portions of the upper abdominal organs appear unremarkable.  Degenerative changes in the thoracic spine.  No destructive or expansile bone lesions appreciated.  IMPRESSION: Cavitary mass or consolidation in the right upper lung.  Focal consolidation or mass in the superior segment of the left lower lung.  Differential diagnosis includes necrotizing pneumonia, atypical infectious process, or neoplasm.   Original Report Authenticated By: Burman Nieves, M.D.     MEDICATIONS: Scheduled Meds: . DULoxetine  60 mg Oral BID  . feeding supplement  237 mL Oral Q24H  . heparin  5,000 Units Subcutaneous Q8H  . levofloxacin  750 mg Oral QHS   Continuous Infusions:  PRN Meds:.clonazePAM,  morphine injection, oxyCODONE, oxyCODONE-acetaminophen  Antibiotics: Anti-infectives   Start     Dose/Rate Route Frequency Ordered Stop   01/25/13 2200  levofloxacin (LEVAQUIN) tablet 750 mg     750 mg Oral Daily at bedtime 01/25/13 1412     01/23/13 2200  levofloxacin (LEVAQUIN) IVPB 750 mg  Status:  Discontinued    Comments:  After cultures   750 mg 100 mL/hr over 90 Minutes Intravenous Every 24 hours 01/23/13 0917 01/25/13 1412   01/22/13 2030  levofloxacin (LEVAQUIN) IVPB 750 mg    Comments:  After cultures   750 mg 100 mL/hr over 90 Minutes Intravenous  Once 01/22/13 2017 01/22/13 2230       Jeoffrey Massed, MD  [facility] Pager:[phone]  If 7PM-7AM, please contact night-coverage [email or web address] Password [facility] 01/28/2013, 10:58 AM   LOS: 6 days

## 2013-01-29 LAB — CULTURE, RESPIRATORY W GRAM STAIN

## 2013-01-30 LAB — CULTURE, BLOOD (ROUTINE X 2): Culture: NO GROWTH

## 2013-02-02 LAB — LEGIONELLA CULTURE

## 2013-02-12 ENCOUNTER — Inpatient Hospital Stay: Payer: Medicare Other | Admitting: Infectious Disease

## 2013-02-26 LAB — FUNGUS CULTURE W SMEAR: Fungal Smear: NONE SEEN

## 2013-03-11 LAB — AFB CULTURE WITH SMEAR (NOT AT ARMC): Acid Fast Smear: NONE SEEN

## 2013-05-13 DIAGNOSIS — F319 Bipolar disorder, unspecified: Secondary | ICD-10-CM | POA: Diagnosis not present

## 2013-07-20 DIAGNOSIS — F319 Bipolar disorder, unspecified: Secondary | ICD-10-CM | POA: Diagnosis not present

## 2013-10-14 DIAGNOSIS — F319 Bipolar disorder, unspecified: Secondary | ICD-10-CM | POA: Diagnosis not present

## 2013-10-22 ENCOUNTER — Other Ambulatory Visit: Payer: Self-pay

## 2013-12-28 DIAGNOSIS — F319 Bipolar disorder, unspecified: Secondary | ICD-10-CM | POA: Diagnosis not present

## 2014-03-29 DIAGNOSIS — F319 Bipolar disorder, unspecified: Secondary | ICD-10-CM | POA: Diagnosis not present

## 2014-05-05 ENCOUNTER — Encounter (HOSPITAL_COMMUNITY): Payer: Self-pay | Admitting: Emergency Medicine

## 2014-05-05 ENCOUNTER — Emergency Department (HOSPITAL_COMMUNITY)
Admission: EM | Admit: 2014-05-05 | Discharge: 2014-05-06 | Disposition: A | Discharge status: Home / Self Care | Payer: Medicare Other | Attending: Emergency Medicine | Admitting: Emergency Medicine

## 2014-05-05 DIAGNOSIS — Z79899 Other long term (current) drug therapy: Secondary | ICD-10-CM | POA: Diagnosis not present

## 2014-05-05 DIAGNOSIS — F329 Major depressive disorder, single episode, unspecified: Secondary | ICD-10-CM | POA: Insufficient documentation

## 2014-05-05 DIAGNOSIS — L03221 Cellulitis of neck: Principal | ICD-10-CM

## 2014-05-05 DIAGNOSIS — F411 Generalized anxiety disorder: Secondary | ICD-10-CM | POA: Insufficient documentation

## 2014-05-05 DIAGNOSIS — Z880 Allergy status to penicillin: Secondary | ICD-10-CM | POA: Diagnosis not present

## 2014-05-05 DIAGNOSIS — R1310 Dysphagia, unspecified: Secondary | ICD-10-CM | POA: Insufficient documentation

## 2014-05-05 DIAGNOSIS — Z8619 Personal history of other infectious and parasitic diseases: Secondary | ICD-10-CM | POA: Diagnosis not present

## 2014-05-05 DIAGNOSIS — F3289 Other specified depressive episodes: Secondary | ICD-10-CM | POA: Insufficient documentation

## 2014-05-05 DIAGNOSIS — L0291 Cutaneous abscess, unspecified: Secondary | ICD-10-CM

## 2014-05-05 DIAGNOSIS — F909 Attention-deficit hyperactivity disorder, unspecified type: Secondary | ICD-10-CM | POA: Diagnosis not present

## 2014-05-05 DIAGNOSIS — L0211 Cutaneous abscess of neck: Secondary | ICD-10-CM | POA: Diagnosis not present

## 2014-05-05 DIAGNOSIS — F17200 Nicotine dependence, unspecified, uncomplicated: Secondary | ICD-10-CM | POA: Diagnosis not present

## 2014-05-05 NOTE — ED Notes (Signed)
Patient with large abcess to the left side of her neck.  Has had one before and has been diagnosed with MRSA in the past

## 2014-05-05 NOTE — ED Provider Notes (Signed)
CSN: [id number]     Arrival date & time 05/05/14  2128 History  This chart was scribed for Margarita Mail, PA, working with Tanna Furry, MD, by Marcha Dutton ED Scribe. This patient was seen in room TR11C/TR11C and the patient's care was started at 11:51 PM.    Chief Complaint  Patient presents with  . abcess     Patient is a 43 y.o. female presenting with abscess. The history is provided by the patient. No language interpreter was used.  Abscess Location:  Head/neck Head/neck abscess location:  L neck Abscess quality: induration, painful and weeping   Red streaking: no   Duration:  1 month Progression:  Worsening Pain details:    Quality:  Dull and aching   Severity:  Severe   Duration:  1 month   Timing:  Constant   Progression:  Worsening Chronicity:  Recurrent (Pt reports a h/o MRSA) Context: not diabetes and not skin injury   Relieved by:  Nothing Worsened by:  Nothing tried Ineffective treatments:  Draining/squeezing and warm compresses Associated symptoms: no fatigue, no fever, no headaches and no nausea   Risk factors: hx of MRSA and prior abscess     Past Medical History  Diagnosis Date  . Hx of staphylococcal septicemia   . ADHD (attention deficit hyperactivity disorder)   . Anxiety   . Depression    Past Surgical History  Procedure Laterality Date  . Back surgery    . Left elbow surgery    . Video bronchoscopy N/A 01/27/2013    Procedure: VIDEO BRONCHOSCOPY WITH FLUORO;  Surgeon: Elsie Stain, MD;  Location: [place];  Service: Cardiopulmonary;  Laterality: N/A;  . Abdominal hysterectomy      History reviewed. No pertinent family history. History  Substance Use Topics  . Smoking status: Current Some Day Smoker -- 0.50 packs/day    Types: Cigarettes  . Smokeless tobacco: Never Used  . Alcohol Use: No    OB History   Grav Para Term Preterm Abortions TAB SAB Ect Mult Living                  Review of Systems  Constitutional: Negative  for fever and fatigue.  HENT: Positive for trouble swallowing (pain with swallowing).   Gastrointestinal: Negative for nausea.  Skin:       Abscess to the left side of the neck  Neurological: Negative for headaches.     Allergies  Penicillins  Home Medications   Prior to Admission medications   Medication Sig Start Date End Date Taking? Authorizing Provider  amphetamine-dextroamphetamine (ADDERALL) 20 MG tablet Take 20 mg by mouth daily as needed (ADD).    Yes Historical Provider, MD  Aspirin-Salicylamide-Caffeine (BC HEADACHE PO) Take 2 packets by mouth 3 (three) times daily as needed. For pain   Yes Historical Provider, MD  clonazePAM (KLONOPIN) 1 MG tablet Take 1 mg by mouth 2 (two) times daily as needed. For anxiety   Yes Historical Provider, MD  DULoxetine (CYMBALTA) 60 MG capsule Take 60 mg by mouth 2 (two) times daily.   Yes Historical Provider, MD  gabapentin (NEURONTIN) 800 MG tablet Take 800 mg by mouth 2 (two) times daily.   Yes Historical Provider, MD    Triage Vitals: BP 129/88  Pulse 102  Temp(Src) 98.4 F (36.9 C) (Oral)  Resp 18  Ht 5\' 5"  (1.651 m)  Wt 120 lb (54.432 kg)  BMI 19.97 kg/m2  SpO2 99%   Physical Exam  Nursing note and vitals reviewed. Constitutional: She is oriented to person, place, and time. She appears well-developed and well-nourished. No distress.  Awake, alert, nontoxic appearance.  HENT:  Head: Atraumatic.  Eyes: Right eye exhibits no discharge. Left eye exhibits no discharge.  Neck: Neck supple.  Nodule with central fluctuance   Pulmonary/Chest: Effort normal. She exhibits no tenderness.  Abdominal: Soft. There is no tenderness. There is no rebound.  Musculoskeletal: She exhibits no tenderness.  Baseline ROM, no obvious new focal weakness.  Neurological: She is alert and oriented to person, place, and time.  Mental status and motor strength appears baseline for patient and situation.  Skin: Skin is warm and dry. No rash noted. She  is not diaphoretic.  Psychiatric: She has a normal mood and affect. Her behavior is normal.    ED Course  Procedures (including critical care time)   DIAGNOSTIC STUDIES: Oxygen Saturation is 99% on RA, normal by my interpretation.     COORDINATION OF CARE: 11:52 PM- Pt advised of plan for treatment and pt agrees.   Labs Review Labs Reviewed - No data to display  Imaging Review No results found.   EKG Interpretation None      MDM   Final diagnoses:  Abscess    Patient seen in shared visit with Dr. Regenia Skeeter. I have concern for this lesion on her neck as there is central necrosis with  likely secondary infection. I feel that she abosolutelty needs DERM follow up and likely biopsy. I have advised the patient of this need and she agrees. Patient has an established relationship at [facility] dermatology. No concern for airway compromise.  I personally performed the services described in this documentation, which was scribed in my presence. The recorded information has been reviewed and is accurate.    Margarita Mail, PA-C 05/07/14 1125

## 2014-05-06 MED ORDER — OXYCODONE-ACETAMINOPHEN 5-325 MG PO TABS: 1.0000 | ORAL_TABLET | ORAL | Status: DC | PRN

## 2014-05-06 MED ORDER — SULFAMETHOXAZOLE-TRIMETHOPRIM 800-160 MG PO TABS: 1.0000 | ORAL_TABLET | Freq: Two times a day (BID) | ORAL | Status: DC

## 2014-05-06 NOTE — ED Notes (Signed)
EDPA Harris at bedside.

## 2014-05-06 NOTE — Discharge Instructions (Signed)
Abscess °An abscess is an infected area that contains a collection of pus and debris. It can occur in almost any part of the body. An abscess is also known as a furuncle or boil. °CAUSES  °An abscess occurs when tissue gets infected. This can occur from blockage of oil or sweat glands, infection of hair follicles, or a minor injury to the skin. As the body tries to fight the infection, pus collects in the area and creates pressure under the skin. This pressure causes pain. People with weakened immune systems have difficulty fighting infections and get certain abscesses more often.  °SYMPTOMS °Usually an abscess develops on the skin and becomes a painful mass that is red, warm, and tender. If the abscess forms under the skin, you may feel a moveable soft area under the skin. Some abscesses break open (rupture) on their own, but most will continue to get worse without care. The infection can spread deeper into the body and eventually into the bloodstream, causing you to feel ill.  °DIAGNOSIS  °Your caregiver will take your medical history and perform a physical exam. A sample of fluid may also be taken from the abscess to determine what is causing your infection. °TREATMENT  °Your caregiver may prescribe antibiotic medicines to fight the infection. However, taking antibiotics alone usually does not cure an abscess. Your caregiver may need to make a small cut (incision) in the abscess to drain the pus. In some cases, gauze is packed into the abscess to reduce pain and to continue draining the area. °HOME CARE INSTRUCTIONS  °· Only take over-the-counter or prescription medicines for pain, discomfort, or fever as directed by your caregiver. °· If you were prescribed antibiotics, take them as directed. Finish them even if you start to feel better. °· If gauze is used, follow your caregiver's directions for changing the gauze. °· To avoid spreading the infection: °· Keep your draining abscess covered with a  bandage. °· Wash your hands well. °· Do not share personal care items, towels, or whirlpools with others. °· Avoid skin contact with others. °· Keep your skin and clothes clean around the abscess. °· Keep all follow-up appointments as directed by your caregiver. °SEEK MEDICAL CARE IF:  °· You have increased pain, swelling, redness, fluid drainage, or bleeding. °· You have muscle aches, chills, or a general ill feeling. °· You have a fever. °MAKE SURE YOU:  °· Understand these instructions. °· Will watch your condition. °· Will get help right away if you are not doing well or get worse. °Document Released: 09/12/2005 Document Revised: 06/03/2012 Document Reviewed: 02/15/2012 °[employer]® Patient Information ©2014 [employer]. ° °Cellulitis °Cellulitis is an infection of the skin and the tissue beneath it. The infected area is usually red and tender. Cellulitis occurs most often in the arms and lower legs.  °CAUSES  °Cellulitis is caused by bacteria that enter the skin through cracks or cuts in the skin. The most common types of bacteria that cause cellulitis are Staphylococcus and Streptococcus. °SYMPTOMS  °· Redness and warmth. °· Swelling. °· Tenderness or pain. °· Fever. °DIAGNOSIS  °Your caregiver can usually determine what is wrong based on a physical exam. Blood tests may also be done. °TREATMENT  °Treatment usually involves taking an antibiotic medicine. °HOME CARE INSTRUCTIONS  °· Take your antibiotics as directed. Finish them even if you start to feel better. °· Keep the infected arm or leg elevated to reduce swelling. °· Apply a warm cloth to the affected area up to   4 times per day to relieve pain.  Only take over-the-counter or prescription medicines for pain, discomfort, or fever as directed by your caregiver.  Keep all follow-up appointments as directed by your caregiver. SEEK MEDICAL CARE IF:   You notice red streaks coming from the infected area.  Your red area gets larger or turns dark in  color.  Your bone or joint underneath the infected area becomes painful after the skin has healed.  Your infection returns in the same area or another area.  You notice a swollen bump in the infected area.  You develop new symptoms. SEEK IMMEDIATE MEDICAL CARE IF:   You have a fever.  You feel very sleepy.  You develop vomiting or diarrhea.  You have a general ill feeling (malaise) with muscle aches and pains. MAKE SURE YOU:   Understand these instructions.  Will watch your condition.  Will get help right away if you are not doing well or get worse. Document Released: 09/12/2005 Document Revised: 06/03/2012 Document Reviewed: 02/18/2012 [facility] Patient Information 2014 [employer]. Acetaminophen; Oxycodone tablets What is this medicine? ACETAMINOPHEN; OXYCODONE (a set a MEE noe fen; ox i KOE done) is a pain reliever. It is used to treat mild to moderate pain. This medicine may be used for other purposes; ask your health care provider or pharmacist if you have questions. COMMON BRAND NAME(S): Endocet, Magnacet, Narvox, Percocet, Perloxx, Primalev, Primlev, Roxicet, Xolox What should I tell my health care provider before I take this medicine? They need to know if you have any of these conditions: -brain tumor -Crohn's disease, inflammatory bowel disease, or ulcerative colitis -drug abuse or addiction -head injury -heart or circulation problems -if you often drink alcohol -kidney disease or problems going to the bathroom -liver disease -lung disease, asthma, or breathing problems -an unusual or allergic reaction to acetaminophen, oxycodone, other opioid analgesics, other medicines, foods, dyes, or preservatives -pregnant or trying to get pregnant -breast-feeding How should I use this medicine? Take this medicine by mouth with a full glass of water. Follow the directions on the prescription label. Take your medicine at regular intervals. Do not take your medicine more  often than directed. Talk to your pediatrician regarding the use of this medicine in children. Special care may be needed. Patients over 39 years old may have a stronger reaction and need a smaller dose. Overdosage: If you think you have taken too much of this medicine contact a poison control center or emergency room at once. NOTE: This medicine is only for you. Do not share this medicine with others. What if I miss a dose? If you miss a dose, take it as soon as you can. If it is almost time for your next dose, take only that dose. Do not take double or extra doses. What may interact with this medicine? -alcohol -antihistamines -barbiturates like amobarbital, butalbital, butabarbital, methohexital, pentobarbital, phenobarbital, thiopental, and secobarbital -benztropine -drugs for bladder problems like solifenacin, trospium, oxybutynin, tolterodine, hyoscyamine, and methscopolamine -drugs for breathing problems like ipratropium and tiotropium -drugs for certain stomach or intestine problems like propantheline, homatropine methylbromide, glycopyrrolate, atropine, belladonna, and dicyclomine -general anesthetics like etomidate, ketamine, nitrous oxide, propofol, desflurane, enflurane, halothane, isoflurane, and sevoflurane -medicines for depression, anxiety, or psychotic disturbances -medicines for sleep -muscle relaxants -naltrexone -narcotic medicines (opiates) for pain -phenothiazines like perphenazine, thioridazine, chlorpromazine, mesoridazine, fluphenazine, prochlorperazine, promazine, and trifluoperazine -scopolamine -tramadol -trihexyphenidyl This list may not describe all possible interactions. Give your health care provider a list of all the medicines,  herbs, non-prescription drugs, or dietary supplements you use. Also tell them if you smoke, drink alcohol, or use illegal drugs. Some items may interact with your medicine. What should I watch for while using this medicine? Tell your  doctor or health care professional if your pain does not go away, if it gets worse, or if you have new or a different type of pain. You may develop tolerance to the medicine. Tolerance means that you will need a higher dose of the medication for pain relief. Tolerance is normal and is expected if you take this medicine for a long time. Do not suddenly stop taking your medicine because you may develop a severe reaction. Your body becomes used to the medicine. This does NOT mean you are addicted. Addiction is a behavior related to getting and using a drug for a non-medical reason. If you have pain, you have a medical reason to take pain medicine. Your doctor will tell you how much medicine to take. If your doctor wants you to stop the medicine, the dose will be slowly lowered over time to avoid any side effects. You may get drowsy or dizzy. Do not drive, use machinery, or do anything that needs mental alertness until you know how this medicine affects you. Do not stand or sit up quickly, especially if you are an older patient. This reduces the risk of dizzy or fainting spells. Alcohol may interfere with the effect of this medicine. Avoid alcoholic drinks. There are different types of narcotic medicines (opiates) for pain. If you take more than one type at the same time, you may have more side effects. Give your health care provider a list of all medicines you use. Your doctor will tell you how much medicine to take. Do not take more medicine than directed. Call emergency for help if you have problems breathing. The medicine will cause constipation. Try to have a bowel movement at least every 2 to 3 days. If you do not have a bowel movement for 3 days, call your doctor or health care professional. Do not take Tylenol (acetaminophen) or medicines that have acetaminophen with this medicine. Too much acetaminophen can be very dangerous. Many nonprescription medicines contain acetaminophen. Always read the labels  carefully to avoid taking more acetaminophen. What side effects may I notice from receiving this medicine? Side effects that you should report to your doctor or health care professional as soon as possible: -allergic reactions like skin rash, itching or hives, swelling of the face, lips, or tongue -breathing difficulties, wheezing -confusion -light headedness or fainting spells -severe stomach pain -unusually weak or tired -yellowing of the skin or the whites of the eyes  Side effects that usually do not require medical attention (report to your doctor or health care professional if they continue or are bothersome): -dizziness -drowsiness -nausea -vomiting This list may not describe all possible side effects. Call your doctor for medical advice about side effects. You may report side effects to FDA at 1-800-FDA-1088. Where should I keep my medicine? Keep out of the reach of children. This medicine can be abused. Keep your medicine in a safe place to protect it from theft. Do not share this medicine with anyone. Selling or giving away this medicine is dangerous and against the law. Store at room temperature between 20 and 25 degrees C (68 and 77 degrees F). Keep container tightly closed. Protect from light. This medicine may cause accidental overdose and death if it is taken by other adults, children, or  pets. Flush any unused medicine down the toilet to reduce the chance of harm. Do not use the medicine after the expiration date. NOTE: This sheet is a summary. It may not cover all possible information. If you have questions about this medicine, talk to your doctor, pharmacist, or health care provider.  2014, [employer]. (2013-07-27 13:17:35)

## 2014-05-06 NOTE — ED Notes (Signed)
Pt discharged home with all belongings, pt alert, oriented, and ambulatory upon discharge, 2 new rx prescribed, pt verbalizes understanding of discharge of instructions, pt driven home by family

## 2014-05-11 NOTE — ED Provider Notes (Signed)
Medical screening examination/treatment/procedure(s) were conducted as a shared visit with non-physician practitioner(s) and myself.  I personally evaluated the patient during the encounter.   EKG Interpretation None      Patient with lesion on neck that was drained. No systemic symptoms. Will have her f/u with Theora Gianotti, MD 05/11/14 [phone]

## 2014-05-17 DIAGNOSIS — L905 Scar conditions and fibrosis of skin: Secondary | ICD-10-CM | POA: Diagnosis not present

## 2014-05-17 DIAGNOSIS — D485 Neoplasm of uncertain behavior of skin: Secondary | ICD-10-CM | POA: Diagnosis not present

## 2014-05-17 DIAGNOSIS — W57XXXA Bitten or stung by nonvenomous insect and other nonvenomous arthropods, initial encounter: Secondary | ICD-10-CM | POA: Diagnosis not present

## 2014-05-23 DIAGNOSIS — S61509A Unspecified open wound of unspecified wrist, initial encounter: Secondary | ICD-10-CM | POA: Diagnosis not present

## 2014-05-23 DIAGNOSIS — X789XXA Intentional self-harm by unspecified sharp object, initial encounter: Secondary | ICD-10-CM | POA: Diagnosis not present

## 2014-05-23 DIAGNOSIS — S41009A Unspecified open wound of unspecified shoulder, initial encounter: Secondary | ICD-10-CM | POA: Diagnosis not present

## 2014-05-23 DIAGNOSIS — Z008 Encounter for other general examination: Secondary | ICD-10-CM | POA: Diagnosis not present

## 2014-05-24 DIAGNOSIS — F314 Bipolar disorder, current episode depressed, severe, without psychotic features: Secondary | ICD-10-CM | POA: Diagnosis not present

## 2014-05-25 DIAGNOSIS — F314 Bipolar disorder, current episode depressed, severe, without psychotic features: Secondary | ICD-10-CM | POA: Diagnosis not present

## 2014-05-26 DIAGNOSIS — F314 Bipolar disorder, current episode depressed, severe, without psychotic features: Secondary | ICD-10-CM | POA: Diagnosis not present

## 2014-05-30 ENCOUNTER — Emergency Department (HOSPITAL_COMMUNITY): Payer: Medicare Other

## 2014-05-30 ENCOUNTER — Encounter (HOSPITAL_COMMUNITY): Payer: Self-pay | Admitting: Emergency Medicine

## 2014-05-30 ENCOUNTER — Emergency Department (HOSPITAL_COMMUNITY)
Admission: EM | Admit: 2014-05-30 | Discharge: 2014-05-30 | Disposition: A | Discharge status: Home / Self Care | Payer: Medicare Other | Attending: Emergency Medicine | Admitting: Emergency Medicine

## 2014-05-30 DIAGNOSIS — F329 Major depressive disorder, single episode, unspecified: Secondary | ICD-10-CM | POA: Diagnosis not present

## 2014-05-30 DIAGNOSIS — S8990XA Unspecified injury of unspecified lower leg, initial encounter: Secondary | ICD-10-CM | POA: Diagnosis not present

## 2014-05-30 DIAGNOSIS — Y9389 Activity, other specified: Secondary | ICD-10-CM | POA: Diagnosis not present

## 2014-05-30 DIAGNOSIS — Y929 Unspecified place or not applicable: Secondary | ICD-10-CM | POA: Insufficient documentation

## 2014-05-30 DIAGNOSIS — F411 Generalized anxiety disorder: Secondary | ICD-10-CM | POA: Insufficient documentation

## 2014-05-30 DIAGNOSIS — M25579 Pain in unspecified ankle and joints of unspecified foot: Secondary | ICD-10-CM | POA: Insufficient documentation

## 2014-05-30 DIAGNOSIS — Z880 Allergy status to penicillin: Secondary | ICD-10-CM | POA: Diagnosis not present

## 2014-05-30 DIAGNOSIS — S93409A Sprain of unspecified ligament of unspecified ankle, initial encounter: Secondary | ICD-10-CM | POA: Insufficient documentation

## 2014-05-30 DIAGNOSIS — S99919A Unspecified injury of unspecified ankle, initial encounter: Secondary | ICD-10-CM | POA: Diagnosis present

## 2014-05-30 DIAGNOSIS — F909 Attention-deficit hyperactivity disorder, unspecified type: Secondary | ICD-10-CM | POA: Diagnosis not present

## 2014-05-30 DIAGNOSIS — Z79899 Other long term (current) drug therapy: Secondary | ICD-10-CM | POA: Diagnosis not present

## 2014-05-30 DIAGNOSIS — F3289 Other specified depressive episodes: Secondary | ICD-10-CM | POA: Diagnosis not present

## 2014-05-30 DIAGNOSIS — M7989 Other specified soft tissue disorders: Secondary | ICD-10-CM | POA: Diagnosis not present

## 2014-05-30 DIAGNOSIS — Z8619 Personal history of other infectious and parasitic diseases: Secondary | ICD-10-CM | POA: Insufficient documentation

## 2014-05-30 DIAGNOSIS — X500XXA Overexertion from strenuous movement or load, initial encounter: Secondary | ICD-10-CM | POA: Insufficient documentation

## 2014-05-30 DIAGNOSIS — F17200 Nicotine dependence, unspecified, uncomplicated: Secondary | ICD-10-CM | POA: Diagnosis not present

## 2014-05-30 MED ORDER — OXYCODONE-ACETAMINOPHEN 5-325 MG PO TABS
1.0000 | ORAL_TABLET | Freq: Once | ORAL | Status: AC
  Administered 2014-05-30: 1 via ORAL
  Filled 2014-05-30: qty 1

## 2014-05-30 MED ORDER — OXYCODONE-ACETAMINOPHEN 5-325 MG PO TABS: 1.0000 | ORAL_TABLET | ORAL | Status: DC | PRN

## 2014-05-30 NOTE — ED Notes (Signed)
Rt. Ankle swelling; twisted ankle while playing basketball. Took bc powder.

## 2014-05-30 NOTE — ED Provider Notes (Signed)
CSN: [id number]     Arrival date & time 05/30/14  0147 History   First MD Initiated Contact with Patient 05/30/14 0403     Chief Complaint  Patient presents with  . Ankle Pain  . Joint Swelling     (Consider location/radiation/quality/duration/timing/severity/associated sxs/prior Treatment) HPI  [name] is a 43 y.o. female he complains of inversion injury, right ankle playing basketball, tonight. No other injuries. She has worse pain with walking. There are no other known modifying factors.   Past Medical History  Diagnosis Date  . Hx of staphylococcal septicemia   . ADHD (attention deficit hyperactivity disorder)   . Anxiety   . Depression    Past Surgical History  Procedure Laterality Date  . Back surgery    . Left elbow surgery    . Video bronchoscopy N/A 01/27/2013    Procedure: VIDEO BRONCHOSCOPY WITH FLUORO;  Surgeon: Elsie Stain, MD;  Location: [place];  Service: Cardiopulmonary;  Laterality: N/A;  . Abdominal hysterectomy     History reviewed. No pertinent family history. History  Substance Use Topics  . Smoking status: Current Some Day Smoker -- 0.50 packs/day    Types: Cigarettes  . Smokeless tobacco: Never Used  . Alcohol Use: No   OB History   Grav Para Term Preterm Abortions TAB SAB Ect Mult Living                 Review of Systems  All other systems reviewed and are negative.     Allergies  Penicillins  Home Medications   Prior to Admission medications   Medication Sig Start Date End Date Taking? Authorizing Provider  amphetamine-dextroamphetamine (ADDERALL) 20 MG tablet Take 20 mg by mouth 3 (three) times daily as needed (ADD).    Yes Historical Provider, MD  Aspirin-Salicylamide-Caffeine (BC HEADACHE PO) Take 2 packets by mouth 3 (three) times daily as needed. For pain   Yes Historical Provider, MD  BusPIRone HCl (BUSPAR PO) Take 1 tablet by mouth 2 (two) times daily as needed (anxiety).   Yes Historical Provider, MD   clonazePAM (KLONOPIN) 1 MG tablet Take 1 mg by mouth 2 (two) times daily as needed. For anxiety   Yes Historical Provider, MD  DULoxetine (CYMBALTA) 60 MG capsule Take 60 mg by mouth 2 (two) times daily.   Yes Historical Provider, MD  gabapentin (NEURONTIN) 300 MG capsule Take 300 mg by mouth 2 (two) times daily.   Yes Historical Provider, MD  oxyCODONE-acetaminophen (PERCOCET/ROXICET) 5-325 MG per tablet Take 1 tablet by mouth every 4 (four) hours as needed for severe pain. 05/30/14   Richarda Blade, MD   BP 115/53  Pulse 98  Temp(Src) 97.9 F (36.6 C) (Oral)  Resp 16  Ht 5\' 5"  (1.651 m)  Wt 126 lb (57.153 kg)  BMI 20.97 kg/m2  SpO2 98% Physical Exam  Nursing note and vitals reviewed. Constitutional: She is oriented to person, place, and time. She appears well-developed and well-nourished.  HENT:  Head: Normocephalic and atraumatic.  Eyes: Conjunctivae and EOM are normal. Pupils are equal, round, and reactive to light.  Neck: Normal range of motion and phonation normal. Neck supple.  Cardiovascular: Normal rate.   Pulmonary/Chest: Effort normal. She exhibits no tenderness.  Musculoskeletal:  Tender with swelling of left lateral ankle. No deformity or gross instability.  Neurological: She is alert and oriented to person, place, and time. She exhibits normal muscle tone.  Skin: Skin is warm and dry.  Psychiatric: She has  a normal mood and affect. Her behavior is normal. Judgment and thought content normal.    ED Course  Procedures (including critical care time) Medications  oxyCODONE-acetaminophen (PERCOCET/ROXICET) 5-325 MG per tablet 1 tablet (1 tablet Oral Given 05/30/14 0202)  oxyCODONE-acetaminophen (PERCOCET/ROXICET) 5-325 MG per tablet 1 tablet (1 tablet Oral Given 05/30/14 0420)    Patient Vitals for the past 24 hrs:  BP Temp Temp src Pulse Resp SpO2 Height Weight  05/30/14 0416 115/53 mmHg - - 98 - 98 % - -  05/30/14 0400 116/64 mmHg - - 91 - 97 % - -  05/30/14 0152  131/86 mmHg 97.9 F (36.6 C) Oral 96 16 98 % 5\' 5"  (1.651 m) 126 lb (57.153 kg)    4:55 AM Reevaluation with update and discussion. After initial assessment and treatment, an updated evaluation reveals she is more comfortable. After application of splint, she has normal sensation and motion of the toes of the left foot. She is able to ambulate, using her crutches.. [place] Review Labs Reviewed - No data to display  Imaging Review Dg Ankle Complete Right  05/30/2014   CLINICAL DATA:  Right ankle pain and swelling. Rolled right ankle while playing basketball.  EXAM: RIGHT ANKLE - COMPLETE 3+ VIEW  COMPARISON:  None.  FINDINGS: There is no evidence of fracture or dislocation. The ankle mortise is intact; the interosseous space is within normal limits. No talar tilt or subluxation is seen. An os peroneum is seen.  The joint spaces are preserved. Mild lateral soft tissue swelling is noted.  IMPRESSION: 1. No evidence of fracture or dislocation. 2. Os peroneum noted.   Electronically Signed   By: Garald Balding M.D.   On: 05/30/2014 02:35     EKG Interpretation None      MDM   Final diagnoses:  Ankle sprain   Ankle sprain without evidence for fracture.  Nursing Notes Reviewed/ Care Coordinated Applicable Imaging Reviewed Interpretation of Laboratory Data incorporated into ED treatment  The patient appears reasonably screened and/or stabilized for discharge and I doubt any other medical condition or other [facility] requiring further screening, evaluation, or treatment in the ED at this time prior to discharge.  Plan: Home Medications- Percocet; Home Treatments- ASO aplint; return here if the recommended treatment, does not improve the symptoms; Recommended follow up- Ortho prn   Richarda Blade, MD 05/30/14 [phone]

## 2014-05-30 NOTE — Discharge Instructions (Signed)

## 2014-05-30 NOTE — ED Notes (Signed)
Ortho paged to apply ASO ankle and to provide instructions for walking with crutches

## 2014-06-06 DIAGNOSIS — Z4802 Encounter for removal of sutures: Secondary | ICD-10-CM | POA: Diagnosis not present

## 2014-06-06 DIAGNOSIS — S61509A Unspecified open wound of unspecified wrist, initial encounter: Secondary | ICD-10-CM | POA: Diagnosis not present

## 2014-09-13 DIAGNOSIS — F319 Bipolar disorder, unspecified: Secondary | ICD-10-CM | POA: Diagnosis not present

## 2014-11-08 DIAGNOSIS — F329 Major depressive disorder, single episode, unspecified: Secondary | ICD-10-CM | POA: Diagnosis not present

## 2015-04-15 ENCOUNTER — Emergency Department (INDEPENDENT_AMBULATORY_CARE_PROVIDER_SITE_OTHER)
Admission: EM | Admit: 2015-04-15 | Discharge: 2015-04-15 | Disposition: A | Discharge status: Home / Self Care | Payer: Medicare Other | Source: Home / Self Care | Attending: Family Medicine | Admitting: Family Medicine

## 2015-04-15 ENCOUNTER — Encounter (HOSPITAL_COMMUNITY): Payer: Self-pay

## 2015-04-15 DIAGNOSIS — G44209 Tension-type headache, unspecified, not intractable: Secondary | ICD-10-CM

## 2015-04-15 MED ORDER — KETOROLAC TROMETHAMINE 30 MG/ML IJ SOLN
30.0000 mg | Freq: Once | INTRAMUSCULAR | Status: AC
  Administered 2015-04-15: 30 mg via INTRAMUSCULAR

## 2015-04-15 MED ORDER — AMITRIPTYLINE HCL 50 MG PO TABS: 50.0000 mg | ORAL_TABLET | Freq: Every day | ORAL | Status: DC

## 2015-04-15 MED ORDER — METHYLPREDNISOLONE SODIUM SUCC 125 MG IJ SOLR
125.0000 mg | Freq: Once | INTRAMUSCULAR | Status: AC
  Administered 2015-04-15: 125 mg via INTRAMUSCULAR

## 2015-04-15 MED ORDER — KETOROLAC TROMETHAMINE 30 MG/ML IJ SOLN
INTRAMUSCULAR | Status: AC
  Filled 2015-04-15: qty 1

## 2015-04-15 MED ORDER — ONDANSETRON HCL 4 MG/2ML IJ SOLN
4.0000 mg | Freq: Once | INTRAMUSCULAR | Status: AC
  Administered 2015-04-15: 4 mg via INTRAMUSCULAR

## 2015-04-15 MED ORDER — METHYLPREDNISOLONE SODIUM SUCC 125 MG IJ SOLR
INTRAMUSCULAR | Status: AC
  Filled 2015-04-15: qty 2

## 2015-04-15 NOTE — ED Provider Notes (Signed)
CSN: [id number]     Arrival date & time 04/15/15  1503 History   First MD Initiated Contact with Patient 04/15/15 1631     Chief Complaint  Patient presents with  . Headache   (Consider location/radiation/quality/duration/timing/severity/associated sxs/prior Treatment) Patient is a 44 y.o. female presenting with headaches. The history is provided by the patient.  Headache Pain location:  Generalized Radiates to:  Does not radiate Severity currently:  8/10 Severity at highest:  8/10 Onset quality:  Gradual Progression:  Unchanged Chronicity:  Chronic Similar to prior headaches: yes   Context: emotional stress   Ineffective treatments:  Cold packs and NSAIDs Associated symptoms: nausea and neck pain   Associated symptoms: no abdominal pain, no blurred vision, no dizziness, no fever, no focal weakness, no numbness, no photophobia, no seizures, no vomiting and no weakness     Past Medical History  Diagnosis Date  . Hx of staphylococcal septicemia   . ADHD (attention deficit hyperactivity disorder)   . Anxiety   . Depression    Past Surgical History  Procedure Laterality Date  . Back surgery    . Left elbow surgery    . Video bronchoscopy N/A 01/27/2013    Procedure: VIDEO BRONCHOSCOPY WITH FLUORO;  Surgeon: Elsie Stain, MD;  Location: [place];  Service: Cardiopulmonary;  Laterality: N/A;  . Abdominal hysterectomy     History reviewed. No pertinent family history. History  Substance Use Topics  . Smoking status: Current Some Day Smoker -- 0.50 packs/day    Types: Cigarettes  . Smokeless tobacco: Never Used  . Alcohol Use: No   OB History    No data available     Review of Systems  Constitutional: Negative.  Negative for fever.  Eyes: Negative for blurred vision and photophobia.  Gastrointestinal: Positive for nausea. Negative for vomiting and abdominal pain.  Musculoskeletal: Positive for neck pain.  Neurological: Positive for headaches. Negative for  dizziness, focal weakness, seizures, facial asymmetry, weakness, light-headedness and numbness.    Allergies  Penicillins  Home Medications   Prior to Admission medications   Medication Sig Start Date End Date Taking? Authorizing Provider  amitriptyline (ELAVIL) 50 MG tablet Take 1 tablet (50 mg total) by mouth at bedtime. 04/15/15   Billy Fischer, MD  amphetamine-dextroamphetamine (ADDERALL) 20 MG tablet Take 20 mg by mouth 3 (three) times daily as needed (ADD).     Historical Provider, MD  Aspirin-Salicylamide-Caffeine (BC HEADACHE PO) Take 2 packets by mouth 3 (three) times daily as needed. For pain    Historical Provider, MD  BusPIRone HCl (BUSPAR PO) Take 1 tablet by mouth 2 (two) times daily as needed (anxiety).    Historical Provider, MD  clonazePAM (KLONOPIN) 1 MG tablet Take 1 mg by mouth 2 (two) times daily as needed. For anxiety    Historical Provider, MD  DULoxetine (CYMBALTA) 60 MG capsule Take 60 mg by mouth 2 (two) times daily.    Historical Provider, MD  gabapentin (NEURONTIN) 300 MG capsule Take 300 mg by mouth 2 (two) times daily.    Historical Provider, MD  oxyCODONE-acetaminophen (PERCOCET/ROXICET) 5-325 MG per tablet Take 1 tablet by mouth every 4 (four) hours as needed for severe pain. 05/30/14   Daleen Bo, MD   There were no vitals taken for this visit. Physical Exam  Constitutional: She is oriented to person, place, and time. She appears well-developed and well-nourished. No distress.  HENT:  Head: Normocephalic.  Right Ear: External ear normal.  Left Ear: External  ear normal.  Mouth/Throat: Oropharynx is clear and moist.  Eyes: Conjunctivae and EOM are normal. Pupils are equal, round, and reactive to light.  Neck: Normal range of motion. Neck supple.  Cardiovascular: Normal heart sounds.   Pulmonary/Chest: Breath sounds normal.  Abdominal: There is no tenderness.  Lymphadenopathy:    She has no cervical adenopathy.  Neurological: She is alert and oriented  to person, place, and time. No cranial nerve deficit. Coordination normal.  Skin: Skin is warm and dry.  Nursing note and vitals reviewed.   ED Course  Procedures (including critical care time) Labs Review Labs Reviewed - No data to display  Imaging Review No results found.   MDM   1. Emotional tension headache        Billy Fischer, MD 04/15/15 1659

## 2015-04-15 NOTE — ED Notes (Signed)
C/o HA of and on since her surgery for ruptured disc that developed complications . Has a psychiatrist in [place], but no regular MD in [place] area

## 2015-04-15 NOTE — Discharge Instructions (Signed)
See neurologist if further headache problem.

## 2015-06-14 DIAGNOSIS — F319 Bipolar disorder, unspecified: Secondary | ICD-10-CM | POA: Diagnosis not present

## 2015-11-15 DIAGNOSIS — K210 Gastro-esophageal reflux disease with esophagitis: Secondary | ICD-10-CM | POA: Diagnosis not present

## 2015-11-15 DIAGNOSIS — R51 Headache: Secondary | ICD-10-CM | POA: Diagnosis not present

## 2015-11-15 DIAGNOSIS — R0602 Shortness of breath: Secondary | ICD-10-CM | POA: Diagnosis not present

## 2015-11-15 DIAGNOSIS — F17200 Nicotine dependence, unspecified, uncomplicated: Secondary | ICD-10-CM | POA: Diagnosis not present

## 2015-11-15 DIAGNOSIS — R05 Cough: Secondary | ICD-10-CM | POA: Diagnosis not present

## 2015-11-15 DIAGNOSIS — R079 Chest pain, unspecified: Secondary | ICD-10-CM | POA: Diagnosis not present

## 2015-11-24 DIAGNOSIS — F902 Attention-deficit hyperactivity disorder, combined type: Secondary | ICD-10-CM | POA: Diagnosis not present

## 2015-11-24 DIAGNOSIS — F3113 Bipolar disorder, current episode manic without psychotic features, severe: Secondary | ICD-10-CM | POA: Diagnosis not present

## 2016-02-16 DIAGNOSIS — F902 Attention-deficit hyperactivity disorder, combined type: Secondary | ICD-10-CM | POA: Diagnosis not present

## 2016-02-16 DIAGNOSIS — F3170 Bipolar disorder, currently in remission, most recent episode unspecified: Secondary | ICD-10-CM | POA: Diagnosis not present

## 2016-03-14 ENCOUNTER — Emergency Department (HOSPITAL_COMMUNITY)
Admission: EM | Admit: 2016-03-14 | Discharge: 2016-03-15 | Disposition: A | Discharge status: Home / Self Care | Payer: Medicare Other | Attending: Emergency Medicine | Admitting: Emergency Medicine

## 2016-03-14 ENCOUNTER — Encounter (HOSPITAL_COMMUNITY): Payer: Self-pay | Admitting: Emergency Medicine

## 2016-03-14 ENCOUNTER — Emergency Department (HOSPITAL_COMMUNITY): Payer: Medicare Other

## 2016-03-14 DIAGNOSIS — Y9389 Activity, other specified: Secondary | ICD-10-CM | POA: Diagnosis not present

## 2016-03-14 DIAGNOSIS — Z8619 Personal history of other infectious and parasitic diseases: Secondary | ICD-10-CM | POA: Diagnosis not present

## 2016-03-14 DIAGNOSIS — Y92481 Parking lot as the place of occurrence of the external cause: Secondary | ICD-10-CM | POA: Insufficient documentation

## 2016-03-14 DIAGNOSIS — S161XXA Strain of muscle, fascia and tendon at neck level, initial encounter: Secondary | ICD-10-CM | POA: Diagnosis not present

## 2016-03-14 DIAGNOSIS — F329 Major depressive disorder, single episode, unspecified: Secondary | ICD-10-CM | POA: Insufficient documentation

## 2016-03-14 DIAGNOSIS — F17210 Nicotine dependence, cigarettes, uncomplicated: Secondary | ICD-10-CM | POA: Diagnosis not present

## 2016-03-14 DIAGNOSIS — F419 Anxiety disorder, unspecified: Secondary | ICD-10-CM | POA: Insufficient documentation

## 2016-03-14 DIAGNOSIS — Z7982 Long term (current) use of aspirin: Secondary | ICD-10-CM | POA: Insufficient documentation

## 2016-03-14 DIAGNOSIS — F909 Attention-deficit hyperactivity disorder, unspecified type: Secondary | ICD-10-CM | POA: Insufficient documentation

## 2016-03-14 DIAGNOSIS — S0990XA Unspecified injury of head, initial encounter: Secondary | ICD-10-CM | POA: Insufficient documentation

## 2016-03-14 DIAGNOSIS — Y998 Other external cause status: Secondary | ICD-10-CM | POA: Insufficient documentation

## 2016-03-14 DIAGNOSIS — R51 Headache: Secondary | ICD-10-CM

## 2016-03-14 DIAGNOSIS — Z880 Allergy status to penicillin: Secondary | ICD-10-CM | POA: Insufficient documentation

## 2016-03-14 DIAGNOSIS — Z79899 Other long term (current) drug therapy: Secondary | ICD-10-CM | POA: Insufficient documentation

## 2016-03-14 DIAGNOSIS — M542 Cervicalgia: Secondary | ICD-10-CM | POA: Diagnosis not present

## 2016-03-14 DIAGNOSIS — S199XXA Unspecified injury of neck, initial encounter: Secondary | ICD-10-CM | POA: Diagnosis not present

## 2016-03-14 DIAGNOSIS — R519 Headache, unspecified: Secondary | ICD-10-CM

## 2016-03-14 MED ORDER — SODIUM CHLORIDE 0.9 % IV BOLUS (SEPSIS)
1000.0000 mL | Freq: Once | INTRAVENOUS | Status: AC
  Administered 2016-03-14: 1000 mL via INTRAVENOUS

## 2016-03-14 MED ORDER — OXYCODONE-ACETAMINOPHEN 5-325 MG PO TABS
1.0000 | ORAL_TABLET | ORAL | Status: DC | PRN
  Administered 2016-03-14: 1 via ORAL

## 2016-03-14 MED ORDER — LORAZEPAM 2 MG/ML IJ SOLN
1.0000 mg | Freq: Once | INTRAMUSCULAR | Status: AC
  Administered 2016-03-14: 1 mg via INTRAVENOUS
  Filled 2016-03-14: qty 1

## 2016-03-14 MED ORDER — OXYCODONE-ACETAMINOPHEN 5-325 MG PO TABS
ORAL_TABLET | ORAL | Status: AC
  Filled 2016-03-14: qty 1

## 2016-03-14 MED ORDER — FENTANYL CITRATE (PF) 100 MCG/2ML IJ SOLN
50.0000 ug | Freq: Once | INTRAMUSCULAR | Status: AC
  Administered 2016-03-14: 50 ug via INTRAVENOUS
  Filled 2016-03-14: qty 2
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
## 2016-03-14 NOTE — ED Notes (Signed)
Patient transported to CT 

## 2016-03-15 DIAGNOSIS — S199XXA Unspecified injury of neck, initial encounter: Secondary | ICD-10-CM | POA: Diagnosis not present

## 2016-03-15 DIAGNOSIS — S0990XA Unspecified injury of head, initial encounter: Secondary | ICD-10-CM | POA: Diagnosis not present

## 2016-03-15 DIAGNOSIS — R51 Headache: Secondary | ICD-10-CM | POA: Diagnosis not present

## 2016-03-15 DIAGNOSIS — M542 Cervicalgia: Secondary | ICD-10-CM | POA: Diagnosis not present

## 2016-03-15 MED ORDER — TRAMADOL HCL 50 MG PO TABS: 50.0000 mg | ORAL_TABLET | Freq: Four times a day (QID) | ORAL | Status: DC | PRN

## 2016-03-15 MED ORDER — IBUPROFEN 800 MG PO TABS: 800.0000 mg | ORAL_TABLET | Freq: Three times a day (TID) | ORAL | Status: DC | PRN

## 2016-03-15 MED ORDER — PROCHLORPERAZINE EDISYLATE 5 MG/ML IJ SOLN
10.0000 mg | Freq: Once | INTRAMUSCULAR | Status: AC
  Administered 2016-03-15: 10 mg via INTRAVENOUS
  Filled 2016-03-15: qty 2

## 2016-03-15 MED ORDER — DIPHENHYDRAMINE HCL 50 MG/ML IJ SOLN
25.0000 mg | Freq: Once | INTRAMUSCULAR | Status: AC
  Administered 2016-03-15: 25 mg via INTRAVENOUS
  Filled 2016-03-15: qty 1

## 2016-03-15 MED ORDER — KETOROLAC TROMETHAMINE 30 MG/ML IJ SOLN
30.0000 mg | Freq: Once | INTRAMUSCULAR | Status: AC
  Administered 2016-03-15: 30 mg via INTRAVENOUS
  Filled 2016-03-15: qty 1

## 2016-03-15 NOTE — Discharge Instructions (Signed)
Return here as needed. Follow up with your doctor. °

## 2016-06-18 ENCOUNTER — Encounter (HOSPITAL_COMMUNITY): Payer: Self-pay | Admitting: Emergency Medicine

## 2016-06-18 ENCOUNTER — Emergency Department (HOSPITAL_COMMUNITY)
Admission: EM | Admit: 2016-06-18 | Discharge: 2016-06-19 | Disposition: A | Discharge status: Home / Self Care | Payer: Medicare Other | Attending: Emergency Medicine | Admitting: Emergency Medicine

## 2016-06-18 DIAGNOSIS — M5489 Other dorsalgia: Secondary | ICD-10-CM | POA: Diagnosis not present

## 2016-06-18 DIAGNOSIS — M545 Low back pain, unspecified: Secondary | ICD-10-CM

## 2016-06-18 DIAGNOSIS — F17210 Nicotine dependence, cigarettes, uncomplicated: Secondary | ICD-10-CM | POA: Insufficient documentation

## 2016-06-18 DIAGNOSIS — Z79899 Other long term (current) drug therapy: Secondary | ICD-10-CM | POA: Diagnosis not present

## 2016-06-18 DIAGNOSIS — R52 Pain, unspecified: Secondary | ICD-10-CM | POA: Diagnosis not present

## 2016-06-18 MED ORDER — CYCLOBENZAPRINE HCL 10 MG PO TABS: 5.0000 mg | ORAL_TABLET | Freq: Two times a day (BID) | ORAL | Status: DC | PRN

## 2016-06-18 MED ORDER — DEXAMETHASONE SODIUM PHOSPHATE 10 MG/ML IJ SOLN
10.0000 mg | Freq: Once | INTRAMUSCULAR | Status: AC
  Administered 2016-06-19: 10 mg via INTRAVENOUS
  Filled 2016-06-18: qty 1

## 2016-06-18 MED ORDER — OXYCODONE-ACETAMINOPHEN 5-325 MG PO TABS
1.0000 | ORAL_TABLET | Freq: Once | ORAL | Status: AC
Start: 2016-06-19 — End: 2016-06-19
  Administered 2016-06-19: 1 via ORAL
  Filled 2016-06-18: qty 1

## 2016-06-18 MED ORDER — TRAMADOL HCL 50 MG PO TABS
50.0000 mg | ORAL_TABLET | Freq: Four times a day (QID) | ORAL | Status: DC | PRN
Start: 2016-06-18 — End: 2018-01-31

## 2016-06-18 MED ORDER — PREDNISONE 10 MG PO TABS: 20.0000 mg | ORAL_TABLET | Freq: Every day | ORAL | Status: DC

## 2016-06-18 MED ORDER — CYCLOBENZAPRINE HCL 10 MG PO TABS
5.0000 mg | ORAL_TABLET | Freq: Once | ORAL | Status: AC
  Administered 2016-06-19: 5 mg via ORAL
  Filled 2016-06-18: qty 1

## 2016-06-18 NOTE — ED Notes (Signed)
Pt brought to ED by [facility] from home for 10/10 chronic back pain getting worse today, pt had a hx of L5-S1 disc problems, Toradol 30 mg IV and 4 mg of Zofran IV given, pt ambulating on the hallway with steady gait.

## 2016-06-18 NOTE — ED Provider Notes (Signed)
CSN: [id number]     Arrival date & time 06/18/16  2311 History   First MD Initiated Contact with Patient 06/18/16 2335     Chief Complaint  Patient presents with  . Back Pain     (Consider location/radiation/quality/duration/timing/severity/associated sxs/prior Treatment) HPI  Patient to the ER with complaints of back pain. She was brought in by EMS with 10/10 back pain that got worse today. She has hx of low back problems and hx of surgery to L5-S1. En route EMS gave 30 mg IV Toradol, Zofran IV. Patient says that she is not feeling any better. She ambulated in hallway with steady gait.  ROS: The patient denies confusion, diaphoresis, abdominal pains,gas, dysuria, abnormal  bleeding, genital discharge, fever, headache, weakness (general or focal), confusion, change of vision,  dysphagia, aphagia, shortness of breath, lower extremity swelling, rash, neck pain, chest pain, shortness of breath,  back pain.   Past Medical History  Diagnosis Date  . Hx of staphylococcal septicemia   . ADHD (attention deficit hyperactivity disorder)   . Anxiety   . Depression    Past Surgical History  Procedure Laterality Date  . Back surgery    . Left elbow surgery    . Video bronchoscopy N/A 01/27/2013    Procedure: VIDEO BRONCHOSCOPY WITH FLUORO;  Surgeon: Elsie Stain, MD;  Location: [place];  Service: Cardiopulmonary;  Laterality: N/A;  . Abdominal hysterectomy     History reviewed. No pertinent family history. Social History  Substance Use Topics  . Smoking status: Current Some Day Smoker -- 0.50 packs/day    Types: Cigarettes  . Smokeless tobacco: Never Used  . Alcohol Use: No   OB History    No data available     Review of Systems  Review of Systems All other systems negative except as documented in the HPI. All pertinent positives and negatives as reviewed in the HPI.   Allergies  Penicillins  Home Medications   Prior to Admission medications   Medication Sig Start  Date End Date Taking? Authorizing Provider  ALPRAZolam Duanne Moron) 1 MG tablet Take 1 mg by mouth 3 (three) times daily. 05/10/16  Yes Historical Provider, MD  amphetamine-dextroamphetamine (ADDERALL) 20 MG tablet Take 20 mg by mouth 2 (two) times daily. 05/10/16  Yes Historical Provider, MD  busPIRone (BUSPAR) 15 MG tablet Take 15 mg by mouth 3 (three) times daily. 02/16/16  Yes Historical Provider, MD  DULoxetine (CYMBALTA) 60 MG capsule Take 60 mg by mouth 2 (two) times daily.   Yes Historical Provider, MD  gabapentin (NEURONTIN) 800 MG tablet Take 800 mg by mouth 3 (three) times daily. 02/16/16 02/15/17 Yes Historical Provider, MD  amitriptyline (ELAVIL) 50 MG tablet Take 1 tablet (50 mg total) by mouth at bedtime. Patient not taking: Reported on 06/18/2016 04/15/15   Billy Fischer, MD  cyclobenzaprine (FLEXERIL) 10 MG tablet Take 0.5-1 tablets (5-10 mg total) by mouth 2 (two) times daily as needed. 06/18/16   Laterrance Nauta Carlota Raspberry, PA-C  ibuprofen (ADVIL,MOTRIN) 800 MG tablet Take 1 tablet (800 mg total) by mouth every 8 (eight) hours as needed. Patient not taking: Reported on 06/18/2016 03/15/16   Dalia Heading, PA-C  oxyCODONE-acetaminophen (PERCOCET/ROXICET) 5-325 MG per tablet Take 1 tablet by mouth every 4 (four) hours as needed for severe pain. Patient not taking: Reported on 06/18/2016 05/30/14   Daleen Bo, MD  predniSONE (DELTASONE) 10 MG tablet Take 2 tablets (20 mg total) by mouth daily. 06/18/16   Delos Haring, PA-C  traMADol (  ULTRAM) 50 MG tablet Take 1 tablet (50 mg total) by mouth every 6 (six) hours as needed. Patient not taking: Reported on 06/18/2016 03/15/16   Dalia Heading, PA-C  traMADol (ULTRAM) 50 MG tablet Take 1 tablet (50 mg total) by mouth every 6 (six) hours as needed. 06/18/16   Senay Sistrunk Carlota Raspberry, PA-C   BP 125/87 mmHg  Pulse 70  Temp(Src) 98 F (36.7 C) (Oral)  Resp 18  Ht 5\' 3"  (1.6 m)  Wt 57.153 kg  BMI 22.33 kg/m2  SpO2 97% Physical Exam  Constitutional: She appears  well-developed and well-nourished. No distress.  HENT:  Head: Normocephalic and atraumatic.  Eyes: Pupils are equal, round, and reactive to light.  Neck: Normal range of motion. Neck supple.  Cardiovascular: Normal rate and regular rhythm.   Pulmonary/Chest: Effort normal.  Abdominal: Soft.  Musculoskeletal:       Back:  Symmetrical and physiologic strength to bilateral lower extremities.  Neurosensory function adequate to both legs Skin color is normal. Skin is warm and moist.  No step off deformity appreciated and no midline bony tenderness.  Ambulatory  No crepitus, laceration, effusion, induration, lesions Pedal pulses are symmetrical and palpable bilaterally  Tenderness to palpation of paraspinal and midline of spine\  Neurological: She is alert.  Skin: Skin is warm and dry.  Nursing note and vitals reviewed.   ED Course  Procedures (including critical care time) Labs Review Labs Reviewed - No data to display  Imaging Review No results found. I have personally reviewed and evaluated these images and lab results as part of my medical decision-making.   EKG Interpretation None      MDM   Final diagnoses:  Bilateral low back pain without sciatica    45 y.o.[name]'s  with back pain.   No neurological deficits and normal neuro exam. No loss of bowel or bladder control. No concern for cauda equina at this time base on HPI and physical exam findings. No fever, night sweats, weight loss, h/o cancer, IVDU. The patient can walk with some discomfort.   Patient Plan 1. Medications: NSAIDs and/or muscle relaxer. Cont usual home medications unless otherwise directed. 2. Treatment: rest, drink plenty of fluids, gentle stretching as discussed, alternate ice and heat  3. Follow Up: Please followup with your primary doctor for discussion of your diagnoses and further evaluation after today's visit; if you do not have a primary care doctor use the resource guide provided  to find one  Advised to follow-up with the orthopedist if symptoms do not start to resolve in the next 2-3 days. If develop loss of bowel or urinary control return to the ED as soon as possible for further evaluation. To take the medications as prescribed as they can cause harm if not taken appropriately.   Vital signs are stable at discharge. Filed Vitals:   06/18/16 2318  BP: 125/87  Pulse: 70  Temp: 98 F (36.7 C)  Resp: 18    Patient/guardian has voiced understanding and agreed to follow-up with the PCP or specialist.        Delos Haring, PA-C 06/18/16 2358  April Palumbo, MD 06/19/16 [id number]

## 2016-06-18 NOTE — Discharge Instructions (Signed)

## 2016-06-19 DIAGNOSIS — M545 Low back pain: Secondary | ICD-10-CM | POA: Diagnosis not present

## 2016-06-24 ENCOUNTER — Encounter (HOSPITAL_COMMUNITY): Payer: Self-pay | Admitting: *Deleted

## 2016-06-24 ENCOUNTER — Emergency Department (HOSPITAL_COMMUNITY)
Admission: EM | Admit: 2016-06-24 | Discharge: 2016-06-24 | Disposition: A | Discharge status: Home / Self Care | Payer: Medicare Other | Attending: Emergency Medicine | Admitting: Emergency Medicine

## 2016-06-24 DIAGNOSIS — M545 Low back pain, unspecified: Secondary | ICD-10-CM

## 2016-06-24 DIAGNOSIS — F17210 Nicotine dependence, cigarettes, uncomplicated: Secondary | ICD-10-CM | POA: Insufficient documentation

## 2016-06-24 DIAGNOSIS — F329 Major depressive disorder, single episode, unspecified: Secondary | ICD-10-CM | POA: Diagnosis not present

## 2016-06-24 MED ORDER — LIDOCAINE 5 % EX PTCH: 1.0000 | MEDICATED_PATCH | CUTANEOUS | Status: DC

## 2016-06-24 MED ORDER — LIDOCAINE 5 % EX PTCH
1.0000 | MEDICATED_PATCH | CUTANEOUS | Status: DC
  Administered 2016-06-24: 1 via TRANSDERMAL
  Filled 2016-06-24: qty 1

## 2016-06-24 MED ORDER — KETOROLAC TROMETHAMINE 30 MG/ML IJ SOLN
30.0000 mg | Freq: Once | INTRAMUSCULAR | Status: AC
  Administered 2016-06-24: 30 mg via INTRAMUSCULAR
  Filled 2016-06-24: qty 1

## 2016-06-24 NOTE — ED Provider Notes (Signed)
CSN: [id number]     Arrival date & time 06/24/16  [id number] History   First MD Initiated Contact with Patient 06/24/16 [phone]     Chief Complaint  Patient presents with  . Back Pain    HPI   Patient Presents with concern of ongoing low back pain. Patient has a history of prior surgery L5 S1, notes that she has had ongoing pain for at least days, possibly weeks in this area, without radiation. Pain is severe, sharp, worse with motion. Patient was seen in an affiliated facility 5 days ago. She notes that tramadol, prednisone, Flexeril are not improving her condition substantially. She denies other new changes, including fevers, chills, chest pain, incontinence, weakness in either extremity lower. Patient is scheduled to see orthopedist in one week.   Past Medical History  Diagnosis Date  . Hx of staphylococcal septicemia   . ADHD (attention deficit hyperactivity disorder)   . Anxiety   . Depression    Past Surgical History  Procedure Laterality Date  . Back surgery    . Left elbow surgery    . Video bronchoscopy N/A 01/27/2013    Procedure: VIDEO BRONCHOSCOPY WITH FLUORO;  Surgeon: Elsie Stain, MD;  Location: [place];  Service: Cardiopulmonary;  Laterality: N/A;  . Abdominal hysterectomy     No family history on file. Social History  Substance Use Topics  . Smoking status: Current Some Day Smoker -- 0.50 packs/day    Types: Cigarettes  . Smokeless tobacco: Never Used  . Alcohol Use: No   OB History    No data available     Review of Systems  Constitutional:       Per HPI, otherwise negative  HENT:       Per HPI, otherwise negative  Respiratory:       Per HPI, otherwise negative  Cardiovascular:       Per HPI, otherwise negative  Gastrointestinal: Negative for vomiting.  Endocrine:       Negative aside from HPI  Genitourinary:       Neg aside from HPI   Musculoskeletal:       Per HPI, otherwise negative  Skin: Negative for rash.  Neurological: Negative for  weakness and numbness.      Allergies  Penicillins  Home Medications   Prior to Admission medications   Medication Sig Start Date End Date Taking? Authorizing Provider  ALPRAZolam Duanne Moron) 1 MG tablet Take 1 mg by mouth 3 (three) times daily. 05/10/16   Historical Provider, MD  amphetamine-dextroamphetamine (ADDERALL) 20 MG tablet Take 20 mg by mouth 2 (two) times daily. 05/10/16   Historical Provider, MD  busPIRone (BUSPAR) 15 MG tablet Take 15 mg by mouth 3 (three) times daily. 02/16/16   Historical Provider, MD  DULoxetine (CYMBALTA) 60 MG capsule Take 60 mg by mouth 2 (two) times daily.    Historical Provider, MD  gabapentin (NEURONTIN) 800 MG tablet Take 800 mg by mouth 3 (three) times daily. 02/16/16 02/15/17  Historical Provider, MD  lidocaine (LIDODERM) 5 % Place 1 patch onto the skin daily. Remove & Discard patch within 12 hours or as directed by MD 06/24/16   Carmin Muskrat, MD  predniSONE (DELTASONE) 10 MG tablet Take 2 tablets (20 mg total) by mouth daily. 06/18/16   Tiffany Carlota Raspberry, PA-C  traMADol (ULTRAM) 50 MG tablet Take 1 tablet (50 mg total) by mouth every 6 (six) hours as needed. 06/18/16   Tiffany Carlota Raspberry, PA-C   BP 144/126 mmHg  Pulse  126  Temp(Src) 99.3 F (37.4 C) (Oral)  Resp 16  SpO2 100% Physical Exam  Constitutional: She is oriented to person, place, and time. She appears well-developed and well-nourished. No distress.  HENT:  Head: Normocephalic and atraumatic.  Eyes: Pupils are equal, round, and reactive to light.  Neck: Normal range of motion. Neck supple.  Cardiovascular: Regular rhythm.  Tachycardia present.   Pulmonary/Chest: Effort normal.  Abdominal: Soft.  Musculoskeletal:       Back:  No appreciable tenderness to palpation about the left lower back, there is an obvious surgical scar, but no erythema, drainage, discharge.   Neurological: She is alert and oriented to person, place, and time. She displays no atrophy and no tremor. No cranial nerve deficit or  sensory deficit. She displays no seizure activity. Coordination normal.  Skin: Skin is warm and dry.  Psychiatric: Her mood appears anxious.  Nursing note and vitals reviewed.   ED Course  Procedures (including critical care time)   EMR reviewed including documentation from visit 5 days ago, with similar physical exam.   MDM   Final diagnoses:  Acute low back pain   Patient presents with concern of ongoing low back pain. Here she has no evidence for neurologic compromise, is awake and alert. Patient has mild tachycardia, likely secondary to her discomfort. Patient was started on a course of topical Lidoderm, as well as continued steroids. Patient has scheduled orthopedic follow-up in the coming days. Absent new neurologic symptoms, red flags, obvious evidence for infection, patient appropriate for further evaluation, management as an outpatient.  Carmin Muskrat, MD 06/24/16 [phone]

## 2016-06-24 NOTE — ED Notes (Signed)
Pt presents with severe back pain.  Pt was seen a few days ago for the same complaint.  Pt returned due to continued pain and the inability to get seen by ortho until July 17th.

## 2016-06-24 NOTE — ED Notes (Signed)
Pt left ED cursing, stating, "this is fucking ridiculous that I just have to go home and suffer." Pt ambulates w/o difficulty out of the dept.

## 2016-06-24 NOTE — Discharge Instructions (Signed)
Please be sure to follow-up with her orthopedic surgeon. You may call tomorrow to see if there is an earlier appointment available.  Return here for concerning changes in your condition.

## 2016-06-27 ENCOUNTER — Other Ambulatory Visit: Payer: Self-pay | Admitting: Surgery

## 2016-06-27 DIAGNOSIS — M5136 Other intervertebral disc degeneration, lumbar region: Secondary | ICD-10-CM | POA: Diagnosis not present

## 2016-06-27 DIAGNOSIS — M545 Low back pain: Secondary | ICD-10-CM

## 2016-07-05 ENCOUNTER — Other Ambulatory Visit: Payer: Medicare Other

## 2016-08-16 DIAGNOSIS — F411 Generalized anxiety disorder: Secondary | ICD-10-CM | POA: Diagnosis not present

## 2016-08-16 DIAGNOSIS — F3170 Bipolar disorder, currently in remission, most recent episode unspecified: Secondary | ICD-10-CM | POA: Diagnosis not present

## 2016-08-16 DIAGNOSIS — F902 Attention-deficit hyperactivity disorder, combined type: Secondary | ICD-10-CM | POA: Diagnosis not present

## 2018-01-30 ENCOUNTER — Emergency Department (HOSPITAL_COMMUNITY)
Admission: EM | Admit: 2018-01-30 | Discharge: 2018-01-30 | Disposition: A | Discharge status: Home / Self Care | Payer: Medicare Other | Attending: Emergency Medicine | Admitting: Emergency Medicine

## 2018-01-30 ENCOUNTER — Encounter (HOSPITAL_COMMUNITY): Payer: Self-pay | Admitting: Emergency Medicine

## 2018-01-30 ENCOUNTER — Emergency Department (HOSPITAL_COMMUNITY): Payer: Medicare Other

## 2018-01-30 DIAGNOSIS — Z79899 Other long term (current) drug therapy: Secondary | ICD-10-CM | POA: Insufficient documentation

## 2018-01-30 DIAGNOSIS — F909 Attention-deficit hyperactivity disorder, unspecified type: Secondary | ICD-10-CM | POA: Insufficient documentation

## 2018-01-30 DIAGNOSIS — I493 Ventricular premature depolarization: Secondary | ICD-10-CM

## 2018-01-30 DIAGNOSIS — R002 Palpitations: Secondary | ICD-10-CM

## 2018-01-30 DIAGNOSIS — R55 Syncope and collapse: Secondary | ICD-10-CM | POA: Diagnosis not present

## 2018-01-30 DIAGNOSIS — F17210 Nicotine dependence, cigarettes, uncomplicated: Secondary | ICD-10-CM | POA: Diagnosis not present

## 2018-01-30 DIAGNOSIS — R42 Dizziness and giddiness: Secondary | ICD-10-CM | POA: Diagnosis present

## 2018-01-30 LAB — URINALYSIS, ROUTINE W REFLEX MICROSCOPIC
BILIRUBIN URINE: NEGATIVE
Glucose, UA: NEGATIVE mg/dL
HGB URINE DIPSTICK: NEGATIVE
Ketones, ur: NEGATIVE mg/dL
Leukocytes, UA: NEGATIVE
NITRITE: NEGATIVE
PH: 5 (ref 5.0–8.0)
Protein, ur: NEGATIVE mg/dL
SPECIFIC GRAVITY, URINE: 1.012 (ref 1.005–1.030)

## 2018-01-30 LAB — CBC
HEMATOCRIT: 48.2 % — AB (ref 36.0–46.0)
HEMOGLOBIN: 16.8 g/dL — AB (ref 12.0–15.0)
MCH: 34.4 pg — ABNORMAL HIGH (ref 26.0–34.0)
MCHC: 34.9 g/dL (ref 30.0–36.0)
MCV: 98.6 fL (ref 78.0–100.0)
Platelets: 243 10*3/uL (ref 150–400)
RBC: 4.89 MIL/uL (ref 3.87–5.11)
RDW: 12.9 % (ref 11.5–15.5)
WBC: 9.3 10*3/uL (ref 4.0–10.5)

## 2018-01-30 LAB — I-STAT BETA HCG BLOOD, ED (MC, WL, AP ONLY)

## 2018-01-30 LAB — BASIC METABOLIC PANEL
ANION GAP: 14 (ref 5–15)
BUN: 6 mg/dL (ref 6–20)
CALCIUM: 9.3 mg/dL (ref 8.9–10.3)
CO2: 17 mmol/L — ABNORMAL LOW (ref 22–32)
Chloride: 105 mmol/L (ref 101–111)
Creatinine, Ser: 0.81 mg/dL (ref 0.44–1.00)
GLUCOSE: 160 mg/dL — AB (ref 65–99)
POTASSIUM: 3.8 mmol/L (ref 3.5–5.1)
SODIUM: 136 mmol/L (ref 135–145)

## 2018-01-30 LAB — I-STAT TROPONIN, ED
TROPONIN I, POC: 0 ng/mL (ref 0.00–0.08)
Troponin i, poc: 0 ng/mL (ref 0.00–0.08)

## 2018-01-30 MED ORDER — SODIUM CHLORIDE 0.9 % IV BOLUS (SEPSIS)
1000.0000 mL | Freq: Once | INTRAVENOUS | Status: AC
  Administered 2018-01-30: 1000 mL via INTRAVENOUS

## 2018-01-30 NOTE — ED Provider Notes (Signed)
[place] EMERGENCY DEPARTMENT Provider Note   CSN: [id number] Arrival date & time: 01/30/18  1551     History   Chief Complaint Chief Complaint  Patient presents with  . Chest Pain  . Loss of Consciousness    HPI [name] is a 47 y.o. female.  HPI Pt started having episodes of getting dizzy and lightheaded.  She felt shaky and weak.  They last a few seconds.  She has had two or three episodes .  SHe started having them a few months ago but today it happened for a few minutes.  Pt knows that she was shaking but she is not sure if she lost consciousness.  During the episode she couldn't speak and had trouble communicating.  The sx resolved now.  Past Medical History:  Diagnosis Date  . ADHD (attention deficit hyperactivity disorder)   . Anxiety   . Depression   . Hx of staphylococcal septicemia     Patient Active Problem List   Diagnosis Date Noted  . Pulmonary abscess ([place]) 01/26/2013  . Pneumonia, organism unspecified([place]) 01/26/2013  . 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 Depression 01/23/2013  . ADHD (attention deficit hyperactivity disorder) 01/23/2013  . Chronic back pain 01/23/2013  . Cavitary lesion of lung 01/22/2013  . 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 Hx of staphylococcal infection 11/19/2007    Past Surgical History:  Procedure Laterality Date  . ABDOMINAL HYSTERECTOMY    . BACK SURGERY    . left elbow surgery    . VIDEO BRONCHOSCOPY N/A 01/27/2013   Procedure: VIDEO BRONCHOSCOPY WITH FLUORO;  Surgeon: Elsie Stain, MD;  Location: [facility];  Service: Cardiopulmonary;  Laterality: N/A;    OB History    No data available       Home Medications    Prior to Admission medications   Medication Sig Start Date End Date Taking? Authorizing Provider  ALPRAZolam Duanne Moron) 1 MG tablet Take 1 mg by mouth 3 (three) times daily. 05/10/16   [provider]    amphetamine-dextroamphetamine (ADDERALL) 20 MG tablet Take 20 mg by mouth 2 (two) times daily. 05/10/16   [provider]  busPIRone (BUSPAR) 15 MG tablet Take 15 mg by mouth 3 (three) times daily. 02/16/16   [provider]  DULoxetine (CYMBALTA) 60 MG capsule Take 60 mg by mouth 2 (two) times daily.    [provider]  lidocaine (LIDODERM) 5 % Place 1 patch onto the skin daily. Remove & Discard patch within 12 hours or as directed by MD 06/24/16   Carmin Muskrat, MD  predniSONE (DELTASONE) 10 MG tablet Take 2 tablets (20 mg total) by mouth daily. 06/18/16   Delos Haring, PA-C  traMADol (ULTRAM) 50 MG tablet Take 1 tablet (50 mg total) by mouth every 6 (six) hours as needed. 06/18/16   Delos Haring, PA-C  amitriptyline (ELAVIL) 50 MG tablet Take 1 tablet (50 mg total) by mouth at bedtime. Patient not taking: Reported on 06/18/2016 04/15/15 06/24/16  Billy Fischer, MD    Family History History reviewed. No pertinent family history.  Social History Social History   Tobacco Use  . Smoking status: Current Some Day Smoker    Packs/day: 0.50    Types: Cigarettes  . Smokeless tobacco: Never Used  Substance Use Topics  . Alcohol use: No  . Drug use: No     Allergies   Penicillins   Review of Systems Review of Systems  All other systems reviewed and are negative.    Physical Exam Updated Vital Signs BP 115/75   Pulse 64   Temp 98.4 F (36.9 C) (Oral)   Resp 16   Ht 1.651 m (5\' 5" )   Wt 59 kg (130 lb)   SpO2 97%   BMI 21.63 kg/m   Physical Exam  Constitutional: She appears well-developed and well-nourished. No distress.  HENT:  Head: Normocephalic and atraumatic.  Right Ear: External ear normal.  Left Ear: External ear normal.  Eyes: Conjunctivae are normal. Right eye exhibits no discharge. Left eye exhibits no discharge. No scleral icterus.  Neck: Neck supple. No tracheal deviation present.  Cardiovascular: Normal rate, regular rhythm and  intact distal pulses.  Pulmonary/Chest: Effort normal and breath sounds normal. No stridor. No respiratory distress. She has no wheezes. She has no rales.  Abdominal: Soft. Bowel sounds are normal. She exhibits no distension. There is no tenderness. There is no rebound and no guarding.  Musculoskeletal: She exhibits no edema or tenderness.  Neurological: She is alert. She has normal strength. No cranial nerve deficit (no facial droop, extraocular movements intact, no slurred speech) or sensory deficit. She exhibits normal muscle tone. She displays no seizure activity. Coordination normal.  Skin: Skin is warm and dry. No rash noted.  Psychiatric: She has a normal mood and affect.  Nursing note and vitals reviewed.    ED Treatments / Results  Labs (all labs ordered are listed, but only abnormal results are displayed) Labs Reviewed  BASIC METABOLIC PANEL - Abnormal; Notable for the following components:      Result Value   CO2 17 (*)    Glucose, Bld 160 (*)    All other components within normal limits  CBC - Abnormal; Notable for the following components:   Hemoglobin 16.8 (*)    HCT 48.2 (*)    MCH 34.4 (*)    All other components within normal limits  URINALYSIS, ROUTINE W REFLEX MICROSCOPIC - Abnormal; Notable for the following components:   APPearance HAZY (*)    All other components within normal limits  I-STAT TROPONIN, ED  I-STAT BETA HCG BLOOD, ED ([facility], [facility], [facility] ONLY)  I-STAT TROPONIN, ED    EKG  EKG Interpretation  Date/Time:  Thursday January 30 2018 15:58:13 EST Ventricular Rate:  101 PR Interval:  124 QRS Duration: 72 QT Interval:  336 QTC Calculation: 435 R Axis:   84 Text Interpretation:  Sinus tachycardia with frequent Premature ventricular complexes Biatrial enlargement Abnormal ECG Since last tracing rate faster Confirmed by Dorie Rank [phone]) on 01/30/2018 7:39:28 PM       Radiology Dg Chest 2 View  Result Date: 01/30/2018 CLINICAL DATA:  Chest pain  EXAM: CHEST  2 VIEW COMPARISON:  01/22/2013 chest radiograph. FINDINGS: Stable cardiomediastinal silhouette with normal heart size. No pneumothorax. No pleural effusion. Lungs appear clear, with no acute consolidative airspace disease and no pulmonary edema. IMPRESSION: No active cardiopulmonary disease. Electronically Signed   By: Ilona Sorrel M.D.   On: 01/30/2018 16:41    Procedures Procedures (including critical care time)  Medications Ordered in ED Medications  sodium chloride 0.9 % bolus 1,000 mL (0 mLs Intravenous Stopped 01/30/18 2202)     Initial Impression / Assessment and Plan / ED Course  I have reviewed the triage vital signs and the nursing notes.  Pertinent labs & imaging results that were available during my care of the patient were reviewed by me and considered in my medical  decision making (see chart for details).   Patient presented to the emergency room for intermittent episodes of primarily palpitations and personable syncopal events.  Patient also has a history of seizures but she has not described any tonic-clonic activity or any episodes that would suggest a postictal state.  Patient does not have a primary care doctor so she came to the emergency room because of her worsening symptoms.  Patient's laboratory tests are reassuring.  She did have a decreased bicarbonate level but otherwise her renal function is normal.  She was given IV fluids and I recommended she have that lab rechecked.  Patient's EKG did show PVCs.  Possible that is causing some of her symptoms although that certainly would not contribute to the syncopal event.   Some of her sx may be related to anxiety as well.  I discussed the evaluation with the patient today.  I do not see any signs of any acute emergency medical condition.  I think she would benefit from further outpatient evaluation such as outpatient cardiac monitoring.  Final Clinical Impressions(s) / ED Diagnoses   Final diagnoses:  Syncope,  unspecified syncope type  PVC (premature ventricular contraction)  Palpitation    ED Discharge Orders    None       Dorie Rank, MD 01/30/18 2205

## 2018-01-30 NOTE — Discharge Instructions (Signed)
Follow-up with a primary care doctor for further evaluation as we discussed.  Consider seeing a cardiologist for further evaluation.  Return as needed for worsening symptoms

## 2018-01-30 NOTE — ED Triage Notes (Signed)
Patient presents to ED for assessment after multiple episodes of light-headedness, jerking, distorted memory, right leg numbness and spasms, chest pain, L5-S1 back pain (chronic in nature), morning emesis x 1 week, dark black stool.  Patient states she has been under a lot of duress due to her mother and daughter both dying in the past few months.

## 2018-01-31 ENCOUNTER — Other Ambulatory Visit: Payer: Self-pay

## 2018-01-31 ENCOUNTER — Encounter (HOSPITAL_COMMUNITY): Payer: Self-pay

## 2018-01-31 ENCOUNTER — Observation Stay (HOSPITAL_COMMUNITY)
Admission: EM | Admit: 2018-01-31 | Discharge: 2018-02-01 | Disposition: A | Discharge status: Home / Self Care | Payer: Medicare Other | Attending: Internal Medicine | Admitting: Internal Medicine

## 2018-01-31 DIAGNOSIS — F17210 Nicotine dependence, cigarettes, uncomplicated: Secondary | ICD-10-CM | POA: Insufficient documentation

## 2018-01-31 DIAGNOSIS — R062 Wheezing: Secondary | ICD-10-CM | POA: Insufficient documentation

## 2018-01-31 DIAGNOSIS — I493 Ventricular premature depolarization: Secondary | ICD-10-CM | POA: Diagnosis not present

## 2018-01-31 DIAGNOSIS — R079 Chest pain, unspecified: Secondary | ICD-10-CM | POA: Diagnosis not present

## 2018-01-31 DIAGNOSIS — R55 Syncope and collapse: Secondary | ICD-10-CM | POA: Insufficient documentation

## 2018-01-31 DIAGNOSIS — J449 Chronic obstructive pulmonary disease, unspecified: Secondary | ICD-10-CM | POA: Diagnosis not present

## 2018-01-31 DIAGNOSIS — Z79899 Other long term (current) drug therapy: Secondary | ICD-10-CM | POA: Insufficient documentation

## 2018-01-31 LAB — CBC
HCT: 44.1 % (ref 36.0–46.0)
Hemoglobin: 15.2 g/dL — ABNORMAL HIGH (ref 12.0–15.0)
MCH: 33.9 pg (ref 26.0–34.0)
MCHC: 34.5 g/dL (ref 30.0–36.0)
MCV: 98.2 fL (ref 78.0–100.0)
PLATELETS: 207 10*3/uL (ref 150–400)
RBC: 4.49 MIL/uL (ref 3.87–5.11)
RDW: 13.1 % (ref 11.5–15.5)
WBC: 8.4 10*3/uL (ref 4.0–10.5)

## 2018-01-31 LAB — RAPID URINE DRUG SCREEN, HOSP PERFORMED
Amphetamines: NOT DETECTED
Barbiturates: NOT DETECTED
Benzodiazepines: NOT DETECTED
COCAINE: NOT DETECTED
OPIATES: NOT DETECTED
Tetrahydrocannabinol: POSITIVE — AB

## 2018-01-31 LAB — MAGNESIUM: MAGNESIUM: 1.7 mg/dL (ref 1.7–2.4)

## 2018-01-31 LAB — COMPREHENSIVE METABOLIC PANEL
ALT: 20 U/L (ref 14–54)
AST: 24 U/L (ref 15–41)
Albumin: 3.7 g/dL (ref 3.5–5.0)
Alkaline Phosphatase: 68 U/L (ref 38–126)
Anion gap: 12 (ref 5–15)
BUN: 6 mg/dL (ref 6–20)
CHLORIDE: 107 mmol/L (ref 101–111)
CO2: 19 mmol/L — AB (ref 22–32)
CREATININE: 0.65 mg/dL (ref 0.44–1.00)
Calcium: 9.2 mg/dL (ref 8.9–10.3)
GFR calc non Af Amer: >60 mL/min (ref >60)
Glucose, Bld: 101 mg/dL — ABNORMAL HIGH (ref 65–99)
POTASSIUM: 4.7 mmol/L (ref 3.5–5.1)
Sodium: 138 mmol/L (ref 135–145)
Total Bilirubin: 0.7 mg/dL (ref 0.3–1.2)
Total Protein: 6.4 g/dL — ABNORMAL LOW (ref 6.5–8.1)

## 2018-01-31 LAB — TSH: TSH: 1.104 u[IU]/mL (ref 0.350–4.500)

## 2018-01-31 LAB — I-STAT TROPONIN, ED: TROPONIN I, POC: 0 ng/mL (ref 0.00–0.08)

## 2018-01-31 LAB — TROPONIN I: Troponin I: <0.03 ng/mL (ref <0.03)

## 2018-01-31 LAB — D-DIMER, QUANTITATIVE: D-Dimer, Quant: <0.27 ug/mL-FEU (ref 0.00–0.50)

## 2018-01-31 MED ORDER — ENOXAPARIN SODIUM 40 MG/0.4ML ~~LOC~~ SOLN
40.0000 mg | SUBCUTANEOUS | Status: DC
  Administered 2018-01-31: 40 mg via SUBCUTANEOUS
  Filled 2018-01-31: qty 0.4

## 2018-01-31 MED ORDER — GABAPENTIN 800 MG PO TABS
800.0000 mg | ORAL_TABLET | Freq: Three times a day (TID) | ORAL | Status: DC
  Filled 2018-01-31 (×2): qty 1

## 2018-01-31 MED ORDER — IPRATROPIUM-ALBUTEROL 0.5-2.5 (3) MG/3ML IN SOLN
3.0000 mL | Freq: Once | RESPIRATORY_TRACT | Status: AC
  Administered 2018-01-31: 3 mL via RESPIRATORY_TRACT
  Filled 2018-01-31: qty 3

## 2018-01-31 MED ORDER — ACETAMINOPHEN 650 MG RE SUPP: 650.0000 mg | Freq: Four times a day (QID) | RECTAL | Status: DC | PRN

## 2018-01-31 MED ORDER — NICOTINE 21 MG/24HR TD PT24
21.0000 mg | MEDICATED_PATCH | Freq: Once | TRANSDERMAL | Status: AC
  Administered 2018-01-31: 21 mg via TRANSDERMAL
  Filled 2018-01-31: qty 1

## 2018-01-31 MED ORDER — BUSPIRONE HCL 5 MG PO TABS
15.0000 mg | ORAL_TABLET | Freq: Three times a day (TID) | ORAL | Status: DC
Start: 2018-01-31 — End: 2018-02-01
  Administered 2018-01-31 – 2018-02-01 (×2): 15 mg via ORAL
  Filled 2018-01-31 (×2): qty 3

## 2018-01-31 MED ORDER — GABAPENTIN 400 MG PO CAPS
800.0000 mg | ORAL_CAPSULE | Freq: Three times a day (TID) | ORAL | Status: DC
Start: 2018-01-31 — End: 2018-02-01
  Administered 2018-01-31 – 2018-02-01 (×3): 800 mg via ORAL
  Filled 2018-01-31 (×3): qty 2

## 2018-01-31 MED ORDER — DULOXETINE HCL 60 MG PO CPEP
60.0000 mg | ORAL_CAPSULE | Freq: Two times a day (BID) | ORAL | Status: DC
  Administered 2018-01-31 – 2018-02-01 (×2): 60 mg via ORAL
  Filled 2018-01-31 (×2): qty 1

## 2018-01-31 MED ORDER — ACETAMINOPHEN 325 MG PO TABS
650.0000 mg | ORAL_TABLET | Freq: Four times a day (QID) | ORAL | Status: DC | PRN
  Administered 2018-01-31 – 2018-02-01 (×2): 650 mg via ORAL
  Filled 2018-01-31 (×2): qty 2

## 2018-01-31 MED ORDER — INFLUENZA VAC SPLIT QUAD 0.5 ML IM SUSY: 0.5000 mL | PREFILLED_SYRINGE | INTRAMUSCULAR | Status: DC

## 2018-01-31 MED ORDER — LORAZEPAM 1 MG PO TABS
1.0000 mg | ORAL_TABLET | Freq: Once | ORAL | Status: AC
  Administered 2018-01-31: 1 mg via ORAL
  Filled 2018-01-31: qty 1

## 2018-01-31 NOTE — ED Triage Notes (Signed)
Pt from home with palpations, pt was treated here yesterday. When EMS arrived pt was found in trigeminy which has resolved now pt in NSR now. Pt rates pain about a 2 out of 10

## 2018-01-31 NOTE — Progress Notes (Signed)
Pt arrived to 6E[facility] 22.  Denies chest pain.  C/O back/neck pain from fall yesterday.  States she has a blurry vision.  Idolina Primer, RN

## 2018-01-31 NOTE — ED Provider Notes (Signed)
[place] [facility] EMERGENCY DEPARTMENT Provider Note   CSN: [id number] Arrival date & time: 01/31/18  1115     History   Chief Complaint Chief Complaint  Patient presents with  . Chest Pain    HPI [name] is a 47 y.o. female with a h/o of staphylococcal septicemia, cavitary lesion of the lung, intraspinal abscess, mediastinal mass, pulmonary alveolitis, pulmonary abscess, pneumonia, COPD, anxiety, depression, and ADHD who presents to the emergency department with a chief complaint of chest pain.  The patient reports that she was sitting in bed talking on her phone when she suddenly felt diaphoretic, hot, clammy, and lightheaded.  She reports that her heart felt like it was racing so fast that her body was shaking all over with constant pressure-like non-radiating left-sided chest pain.  The episode lasted for approximately 10-15 minutes and resolved around the time that paramedics arrived.  All other symptoms have resolved, but she continues to endorse chest pain in the ED.  Pain is worse with taking deep breaths.  No alleviating factors.  EMS reports that the patient was found in trigeminy, which has now resolved, and she is now and normal sinus rhythm.  She denies fever, chills, visual changes, headache, changes in her vision or speech, nausea, vomiting, diarrhea, weakness, numbness, or tinnitus.  She is unsure if she was short of breath. She states that the episodes today and yesterday did not feel like her usual panic attacks or anxiety.  She had a similar episode that occurred yesterday while she was at work, but there was questionable syncope during the episode.  She was standing when the episode started.  She was thoroughly evaluated in the emergency department and EKG did show PVCs.  She was discharged home with follow-up to cardiology for further evaluation with cardiac monitoring.  She denies a history of IV drug use or recreational.  She is a current everyday  smoker, and smokes between 10-20 cigarettes daily.  She has a history of ADHD and used to take Adderall, but has not taken the medication in several months.  She takes Xanax as needed for anxiety.    No recent over-the-counter or herbal supplements.  She did take 1 BC powder yesterday for a headache.  She has an extensive history of cardiovascular disease in her family.  She reports that her father passed away from an MI at age 40 and her mother's side of the family also has extensive heart disease.    The history is provided by the patient. No language interpreter was used.  Chest Pain   Associated symptoms include diaphoresis, dizziness and palpitations. Pertinent negatives include no abdominal pain, no back pain, no cough, no fever, no headaches, no nausea, no numbness, no shortness of breath, no vomiting and no weakness.  Pertinent negatives for past medical history include no seizures.    Past Medical History:  Diagnosis Date  . ADHD (attention deficit hyperactivity disorder)   . Anxiety   . Depression   . Hx of staphylococcal septicemia     Patient Active Problem List   Diagnosis Date Noted  . Chest pain 01/31/2018  . GAD (generalized anxiety disorder) 08/16/2016  . Pulmonary abscess ([place]) 01/26/2013  . Pneumonia, organism unspecified([place]) 01/26/2013  . 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 Depression 01/23/2013  . ADHD (attention deficit hyperactivity disorder) 01/23/2013  . Chronic back pain 01/23/2013  . Cavitary lesion of lung 01/22/2013  . 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  Hx of staphylococcal infection 11/19/2007    Past Surgical History:  Procedure Laterality Date  . ABDOMINAL HYSTERECTOMY    . BACK SURGERY    . left elbow surgery    . VIDEO BRONCHOSCOPY N/A 01/27/2013   Procedure: VIDEO BRONCHOSCOPY WITH FLUORO;  Surgeon: Elsie Stain, MD;  Location: [facility];  Service: Cardiopulmonary;  Laterality: N/A;    OB  History    No data available       Home Medications    Prior to Admission medications   Medication Sig Start Date End Date Taking? Authorizing Provider  ALPRAZolam Duanne Moron) 1 MG tablet Take 1 mg by mouth 3 (three) times daily. 05/10/16  Yes [provider]  Ascorbic Acid Buffered (BUFFERED C POWDER PO) Take 1 Package by mouth as needed (pain).   Yes [provider]  bismuth subsalicylate (PEPTO BISMOL) 262 MG/15ML suspension Take 30 mLs by mouth every 6 (six) hours as needed for indigestion.   Yes [provider]  busPIRone (BUSPAR) 15 MG tablet Take 15 mg by mouth 3 (three) times daily. 02/16/16  Yes [provider]  DULoxetine (CYMBALTA) 60 MG capsule Take 60 mg by mouth 2 (two) times daily.   Yes [provider]  gabapentin (NEURONTIN) 800 MG tablet Take 800 mg by mouth. 12/26/16  Yes [provider]  lidocaine (LIDODERM) 5 % Place 1 patch onto the skin daily. Remove & Discard patch within 12 hours or as directed by MD Patient not taking: Reported on 01/31/2018 06/24/16   Carmin Muskrat, MD    Family History History reviewed. No pertinent family history.  Social History Social History   Tobacco Use  . Smoking status: Current Some Day Smoker    Packs/day: 0.50    Types: Cigarettes  . Smokeless tobacco: Never Used  Substance Use Topics  . Alcohol use: No  . Drug use: No     Allergies   Penicillins   Review of Systems Review of Systems  Constitutional: Positive for diaphoresis. Negative for activity change, chills and fever.  HENT: Negative for congestion.   Respiratory: Positive for chest tightness. Negative for cough and shortness of breath.   Cardiovascular: Positive for chest pain and palpitations.  Gastrointestinal: Negative for abdominal pain, diarrhea, nausea and vomiting.  Genitourinary: Negative for dysuria.  Musculoskeletal: Negative for arthralgias, back pain and myalgias.  Skin: Negative for color change and  rash.  Allergic/Immunologic: Negative for immunocompromised state.  Neurological: Positive for dizziness, syncope (yesterday) and light-headedness. Negative for seizures, speech difficulty, weakness, numbness and headaches.  Hematological: Does not bruise/bleed easily.  Psychiatric/Behavioral: Negative for confusion.   Physical Exam Updated Vital Signs BP 121/83   Pulse 86   Temp 98.3 F (36.8 C) (Oral)   Resp 17   Ht 5\' 5"  (1.651 m)   Wt 59 kg (130 lb)   SpO2 98%   BMI 21.63 kg/m   Physical Exam  Constitutional: She appears well-developed and well-nourished. No distress.  HENT:  Head: Normocephalic.  Eyes: Conjunctivae are normal.  Neck: Normal range of motion. Neck supple. No JVD present. No tracheal deviation present.  Cardiovascular: Normal rate, normal heart sounds and intact distal pulses. An irregular rhythm present. Exam reveals no gallop and no friction rub.  No murmur heard. Pulses:      Radial pulses are 2+ on the right side, and 2+ on the left side.       Dorsalis pedis pulses are 2+ on the right side, and 2+ on the  left side.       Posterior tibial pulses are 2+ on the right side, and 2+ on the left side.  Pulmonary/Chest: Effort normal. No stridor. No respiratory distress. She has wheezes. She has no rales. She exhibits no tenderness.  Abdominal: Soft. Bowel sounds are normal. She exhibits no distension and no mass. There is no tenderness. There is no rebound and no guarding. No hernia.  Musculoskeletal: Normal range of motion. She exhibits no edema, tenderness or deformity.  Neurological: She is alert.  Cranial nerves II through XII are grossly intact.  5 out of 5 strength of the bilateral upper and lower extremities.  Sensation is intact throughout.  Skin: Skin is warm. Capillary refill takes less than 2 seconds. No rash noted. She is not diaphoretic.  Psychiatric: Her behavior is normal.  Nursing note and vitals reviewed.    ED Treatments / Results   Labs (all labs ordered are listed, but only abnormal results are displayed) Labs Reviewed  CBC - Abnormal; Notable for the following components:      Result Value   Hemoglobin 15.2 (*)    All other components within normal limits  COMPREHENSIVE METABOLIC PANEL - Abnormal; Notable for the following components:   CO2 19 (*)    Glucose, Bld 101 (*)    Total Protein 6.4 (*)    All other components within normal limits  D-DIMER, QUANTITATIVE (NOT AT [facility])  MAGNESIUM  TROPONIN I  TROPONIN I  TROPONIN I  I-STAT TROPONIN, ED    EKG  EKG Interpretation  Date/Time:  Friday January 31 2018 11:20:08 EST Ventricular Rate:  69 PR Interval:    QRS Duration: 76 QT Interval:  413 QTC Calculation: 443 R Axis:   74 Text Interpretation:  Sinus rhythm Ventricular premature complex Right atrial enlargement Since last EKG, no significant change Confirmed by Duffy Bruce [phone]) on 01/31/2018 1:27:33 PM       Radiology Dg Chest 2 View  Result Date: 01/30/2018 CLINICAL DATA:  Chest pain EXAM: CHEST  2 VIEW COMPARISON:  01/22/2013 chest radiograph. FINDINGS: Stable cardiomediastinal silhouette with normal heart size. No pneumothorax. No pleural effusion. Lungs appear clear, with no acute consolidative airspace disease and no pulmonary edema. IMPRESSION: No active cardiopulmonary disease. Electronically Signed   By: Ilona Sorrel M.D.   On: 01/30/2018 16:41    Procedures Procedures (including critical care time)  Medications Ordered in ED Medications  nicotine (NICODERM CQ - dosed in mg/24 hours) patch 21 mg (21 mg Transdermal Patch Applied 01/31/18 1454)  ipratropium-albuterol (DUONEB) 0.5-2.5 (3) MG/3ML nebulizer solution 3 mL (3 mLs Nebulization Given 01/31/18 1307)  LORazepam (ATIVAN) tablet 1 mg (1 mg Oral Given 01/31/18 1454)     Initial Impression / Assessment and Plan / ED Course  I have reviewed the triage vital signs and the nursing notes.  Pertinent labs & imaging results that  were available during my care of the patient were reviewed by me and considered in my medical decision making (see chart for details).     47 year old female with significant coronary risk factors presenting with recurrent syncopal and near syncopal episodes. She has frequent PVCS and transient trigeminy as well as a component of chest pain. Work up today and yesterday was largely unremarkably, but given her risk factors, admission for serial troponins and observation is warranted.   Initial troponin negative. EKG with PVCs, but unchanged from yesterday. Labs otherwise remain grossly unchanged from yesterday.  Wheezing on initial exam resolved after duonebulizer.  On re-evaluation, chest pain has resolved.   Dr. Philipp Ovens, resident from the internal medicine team has accepted the admission. The patient appears reasonably stabilized for admission considering the current resources, flow, and capabilities available in the ED at this time, and I doubt any other [facility] requiring further screening and/or treatment in the ED prior to admission.   Final Clinical Impressions(s) / ED Diagnoses   Final diagnoses:  Nonspecific chest pain  PVC's (premature ventricular contractions)    ED Discharge Orders    None       Sunset Joshi A, PA-C 01/31/18 1504    Duffy Bruce, MD 01/31/18 [phone]

## 2018-01-31 NOTE — H&P (Signed)
Date: 01/31/2018               Patient Name:  [name] MRN: [id number]  DOB: 1971/09/09 Age / Sex: 47 y.o., female   PCP: Patient, No Pcp Per         Medical Service: Internal Medicine Teaching Service         Attending Physician: Dr. Sid Falcon, MD    First Contact: Dr. Ronalee Red Pager: [phone]  Second Contact: Dr. Philipp Ovens Pager: [phone]       After Hours (After 5p/  First Contact Pager: [phone]  weekends / holidays): Second Contact Pager: [phone]   Chief Complaint: chest pressure and syncope  History of Present Illness:  [name] is a 47yo female with PMH of cavitary lesion of lung, ADHD, anxiety/depression, COPD, hx of staphylococcal septicemia, intraspinal abscess, mediastinal mass, and pulmonary alveolitis who presents with chest pressure and syncope.  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 She endorses substernal chest pressure and palpitations. She was told she lost consciousness. She was caught by her roommate and did not hit her head. She regained consciousness quickly. The chest pressure was not associated with shortness of breath, radiation to neck/arm, or nausea/vomiting. She had a similar episode today at 10:30am while she was sitting in the bed. She states the episodes last approximately 10-15 minutes before resolving on their own. She endorses continued mild pressure in her chest. She states that she has passed out two more times in the past couple weeks.  She has no known arrhythmias or cardiac disease. Father died of MI at age 69. She is unsure whether there is a history of syncope in her family. She denies previous stress test or cath.  She has been experiencing a lot of stress recently. She states that her son and mother both passed away recently in the last couple months and has just recently been getting everything back together. She has 3 other kids who live in [place]. Lives with a  roommate currently.  She does not have a PCP and has not had one in years. She endorses compliance with her medications. Smokes 10-20 cigarettes/day for 20+ years. Drinks alcohol occasionally (1x/week or month). Denies illicit drug use.  ED Course: - BP 115/82, HR 70, RR 12, temp 98.3, O2 98% on RA - CBC and CMP wnl. D-dimer negative. Troponin 0.00. Mg 1.7 - CXR without acute abnormality. EKG with sinus rhythm and occasional PVC, no QTc prolongation, otherwise normal intervals. - Received Ativan x1 in the ED and Duoneb x1  Meds:  Current Meds  Medication Sig  . ALPRAZolam (XANAX) 1 MG tablet Take 1 mg by mouth 3 (three) times daily.  . Ascorbic Acid Buffered (BUFFERED C POWDER PO) Take 1 Package by mouth as needed (pain).  . bismuth subsalicylate (PEPTO BISMOL) 262 MG/15ML suspension Take 30 mLs by mouth every 6 (six) hours as needed for indigestion.  . busPIRone (BUSPAR) 15 MG tablet Take 15 mg by mouth 3 (three) times daily.  . DULoxetine (CYMBALTA) 60 MG capsule Take 60 mg by mouth 2 (two) times daily.  Marland Kitchen gabapentin (NEURONTIN) 800 MG tablet Take 800 mg by mouth.   Allergies: Allergies as of 01/31/2018 - Review Complete 01/31/2018  Allergen Reaction Noted  . Penicillins Other (See Comments) 01/22/2013   Past Medical History:  Diagnosis Date  . ADHD (attention deficit hyperactivity disorder)   . Anxiety   . Depression   .  Hx of staphylococcal septicemia    Family History:  Father: MI at age 35 Hx of heart disease in family  Social History: - She has 3 other kids who live in [place]. Lives with a roommate currently. - She does not have a PCP and has not had one in years - She endorses compliance with her medications - Smokes 10-20 cigarettes/day for 20+ years - Drinks alcohol occasionally (1x/week or month) - Denies illicit or IV drug use.  Review of Systems: A complete ROS was negative except as per HPI.  Physical Exam: Blood pressure 121/83, pulse 86, temperature 98.3 F  (36.8 C), temperature source Oral, resp. rate 17, height 5\' 5"  (1.651 m), weight 130 lb (59 kg), SpO2 98 %. GEN: Well-appearing, well-nourished young female sitting up in bed in NAD. Alert and oriented. HENT: [place]/AT. Moist mucous membranes. No visible lesions. EYES: PERRL. Sclera non-icteric. Conjunctiva clear. RESP: Clear to auscultation bilaterally. No wheezes, rales, or rhonchi. No increased work of breathing. CV: Normal rate and regular rhythm. Occasional PVCs. No murmurs, gallops, or rubs. No carotid bruits. No LE edema. ABD: Soft. Non-tender. Non-distended. Normoactive bowel sounds. EXT: No edema. Warm. 2+ radial and DP pulses. Capillary refill <2sec. NEURO: Cranial nerves II-XII grossly intact. Able to lift all four extremities against gravity. No apparent audiovisual hallucinations. Speech fluent and appropriate. PSYCH: Patient is anxious and pleasant. Well-groomed; speech is appropriate and on-subject.  Labs CBC Latest Ref Rng & Units 01/31/2018 01/30/2018 01/25/2013  WBC 4.0 - 10.5 K/uL 8.4 9.3 6.1  Hemoglobin 12.0 - 15.0 g/dL 15.2(H) 16.8(H) 12.0  Hematocrit 36.0 - 46.0 % 44.1 48.2(H) 35.2(L)  Platelets 150 - 400 K/uL 207 243 409(H)   CMP Latest Ref Rng & Units 01/31/2018 01/30/2018 01/23/2013  Glucose 65 - 99 mg/dL 101(H) 160(H) 114(H)  BUN 6 - 20 mg/dL 6 6 9   Creatinine 0.44 - 1.00 mg/dL 0.65 0.81 0.59  Sodium 135 - 145 mmol/L 138 136 131(L)  Potassium 3.5 - 5.1 mmol/L 4.7 3.8 4.2  Chloride 101 - 111 mmol/L 107 105 98  CO2 22 - 32 mmol/L 19(L) 17(L) 24  Calcium 8.9 - 10.3 mg/dL 9.2 9.3 9.3  Total Protein 6.5 - 8.1 g/dL 6.4(L) - -  Total Bilirubin 0.3 - 1.2 mg/dL 0.7 - -  Alkaline Phos 38 - 126 U/L 68 - -  AST 15 - 41 U/L 24 - -  ALT 14 - 54 U/L 20 - -   Troponin 0.00 D-dimer <0.27 Mg 1.7  EKG: personally reviewed my interpretation is sinus rhythm and occasional PVC, no QTc prolongation, otherwise normal intervals.  CXR: personally reviewed my interpretation is no evidence  of consolidation or effusion. Normal heart size  Assessment & Plan by Problem: Active Problems:   Chest pain  [name] is a 47yo female with PMH of cavitary lesion of lung, ADHD, anxiety/depression, COPD, hx of staphylococcal septicemia, intraspinal abscess, mediastinal mass, and pulmonary alveolitis who presents with 3 episodes of chest pressure and syncope in the last couple weeks. She has prodromal symptoms and each episode lasts ~10-15 minutes. HDS, afebrile, and labs and imaging are reassuring thus far.  Chest pressure Atypical chest pain, although risk factors include smoking history and family history of MI at age 47s. Will continue to monitor and rule out ACS. Initial troponin negative. EKG without ST elevation. D-dimer negative. CXR without acute pathology. No wheezing or infectious s/s to suggest COPD exacerbation or URI. Could be related to anxiety/panic attacks. - Telemetry - Trend  troponins - EKG in AM  Syncope With prodromal symptoms, suggesting etiology of neurogenic vs orthostatic vs substance use-related (although patient denies substance use). No carotid bruits on exam. She does have occasional PVCs on EKG. May have an arrhythmia, although the pre-syncopal symptoms point away from this. - Telemetry - UDS - Orthostatics - Echo - TSH  Anxiety/depression Home regimen includes buspirone 15mg  TID, duloxetine 60mg  BID, and xanax 1mg  TID (which she takes PRN, and is taking ~every other day). She has not had Adderrall since August. She reports compliance with her other medications. Was previously seeing [facility], as recently as 03/2017. - Continue home buspirone 15mg  TID - Continue home duloxetine 60mg  BID - Hold home xanax  Tobacco use disorder Smokes 10-20 cigarettes daily - Nicotine patch 21mg  q24h  Diet: Regular VTE PPx: Lovenox Code status: Full code Dispo: Admit patient to Observation with expected length of stay less than 2 midnights.  Signed: Colbert Ewing, MD 01/31/2018, 3:30 PM  Pager: Mamie Nick [phone]

## 2018-02-01 ENCOUNTER — Observation Stay (HOSPITAL_BASED_OUTPATIENT_CLINIC_OR_DEPARTMENT_OTHER): Payer: Medicare Other

## 2018-02-01 DIAGNOSIS — Z880 Allergy status to penicillin: Secondary | ICD-10-CM | POA: Diagnosis not present

## 2018-02-01 DIAGNOSIS — F909 Attention-deficit hyperactivity disorder, unspecified type: Secondary | ICD-10-CM

## 2018-02-01 DIAGNOSIS — R55 Syncope and collapse: Secondary | ICD-10-CM | POA: Diagnosis not present

## 2018-02-01 DIAGNOSIS — Z8619 Personal history of other infectious and parasitic diseases: Secondary | ICD-10-CM

## 2018-02-01 DIAGNOSIS — F339 Major depressive disorder, recurrent, unspecified: Secondary | ICD-10-CM | POA: Diagnosis not present

## 2018-02-01 DIAGNOSIS — J449 Chronic obstructive pulmonary disease, unspecified: Secondary | ICD-10-CM

## 2018-02-01 DIAGNOSIS — R0789 Other chest pain: Secondary | ICD-10-CM | POA: Diagnosis not present

## 2018-02-01 DIAGNOSIS — F419 Anxiety disorder, unspecified: Secondary | ICD-10-CM

## 2018-02-01 DIAGNOSIS — R079 Chest pain, unspecified: Secondary | ICD-10-CM

## 2018-02-01 DIAGNOSIS — I493 Ventricular premature depolarization: Secondary | ICD-10-CM

## 2018-02-01 DIAGNOSIS — Z8249 Family history of ischemic heart disease and other diseases of the circulatory system: Secondary | ICD-10-CM | POA: Diagnosis not present

## 2018-02-01 DIAGNOSIS — Z8709 Personal history of other diseases of the respiratory system: Secondary | ICD-10-CM | POA: Diagnosis not present

## 2018-02-01 DIAGNOSIS — F17210 Nicotine dependence, cigarettes, uncomplicated: Secondary | ICD-10-CM

## 2018-02-01 DIAGNOSIS — Z79899 Other long term (current) drug therapy: Secondary | ICD-10-CM

## 2018-02-01 LAB — HIV ANTIBODY (ROUTINE TESTING W REFLEX): HIV Screen 4th Generation wRfx: NONREACTIVE

## 2018-02-01 LAB — ECHOCARDIOGRAM COMPLETE
Height: 65 in
Weight: 2200 oz

## 2018-02-01 LAB — TROPONIN I

## 2018-02-01 NOTE — Progress Notes (Signed)
   Subjective: Resting comfortably this morning. No further episodes of chest pain or syncope.   Objective:  Vital signs in last 24 hours: Vitals:   01/31/18 1530 01/31/18 1603 01/31/18 2123 02/01/18 0508  BP:  113/86  118/81  Pulse: 65  87 94  Resp: (!) 21  18   Temp:   98.7 F (37.1 C) 98.3 F (36.8 C)  TempSrc:   Oral Oral  SpO2: 100%  99% 94%  Weight:    137 lb 8 oz (62.4 kg)  Height:       Physical Exam Constitutional: NAD, appears comfortable Cardiovascular: RRR, no murmurs, rubs, or gallops.  Pulmonary/Chest: CTAB, mild inspiratory and expiratory wheezing, no rhonchi  Abdominal: Soft, non tender, non distended. +BS.  Extremities: Warm and well perfused.  No edema.  Neurological: A&Ox3, CN II - XII grossly intact.  Psychiatric: Normal mood and affect   Assessment/Plan:  [name] is a 47yo female with PMH of cavitary lesion of lung, ADHD, anxiety/depression, COPD, hx of staphylococcal septicemia, intraspinal abscess, mediastinal mass, and pulmonary alveolitis who presents with 3 episodes of chest pressure and syncope in the last couple weeks. She has prodromal symptoms and each episode lasts ~10-15 minutes. HDS, afebrile, and labs and imaging are reassuring thus far.  Syncope With prodromal of shaking, chest pain, diaphoresis, and generalized weakness lasting 10-15 minutes. Work up thus far is reassuring. Patient has been on telemetry without capture of arrhythmia. Orthostatics vitals yesterday were negative.  Doubt cardiogenic syncope, echocardiogram is still pending. Possibly vasovagal or panic disorder give her significant anxiety history. -- Telemetry -- Ambulate patient  -- Will attempt to obtain echocardiogram today, otherwise discharge with plans for outpatient echo   Chest pressure Now resolved. Atypical in nature, accompanied by symptoms of shaking, diaphoresis, generalized weakness and syncope, although chest pain persisted after regaining consciousness. ACS  risk factors include smoking and +family history of MI at age 83s. ACS has been adequately ruled out with serial troponins and EKGs. She does have mild wheezing on exam this morning with no known history of asthma or COPD. Given her smoking history, would consider outpatient PFTs for further work up. Other possibly etiology is panic disorder. She does have significant anxiety history and has not follow up with psychiatry in 8 months.  -- Outpatient PFTs for wheezing  -- Psychiatry follow up  Anxiety/depression Home regimen includes buspirone 15mg  TID, duloxetine 60mg  BID, and xanax 1mg  TID (which she takes PRN, and is taking ~every other day). She has not had Adderrall since August. She reports compliance with her other medications. Was previously seeing Psychiatry at [facility], as recently as 03/2017. - Continue home buspirone 15mg  TID - Continue home duloxetine 60mg  BID - Hold home xanax  Tobacco use disorder Smokes 10-20 cigarettes daily - Nicotine patch 21mg  q24h  Diet: Regular VTE PPx: Lovenox Code status: Full code Dispo: Admit patient to Observation with expected length of stay less than 2 midnights.   Dispo: Anticipated discharge today pending echocardiogram.   [name], [name] 02/01/2018, 9:27 AM Pager: [phone]

## 2018-02-01 NOTE — Discharge Summary (Signed)
Name: [name] MRN: [id number] DOB: 25-Jul-1971 47 y.o. PCP: Patient, No Pcp Per  Date of Admission: 01/31/2018 11:15 AM Date of Discharge: 02/01/2018 Attending Physician: Sid Falcon, MD  Discharge Diagnosis: 1. Chest pain 2. Syncope  Active Problems:   Chest pain   PVC's (premature ventricular contractions)   Syncope   Discharge Medications: Allergies as of 02/01/2018      Reactions   Penicillins Other (See Comments)   Childhood reaction      Medication List    TAKE these medications   ALPRAZolam 1 MG tablet Commonly known as:  XANAX Take 1 mg by mouth 3 (three) times daily.   bismuth subsalicylate 626 RS/85IO suspension Commonly known as:  PEPTO BISMOL Take 30 mLs by mouth every 6 (six) hours as needed for indigestion.   BUFFERED C POWDER PO Take 1 Package by mouth as needed (pain).   busPIRone 15 MG tablet Commonly known as:  BUSPAR Take 15 mg by mouth 3 (three) times daily.   DULoxetine 60 MG capsule Commonly known as:  CYMBALTA Take 60 mg by mouth 2 (two) times daily.   gabapentin 800 MG tablet Commonly known as:  NEURONTIN Take 800 mg by mouth.   lidocaine 5 % Commonly known as:  LIDODERM Place 1 patch onto the skin daily. Remove & Discard patch within 12 hours or as directed by MD       Disposition and follow-up:   [name] was discharged from [facility] in Stable condition.  At the hospital follow up visit please address:  1.  Patient set up for 1-time hospital follow-up visit in IMTS clinic. TTE 2/16 returned with hypokinesis of mid-apical inferior myocardium. Called patient on 2/18 to let her know of TTE results and to ask her to come in to the clinic. She was agreeable to 2/20 appointment. She also stated that she had a Cardiology f/u appointment on 2/20. Please assist with setting her up for stress test, if not already done.  2.  Has she had further episodes of chest pain? ACS and PE ruled out. Please  consider further evaluation with outpatient PFTs vs psychiatric work-up.  3.  Has she had any more episodes of syncope? Please complete outpatient echocardiogram and ensure she has Psychiatry follow-up.  4.  Labs / imaging needed at time of follow-up: Stress test, Consider PFTs  5.  Pending labs/ test needing follow-up: None  Follow-up Appointments: Follow-up Information    [place]. Schedule an appointment as soon as possible for a visit in 2 week(s).   Contact information: [place] [id number] [place] [facility] Course by problem list: Active Problems:   Chest pain   PVC's (premature ventricular contractions)   Syncope   1. Chest pain Atypical in nature, accompanies by symptoms of shaking, diaphoresis, generalized weakness, and syncope, although chest pressure persisted after regaining consciousness. ACS risk factors include tobacco use and +family history of MI at age 41. ACS ruled out with negative serial troponins and EKGs. D-Dimer negative. Telemetry showed occasional PVCs, but no other arrhythmias. She had mild wheezing on exam without known history of COPD or asthma. Given her smoking history, she may benefit from outpatient PFTs for further evaluation. Alternate etiology is panic/anxiety disorder. She does have a significant psychiatric history and has not seen Behavioral Health in 8 months. Chest pain resolved the day after admission,  and she was encouraged to f/u with an outpatient PCP to establish care and to provide further work-up for causes of chest pain.  2. Syncope   With prodromal symptoms of diaphoresis, chest pressure, and generalized weakness lasting 10-15 minutes. Telemetry without signs of arrhythmia. Orthostatics negative. TSH normal. Etiology is unlikely to be cardiogenic syncope. Echocardiogram was completed while inpatient, read was pending on discharge. She will be contacted with  the results. She was also ambulated prior to discharge. Possibly vasovagal vs panic/anxiety disorder given her significant anxiety history. Please ensure she has Psychiatry follow-up.  3. Anxiety/depression Home regimen includes cymbalta and buspirone. Patient states she takes xanax as needed every other day. She reported that she had last taken Xanax the day prior to admission. Xanax was last filled 07/2017. UDS was positive for THC, but no benzos. She reports that she was previously following with [facility]iatry at [facility] but has not been back there in ~8 months. Please ensure she has follow up with Psychiatry.  Discharge Vitals:   BP 118/81 (BP Location: Left Arm)   Pulse 94   Temp 98.3 F (36.8 C) (Oral)   Resp 18   Ht 5\' 5"  (1.651 m)   Wt 137 lb 8 oz (62.4 kg)   SpO2 94%   BMI 22.88 kg/m   Pertinent Labs, Studies, and Procedures:  CBC Latest Ref Rng & Units 01/31/2018 01/30/2018 01/25/2013  WBC 4.0 - 10.5 K/uL 8.4 9.3 6.1  Hemoglobin 12.0 - 15.0 g/dL 15.2(H) 16.8(H) 12.0  Hematocrit 36.0 - 46.0 % 44.1 48.2(H) 35.2(L)  Platelets 150 - 400 K/uL 207 243 409(H)   CMP Latest Ref Rng & Units 01/31/2018 01/30/2018 01/23/2013  Glucose 65 - 99 mg/dL 101(H) 160(H) 114(H)  BUN 6 - 20 mg/dL 6 6 9   Creatinine 0.44 - 1.00 mg/dL 0.65 0.81 0.59  Sodium 135 - 145 mmol/L 138 136 131(L)  Potassium 3.5 - 5.1 mmol/L 4.7 3.8 4.2  Chloride 101 - 111 mmol/L 107 105 98  CO2 22 - 32 mmol/L 19(L) 17(L) 24  Calcium 8.9 - 10.3 mg/dL 9.2 9.3 9.3  Total Protein 6.5 - 8.1 g/dL 6.4(L) - -  Total Bilirubin 0.3 - 1.2 mg/dL 0.7 - -  Alkaline Phos 38 - 126 U/L 68 - -  AST 15 - 41 U/L 24 - -  ALT 14 - 54 U/L 20 - -   UDS positive for THC TSH 1.104 Mg 1.7 Troponin negative x3 HIV nonreactive D-dimer <0.27  CXR 01/30/2018 No active cardiopulmonary disease.  TTE 02/01/2018 - Left ventricle: The cavity size was normal. Systolic function was   normal. The estimated ejection fraction was in the range of  55%   to 60%. There is hypokinesis of the mid-apicalinferior   myocardium. Left ventricular diastolic function parameters were   normal. - Aortic valve: There was trivial regurgitation. - Mitral valve: There was trivial regurgitation. - Pulmonary arteries: PA peak pressure: 31 mm Hg (S).  Discharge Instructions: Discharge Instructions    Call MD for:  difficulty breathing, headache or visual disturbances   Complete by:  As directed    Call MD for:  extreme fatigue   Complete by:  As directed    Call MD for:  persistant dizziness or light-headedness   Complete by:  As directed    Call MD for:  temperature >100.4   Complete by:  As directed    Diet - low sodium heart healthy   Complete by:  As directed  Discharge instructions   Complete by:  As directed    [name],  You were admitted to the hospital for chest pain and episodes of passing out.  For your chest pain, we have ruled out the immediate, life-threatening causes at this time. You can complete the rest of your work-up as an outpatient. - Please make sure you find a primary care doctor and make an appointment with them so that they can follow your care as an outpatient. One option is [employer], however there are many great options in the area that will accept Medicare. - I think it will also be very important for you to follow up with Psychiatry/Behavioral Health. You can find a provider in the area or return to [facility], where you were seen in the past. - We will call you with the results of the echocardiogram.  For your episodes of passing out, it is unlikely to be a problem with your heart causing these episodes. Please follow up with a primary care doctor and psychiatrist who can help determine what the cause of these episodes is.   Increase activity slowly   Complete by:  As directed       Signed: Colbert Ewing, MD 02/01/2018, 12:16 PM   Pager: Mamie Nick [phone]

## 2018-02-01 NOTE — Progress Notes (Signed)
  Date: 02/01/2018  Patient name: [name]  Medical record number: [id number]  Date of birth: September 18, 1971   I have seen and evaluated [name] and discussed their care with the Residency Team. Briefly, [name] is a 89XY woman with complicated PMH who presented with syncope and chest pain.  She had a syncopal episode while working with prodrome and then a similar episode while talking on the phone in bed.  She lost consciousness with the first and not the second.  She noted chest pressure which was associated, but without SOB, radiation or diaphoresis.  She also reports passing out two other times in the preceding couple of weeks.  She has no personal cardiac disease.  Father passed at 17 from an MI.  She does not know of any syncope in her family.  She is a current smoker.  She has anxiety disorder for which she hasn't been treated recently.  In the ED, troponin were negative, EKG showed PVCs, D dimer was negative.    Vitals:   01/31/18 2123 02/01/18 0508  BP:  118/81  Pulse: 87 94  Resp: 18   Temp: 98.7 F (37.1 C) 98.3 F (36.8 C)  SpO2: 99% 94%   Physical Exam:  Gen: Young woman in NAD, lying in bed Eyes: Anicteric sclerae, no conjunctival infection  HENT: MMM, neck supple CV: RR, NR, no murmur Pulm: CTAB, no wheezing Abd: Soft, NT Ext: Thin, no edema Skin: No rash, multiple tattoos Psych: Somewhat anxious appearing.   Data:  Troponin X 4 negative  EKG NSR with very mildly prolonged QTc TSH 1.104  Assessment and Plan: I have seen and evaluated the patient as outlined above. I agree with the formulated Assessment and Plan as detailed in the residents' note, with the following changes:   1. Chest pressure - ACS is ruled out, unlikely to be a cardiac process - DDx includes GERD, anxiety and many others - Continue telemetry for syncope work up.   2. Syncope - clinically most consistent with vasovagal, but given history of multiple episodes would get TTE to rule  out structural disease.  - Check Orthostatics - TSH normal  Other issues per Dr. Rivka Safer daily note.   Sid Falcon, MD 2/16/20196:52 AM

## 2018-02-01 NOTE — Progress Notes (Signed)
  Echocardiogram 2D Echocardiogram has been performed.  [name] [name] 02/01/2018, 12:22 PM

## 2018-02-01 NOTE — Progress Notes (Signed)
Pt got dizzy after walking in the hallway. Denies CP, SOB ar palpitation. BP is 138/87 P: 73 . MD niotified.  Ferdinand Lango, RN

## 2018-02-03 ENCOUNTER — Telehealth: Payer: Self-pay

## 2018-02-03 NOTE — Telephone Encounter (Signed)
Hospital TOC per Dr Ronalee Red, discharge 02/01/2018, appt 01/2018.

## 2018-02-04 ENCOUNTER — Telehealth: Payer: Self-pay | Admitting: *Deleted

## 2018-02-04 NOTE — Telephone Encounter (Signed)
Transition Care Management Follow-up Telephone Call   Date discharged? 01/31/18   How have you been since you were released from the hospital? "I'm feeling fine"   Do you understand why you were in the hospital? " now I kinda know more on what happened"   Do you understand the discharge instructions? yes   Where were you discharged to? home   Items Reviewed:  Medications reviewed: yes  Allergies reviewed: yes  Dietary changes reviewed: yes  Referrals reviewed: yes, aware of cardiology appt   Functional Questionnaire:   Activities of Daily Living (ADLs):   She states they are independent in the following: all adls States they require assistance with the following: none   Any transportation issues/concerns?: none   Any patient concerns? none   Confirmed importance and date/time of follow-up visits scheduled yes  Provider Appointment booked with [facility] 02/05/18 @ 2:45 pm  Confirmed with patient if condition begins to worsen call PCP or go to the ER.  Patient was given the office number and encouraged to call back with question or concerns.  : Yes

## 2018-02-05 ENCOUNTER — Ambulatory Visit (INDEPENDENT_AMBULATORY_CARE_PROVIDER_SITE_OTHER): Payer: Medicare Other | Admitting: Cardiology

## 2018-02-05 ENCOUNTER — Encounter: Payer: Self-pay | Admitting: Cardiology

## 2018-02-05 ENCOUNTER — Encounter: Payer: Self-pay | Admitting: Internal Medicine

## 2018-02-05 ENCOUNTER — Other Ambulatory Visit: Payer: Self-pay

## 2018-02-05 ENCOUNTER — Ambulatory Visit (INDEPENDENT_AMBULATORY_CARE_PROVIDER_SITE_OTHER): Payer: Medicare Other | Admitting: Internal Medicine

## 2018-02-05 ENCOUNTER — Encounter (INDEPENDENT_AMBULATORY_CARE_PROVIDER_SITE_OTHER): Payer: Self-pay

## 2018-02-05 VITALS — BP 132/74 | HR 71 | Temp 97.9°F | Ht 65.0 in | Wt 134.2 lb

## 2018-02-05 VITALS — BP 124/80 | HR 78 | Ht 65.0 in | Wt 133.0 lb

## 2018-02-05 DIAGNOSIS — R55 Syncope and collapse: Secondary | ICD-10-CM

## 2018-02-05 DIAGNOSIS — F339 Major depressive disorder, recurrent, unspecified: Secondary | ICD-10-CM | POA: Diagnosis not present

## 2018-02-05 DIAGNOSIS — F411 Generalized anxiety disorder: Secondary | ICD-10-CM | POA: Diagnosis not present

## 2018-02-05 DIAGNOSIS — Z79899 Other long term (current) drug therapy: Secondary | ICD-10-CM

## 2018-02-05 DIAGNOSIS — F17210 Nicotine dependence, cigarettes, uncomplicated: Secondary | ICD-10-CM | POA: Diagnosis not present

## 2018-02-05 DIAGNOSIS — R0789 Other chest pain: Secondary | ICD-10-CM

## 2018-02-05 DIAGNOSIS — R002 Palpitations: Secondary | ICD-10-CM

## 2018-02-05 DIAGNOSIS — R079 Chest pain, unspecified: Secondary | ICD-10-CM | POA: Diagnosis not present

## 2018-02-05 DIAGNOSIS — Z8679 Personal history of other diseases of the circulatory system: Secondary | ICD-10-CM

## 2018-02-05 DIAGNOSIS — Z09 Encounter for follow-up examination after completed treatment for conditions other than malignant neoplasm: Secondary | ICD-10-CM | POA: Diagnosis present

## 2018-02-05 NOTE — Patient Instructions (Signed)
Ms. Brilliant it was nice meeting you today.  -Continue to go to your appointments with cardiology  -Please call and make an appointment with your psychiatrist

## 2018-02-05 NOTE — Patient Instructions (Signed)
Medication Instructions:  Your physician recommends that you continue on your current medications as directed. Please refer to the Current Medication list given to you today.  Labwork: None  Testing/Procedures: Your physician has requested that you have a stress echocardiogram. For further information please visit [email or web address]. Please follow instruction sheet as given.  Your physician has recommended that you wear a holter monitor. Holter monitors are medical devices that record the heart's electrical activity. Doctors most often use these monitors to diagnose arrhythmias. Arrhythmias are problems with the speed or rhythm of the heartbeat. The monitor is a small, portable device. You can wear one while you do your normal daily activities. This is usually used to diagnose what is causing palpitations/syncope (passing out). 48 hours.  Follow-Up: Your physician recommends that you schedule a follow-up appointment in: 1 month.  Any Other Special Instructions Will Be Listed Below (If Applicable).     If you need a refill on your cardiac medications before your next appointment, please call your pharmacy.

## 2018-02-05 NOTE — Progress Notes (Signed)
Cardiology Office Note:    Date:  02/05/2018   ID:  [name], DOB January 12, 1971, MRN [id number]  PCP:  Patient, No Pcp Per  Cardiologist:  Jenean Lindau, MD   Referring MD: No ref. provider found    ASSESSMENT:    1. Chest pain, unspecified type   2. Syncope, unspecified syncope type   3. Palpitations    PLAN:    In order of problems listed above:  1. I reassured the patient about my findings today. 2. I spent 5 minutes with the patient discussing solely about smoking. Smoking cessation was counseled. I suggested to the patient also different medications and pharmacological interventions. Patient is keen to try stopping on its own at this time. He will get back to me if he needs any further assistance in this matter. 3. In view of her palpitations she will undergo 48-hour Holter monitor.  Her TSH has been unremarkable. 4. She will undergo stress echo for chest pain symptoms.  She has multiple risk factors for coronary artery disease cigarette smoking and strong family history of coronary artery disease. 5. Importance of regular exercise stressed and I told her she will need to get this done and put a exercise protocol in place once her testing is done and she is agreeable. 6. She will be seen in follow-up appointment in a month or earlier if she has any concerns.  She knows to go to the nearest emergency room for any concerning symptoms. 7. She was advised not to drive in view of her syncope but if she is cleared by her primary care provider.  She vocalized understanding.   Medication Adjustments/Labs and Tests Ordered: Current medicines are reviewed at length with the patient today.  Concerns regarding medicines are outlined above.  Orders Placed This Encounter  Procedures  . HOLTER MONITOR - 48 HOUR  . ECHOCARDIOGRAM STRESS TEST   No orders of the defined types were placed in this encounter.    History of Present Illness:    [name] is a 47 y.o. female who is  being seen today for the evaluation  After hospitalization.  Patient is a pleasant 47 year old female.  She is accompanied by her roommate.  She mentions to me that she passed out and subsequently went home.  She had similar symptoms the next day for the symptoms she was admitted.  She has had only one episode of syncope.  She is an active smoker smokes more than a pack a day on a regular basis.  She occasionally has chest pain.  This happens all the time no orthopnea or PND.  She leads a very sedentary lifestyle.  Her chest pain does not radiate to any part of the body and is sharp in nature.  At the time of my evaluation, the patient is alert awake oriented and in no distress. Past Medical History:  Diagnosis Date  . ADHD (attention deficit hyperactivity disorder)   . Anxiety   . Depression   . Hx of staphylococcal septicemia     Past Surgical History:  Procedure Laterality Date  . ABDOMINAL HYSTERECTOMY    . BACK SURGERY    . left elbow surgery    . VIDEO BRONCHOSCOPY N/A 01/27/2013   Procedure: VIDEO BRONCHOSCOPY WITH FLUORO;  Surgeon: Elsie Stain, MD;  Location: [facility];  Service: Cardiopulmonary;  Laterality: N/A;    Current Medications: Current Meds  Medication Sig  . ALPRAZolam (XANAX) 1 MG tablet Take 1 mg by  mouth 3 (three) times daily.  . Ascorbic Acid Buffered (BUFFERED C POWDER PO) Take 1 Package by mouth as needed (pain).  . bismuth subsalicylate (PEPTO BISMOL) 262 MG/15ML suspension Take 30 mLs by mouth every 6 (six) hours as needed for indigestion.  . busPIRone (BUSPAR) 15 MG tablet Take 15 mg by mouth 3 (three) times daily.  . DULoxetine (CYMBALTA) 60 MG capsule Take 60 mg by mouth 2 (two) times daily.  Marland Kitchen gabapentin (NEURONTIN) 800 MG tablet Take 800 mg by mouth.     Allergies:   Penicillins   Social History   Socioeconomic History  . Marital status: Married    Spouse name: None  . Number of children: None  . Years of education: None  . Highest  education level: None  Social Needs  . Financial resource strain: None  . Food insecurity - worry: None  . Food insecurity - inability: None  . Transportation needs - medical: None  . Transportation needs - non-medical: None  Occupational History  . None  Tobacco Use  . Smoking status: Current Some Day Smoker    Packs/day: 1.00    Years: 20.00    Pack years: 20.00    Types: Cigarettes  . Smokeless tobacco: Never Used  Substance and Sexual Activity  . Alcohol use: No  . Drug use: No  . Sexual activity: None  Other Topics Concern  . None  Social History Narrative  . None     Family History: The patient's family history is not on file.  ROS:   Please see the history of present illness.    All other systems reviewed and are negative.  EKGs/Labs/Other Studies Reviewed:    The following studies were reviewed today: EKG was unremarkable and revealed sinus rhythm and nonspecific ST-T changes chest x-ray and other hospital records were also reviewed by me.   Recent Labs: 01/31/2018: ALT 20; BUN 6; Creatinine, Ser 0.65; Hemoglobin 15.2; Magnesium 1.7; Platelets 207; Potassium 4.7; Sodium 138; TSH 1.104  Recent Lipid Panel No results found for: CHOL, TRIG, HDL, CHOLHDL, VLDL, LDLCALC, LDLDIRECT  Physical Exam:    VS:  BP 124/80 (BP Location: Right Arm, Patient Position: Sitting, Cuff Size: Normal)   Pulse 78   Ht 5\' 5"  (1.651 m)   Wt 133 lb (60.3 kg)   SpO2 99%   BMI 22.13 kg/m     Wt Readings from Last 3 Encounters:  02/05/18 133 lb (60.3 kg)  02/01/18 137 lb 8 oz (62.4 kg)  01/30/18 130 lb (59 kg)     GEN: Patient is in no acute distress HEENT: Normal NECK: No JVD; No carotid bruits LYMPHATICS: No lymphadenopathy CARDIAC: S1 S2 regular, 2/6 systolic murmur at the apex. RESPIRATORY:  Clear to auscultation without rales, wheezing or rhonchi  ABDOMEN: Soft, non-tender, non-distended MUSCULOSKELETAL:  No edema; No deformity  SKIN: Warm and dry NEUROLOGIC:  Alert  and oriented x 3 PSYCHIATRIC:  Normal affect    Signed, Jenean Lindau, MD  02/05/2018 9:45 AM    [place]

## 2018-02-06 NOTE — Assessment & Plan Note (Signed)
Patient was recently admitted for atypical chest pain.  Echo during the hospitalization revealed hypokinesis of the mid apical inferior myocardium.  She was seen by cardiology on February 05, 2018 and they are planning to do a stress echo for further workup.  Patient denies having any more episodes of chest pain since her hospital discharge.  Does report having occasional wheezing, shortness of breath, and coughing but no symptoms since her hospital discharge.  Plan -Encouraged her to go for her stress echo -Would like to recommend her new primary care office to consider doing PFTs as she does have a history of smoking cigarettes (20+ pack years).

## 2018-02-06 NOTE — Progress Notes (Signed)
   CC: Hospital follow-up of chest pain, syncope, and anxiety.  HPI:  Ms.[name] is a 47 y.o. female with a past medical history of conditions listed below presenting to the clinic for a hospital follow-up of chest pain, syncope, and anxiety.  This is a one-time visit to the clinic as patient has established care with [facility] and plans to see them on Friday, February 07, 2018. Please see problem based charting for the status of the patient's current and chronic medical conditions.   Past Medical History:  Diagnosis Date  . ADHD (attention deficit hyperactivity disorder)   . Anxiety   . Depression   . Hx of staphylococcal septicemia    Review of Systems: Pertinent positives mentioned in HPI. Remainder of all ROS negative.   Physical Exam:  Vitals:   02/05/18 1506  BP: 132/74  Pulse: 71  Temp: 97.9 F (36.6 C)  TempSrc: Oral  SpO2: 100%  Weight: 134 lb 3.2 oz (60.9 kg)  Height: 5\' 5"  (1.651 m)   Physical Exam  Constitutional: She is oriented to person, place, and time. She appears well-developed and well-nourished. No distress.  HENT:  Head: Normocephalic and atraumatic.  Mouth/Throat: Oropharynx is clear and moist.  Eyes: Right eye exhibits no discharge. Left eye exhibits no discharge.  Cardiovascular: Normal rate, regular rhythm and intact distal pulses.  Pulmonary/Chest: Effort normal and breath sounds normal. No respiratory distress. She has no wheezes. She has no rales.  Abdominal: Soft. Bowel sounds are normal. She exhibits no distension. There is no tenderness.  Musculoskeletal: She exhibits no edema.  Neurological: She is alert and oriented to person, place, and time.  Skin: Skin is warm and dry.    Assessment & Plan:   See Encounters Tab for problem based charting.  Patient discussed with Dr. Evette Doffing

## 2018-02-06 NOTE — Assessment & Plan Note (Addendum)
Patient had a syncopal episode prior to her recent hospitalization.  Telemetry did not show signs of arrhythmia.  Orthostatics were negative.  TSH was normal.  She was seen by cardiology in February 05, 2018 and planning to set patient up for a 48-hour Holter monitor.  She denies having any further episodes of syncope since her hospital discharge.  Plan -Encouraged her to keep going for her cardiology appointments

## 2018-02-06 NOTE — Assessment & Plan Note (Signed)
Patient has a history of anxiety and depression.  She is currently on Cymbalta and buspirone.  Per hospital discharge summary, patient reported taking Xanax the day prior to admission but her UDS was not positive for benzos.  Instead, it was positive for THC.  Patient states she was previously being followed by regional psychiatry in [facility] but has not been back for several years.  Plan -Continue Cymbalta and buspirone -Would like to request her new primary care office to ensure patient has psychiatry follow-up.

## 2018-02-11 NOTE — Progress Notes (Signed)
Internal Medicine Clinic Attending  Case discussed with Dr. Rathoreat the time of the visit. We reviewed the resident's history and exam and pertinent patient test results. I agree with the assessment, diagnosis, and plan of care documented in the resident's note.  

## 2018-03-05 ENCOUNTER — Ambulatory Visit (HOSPITAL_BASED_OUTPATIENT_CLINIC_OR_DEPARTMENT_OTHER): Payer: Medicare Other | Attending: Cardiology

## 2019-06-17 ENCOUNTER — Other Ambulatory Visit: Payer: Self-pay

## 2019-06-17 ENCOUNTER — Emergency Department (HOSPITAL_COMMUNITY)
Admission: EM | Admit: 2019-06-17 | Discharge: 2019-06-18 | Disposition: A | Discharge status: Other Acute Inpatient Hospital | Payer: Medicare Other | Attending: Emergency Medicine | Admitting: Emergency Medicine

## 2019-06-17 ENCOUNTER — Encounter (HOSPITAL_COMMUNITY): Payer: Self-pay | Admitting: Emergency Medicine

## 2019-06-17 DIAGNOSIS — F3110 Bipolar disorder, current episode manic without psychotic features, unspecified: Secondary | ICD-10-CM | POA: Diagnosis not present

## 2019-06-17 DIAGNOSIS — F1520 Other stimulant dependence, uncomplicated: Secondary | ICD-10-CM | POA: Insufficient documentation

## 2019-06-17 DIAGNOSIS — Z79899 Other long term (current) drug therapy: Secondary | ICD-10-CM | POA: Insufficient documentation

## 2019-06-17 DIAGNOSIS — Z046 Encounter for general psychiatric examination, requested by authority: Secondary | ICD-10-CM | POA: Insufficient documentation

## 2019-06-17 DIAGNOSIS — F1320 Sedative, hypnotic or anxiolytic dependence, uncomplicated: Secondary | ICD-10-CM | POA: Diagnosis not present

## 2019-06-17 DIAGNOSIS — R462 Strange and inexplicable behavior: Secondary | ICD-10-CM | POA: Diagnosis present

## 2019-06-17 DIAGNOSIS — Z9114 Patient's other noncompliance with medication regimen: Secondary | ICD-10-CM | POA: Diagnosis not present

## 2019-06-17 DIAGNOSIS — F17210 Nicotine dependence, cigarettes, uncomplicated: Secondary | ICD-10-CM | POA: Diagnosis not present

## 2019-06-17 DIAGNOSIS — F1220 Cannabis dependence, uncomplicated: Secondary | ICD-10-CM | POA: Insufficient documentation

## 2019-06-17 DIAGNOSIS — F309 Manic episode, unspecified: Secondary | ICD-10-CM

## 2019-06-17 LAB — COMPREHENSIVE METABOLIC PANEL
ALT: 12 U/L (ref 0–44)
AST: 22 U/L (ref 15–41)
Albumin: 4.6 g/dL (ref 3.5–5.0)
Alkaline Phosphatase: 90 U/L (ref 38–126)
Anion gap: 10 (ref 5–15)
BUN: 6 mg/dL (ref 6–20)
CO2: 23 mmol/L (ref 22–32)
Calcium: 9.7 mg/dL (ref 8.9–10.3)
Chloride: 103 mmol/L (ref 98–111)
Creatinine, Ser: 0.71 mg/dL (ref 0.44–1.00)
GFR calc Af Amer: >60 mL/min (ref >60)
GFR calc non Af Amer: >60 mL/min (ref >60)
Glucose, Bld: 112 mg/dL — ABNORMAL HIGH (ref 70–99)
Potassium: 3.4 mmol/L — ABNORMAL LOW (ref 3.5–5.1)
Sodium: 136 mmol/L (ref 135–145)
Total Bilirubin: 0.8 mg/dL (ref 0.3–1.2)
Total Protein: 8.3 g/dL — ABNORMAL HIGH (ref 6.5–8.1)

## 2019-06-17 LAB — RAPID URINE DRUG SCREEN, HOSP PERFORMED
Amphetamines: POSITIVE — AB
Barbiturates: NOT DETECTED
Benzodiazepines: POSITIVE — AB
Cocaine: NOT DETECTED
Opiates: NOT DETECTED
Tetrahydrocannabinol: POSITIVE — AB

## 2019-06-17 LAB — CBC
HCT: 45.2 % (ref 36.0–46.0)
Hemoglobin: 15.6 g/dL — ABNORMAL HIGH (ref 12.0–15.0)
MCH: 32.4 pg (ref 26.0–34.0)
MCHC: 34.5 g/dL (ref 30.0–36.0)
MCV: 94 fL (ref 80.0–100.0)
Platelets: 252 10*3/uL (ref 150–400)
RBC: 4.81 MIL/uL (ref 3.87–5.11)
RDW: 12.2 % (ref 11.5–15.5)
WBC: 11.6 10*3/uL — ABNORMAL HIGH (ref 4.0–10.5)
nRBC: 0 % (ref 0.0–0.2)

## 2019-06-17 LAB — ETHANOL: Alcohol, Ethyl (B): <10 mg/dL (ref <10)

## 2019-06-17 LAB — ACETAMINOPHEN LEVEL: Acetaminophen (Tylenol), Serum: <10 ug/mL — ABNORMAL LOW (ref 10–30)

## 2019-06-17 LAB — SALICYLATE LEVEL: Salicylate Lvl: 11.1 mg/dL (ref 2.8–30.0)

## 2019-06-17 MED ORDER — OLANZAPINE 5 MG PO TBDP
5.0000 mg | ORAL_TABLET | Freq: Three times a day (TID) | ORAL | Status: DC | PRN
  Administered 2019-06-17: 5 mg via ORAL
  Filled 2019-06-17: qty 1

## 2019-06-17 MED ORDER — ZOLPIDEM TARTRATE 5 MG PO TABS
5.0000 mg | ORAL_TABLET | Freq: Every evening | ORAL | Status: DC | PRN
  Administered 2019-06-17: 5 mg via ORAL
  Filled 2019-06-17: qty 1

## 2019-06-17 MED ORDER — LORAZEPAM 1 MG PO TABS: 1.0000 mg | ORAL_TABLET | ORAL | Status: DC | PRN

## 2019-06-17 MED ORDER — ONDANSETRON HCL 4 MG PO TABS: 4.0000 mg | ORAL_TABLET | Freq: Three times a day (TID) | ORAL | Status: DC | PRN

## 2019-06-17 MED ORDER — ZIPRASIDONE MESYLATE 20 MG IM SOLR: 20.0000 mg | INTRAMUSCULAR | Status: DC | PRN

## 2019-06-17 MED ORDER — ACETAMINOPHEN 325 MG PO TABS: 650.0000 mg | ORAL_TABLET | ORAL | Status: DC | PRN

## 2019-06-17 NOTE — ED Notes (Signed)
PT WILL WILL BE TRANSFERRED TO [facility] 7/2 AFTER 9AM. THE SHERIFF WILL NOT BE ABLE TO TRANSPORT UNTIL THEN. [facility] MADE AWARE.
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
## 2019-06-17 NOTE — Progress Notes (Addendum)
CSW received a call from [place] at [facility] stating the patient has been offered a bed and has been accepted and that the pt can arrive on either 7/1 or 7/2 after 9am.  The pt's accepting doctor is Dr. Elaina Hoops.  The room number will be TBD on [facility] [address].  The number for report is [phone]  Of note: Pt is IVC's and will have to go by [facility].  CSW will update RN.  Alphonse Guild. Naveya Ellerman, Latanya Presser, LCAS Clinical Social Worker Ph: [phone]
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
## 2019-06-17 NOTE — BH Assessment (Signed)
[facility] Assessment Progress Note  Per Buford Dresser, DO, this pt requires psychiatric hospitalization at this time. Pt presents under IVC initiated by pt's son, which Dr Mariea Clonts has upheld. The following facilities have been contacted to seek placement for this pt, with results as noted:  Beds available, information sent, decision pending:  [facility]   At capacity:  [facility]   At 16:37 this Probation officer handed off to [employer], [place].   Jalene Mullet, [place] Coordinator [phone]

## 2019-06-17 NOTE — ED Provider Notes (Signed)
Patient accepted to [facility].  Accepting physician is Dr. Elaina Hoops.   Sherwood Gambler, MD 06/17/19 2027

## 2019-06-17 NOTE — ED Notes (Signed)
Pt resting in bed. Cooperative. Guarded. Tearful.
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
## 2019-06-17 NOTE — ED Provider Notes (Signed)
[facility] DEPT Provider Note   CSN: [id number] Arrival date & time: 06/17/19  0405     History   Chief Complaint Chief Complaint  Patient presents with  . IVC    HPI [name] is a 48 y.o. female.     HPI 48 year old female with history of ADHD, anxiety, depression, polysubstance abuse and reported history of manic disorder brought in by PD after she was involuntarily committed by her son.  The IVC paperwork reports that patient was erratic earlier today and was pulled over by police for DWI.  Once patient got home, IVC paperwork was taken out by family and she was sent to the ED for further evaluation.  The IVC paperwork alleges that patient has not been compliant with her medications and has not eaten or drank well for the last 2 to 4 days.  She has history of mania and polysubstance abuse.  Patient informs me that she had a minor misunderstanding with the police and she was cooperative with them.  Once she got home police came to pick her up.  She does admit to not taking her medication.  She denies any SI, HI.  She reports history of multiple substance abuse, but it is not new.  She does not think she needs psychiatric evaluation.  Pt denies nausea, emesis, fevers, chills, chest pains, shortness of breath, headaches, abdominal pain, uti like symptoms.   Past Medical History:  Diagnosis Date  . ADHD (attention deficit hyperactivity disorder)   . Anxiety   . Depression   . Hx of staphylococcal septicemia     Patient Active Problem List   Diagnosis Date Noted  . Cigarette smoker 02/05/2018  . Syncope 02/01/2018  . PVC's (premature ventricular contractions)   . Chest pain 01/31/2018  . GAD (generalized anxiety disorder) 08/16/2016  . Pulmonary abscess ([place]) 01/26/2013  . Pneumonia, organism unspecified(486) 01/26/2013  . 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 Depression 01/23/2013  . ADHD (attention  deficit hyperactivity disorder) 01/23/2013  . Chronic back pain 01/23/2013  . Cavitary lesion of lung 01/22/2013  . 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 Hx of staphylococcal infection 11/19/2007    Past Surgical History:  Procedure Laterality Date  . ABDOMINAL HYSTERECTOMY    . BACK SURGERY    . left elbow surgery    . VIDEO BRONCHOSCOPY N/A 01/27/2013   Procedure: VIDEO BRONCHOSCOPY WITH FLUORO;  Surgeon: Elsie Stain, MD;  Location: [facility];  Service: Cardiopulmonary;  Laterality: N/A;     OB History   No obstetric history on file.      Home Medications    Prior to Admission medications   Not on File    Family History History reviewed. No pertinent family history.  Social History Social History   Tobacco Use  . Smoking status: Current Some Day Smoker    Packs/day: 1.00    Years: 20.00    Pack years: 20.00    Types: Cigarettes  . Smokeless tobacco: Never Used  Substance Use Topics  . Alcohol use: No  . Drug use: No     Allergies   Penicillins   Review of Systems Review of Systems  Constitutional: Positive for activity change.  Respiratory: Negative for shortness of breath.   Cardiovascular: Negative for chest pain.  Gastrointestinal: Negative for nausea and vomiting.  Hematological: Does not bruise/bleed easily.  All other systems reviewed and are negative.    Physical Exam Updated Vital Signs  BP (!) 132/94   Pulse 94   Temp 98.6 F (37 C) (Oral)   Resp 17   Ht 5\' 5"  (1.651 m)   Wt 56.7 kg   SpO2 99%   BMI 20.80 kg/m   Physical Exam Vitals signs and nursing note reviewed.  Constitutional:      Appearance: She is well-developed.  HENT:     Head: Normocephalic and atraumatic.  Neck:     Musculoskeletal: Normal range of motion and neck supple.  Cardiovascular:     Rate and Rhythm: Normal rate.  Pulmonary:     Effort: Pulmonary effort is normal.  Abdominal:     General: Bowel sounds are normal.  Skin:    General: Skin is warm  and dry.  Neurological:     Mental Status: She is alert and oriented to person, place, and time.  Psychiatric:        Behavior: Behavior normal.        Judgment: Judgment normal.      ED Treatments / Results  Labs (all labs ordered are listed, but only abnormal results are displayed) Labs Reviewed  COMPREHENSIVE METABOLIC PANEL - Abnormal; Notable for the following components:      Result Value   Potassium 3.4 (*)    Glucose, Bld 112 (*)    Total Protein 8.3 (*)    All other components within normal limits  CBC - Abnormal; Notable for the following components:   WBC 11.6 (*)    Hemoglobin 15.6 (*)    All other components within normal limits  RAPID URINE DRUG SCREEN, HOSP PERFORMED - Abnormal; Notable for the following components:   Benzodiazepines POSITIVE (*)    Amphetamines POSITIVE (*)    Tetrahydrocannabinol POSITIVE (*)    All other components within normal limits  ACETAMINOPHEN LEVEL - Abnormal; Notable for the following components:   Acetaminophen (Tylenol), Serum <10 (*)    All other components within normal limits  ETHANOL  SALICYLATE LEVEL    EKG None  Radiology No results found.  Procedures Procedures (including critical care time)  Medications Ordered in ED Medications  OLANZapine zydis (ZYPREXA) disintegrating tablet 5 mg (has no administration in time range)    And  LORazepam (ATIVAN) tablet 1 mg (has no administration in time range)    And  ziprasidone (GEODON) injection 20 mg (has no administration in time range)  acetaminophen (TYLENOL) tablet 650 mg (has no administration in time range)  ondansetron (ZOFRAN) tablet 4 mg (has no administration in time range)  zolpidem (AMBIEN) tablet 5 mg (has no administration in time range)     Initial Impression / Assessment and Plan / ED Course  I have reviewed the triage vital signs and the nursing notes.  Pertinent labs & imaging results that were available during my care of the patient were  reviewed by me and considered in my medical decision making (see chart for details).        48 year old female brought into the ER for psychiatric evaluation. Patient has history of mental illness.  It appears that she had a DWI earlier today because she was behaving erratically.  Sequently her family has committed her and want psychiatric evaluation.  Patient has no SI, HI.  She is cooperative during my encounter.  She is actually sleeping, and not to wake her up -and she does not appear to be decompensated.  I will think patient meets IVC criteria.  She is cooperative and willing to be seen by psychiatry  in the morning.  BH has been consulted.  Patient is medically cleared.  Final Clinical Impressions(s) / ED Diagnoses   Final diagnoses:  [facility])    ED Discharge Orders    None       Varney Biles, MD 06/17/19 [phone]

## 2019-06-17 NOTE — BH Assessment (Signed)
[facility] Assessment Progress Note    Per Dr. Mariea Clonts, patient is recommended for inpatient treatment

## 2019-06-17 NOTE — ED Notes (Signed)
Bed: WTR6 Expected date:  Expected time:  Means of arrival:  Comments: 

## 2019-06-17 NOTE — BH Assessment (Signed)
Tele Assessment Note   Patient Name: [name] MRN: [id number] Referring Physician: Kathrynn Humble Location of Patient: [facility] Location of Provider: [place] Shamblin is an 48 y.o. female who presented to [facility] on IVC.  Patient has bipolar disorder and is non-compliant with her medications and abusing other drugs.  She was charged with DWI last night after attempting to steal cat food and cigarettes from a store.  Patient was acting very bizarre and her children state that she has not been eating or sleeping and generally not taking care of herself.   Patient is agitated and uncooperative this morning.  She states, "I think I have the pandemic.  I am losing my mind, I don't know who is sick and who is not.  Last night I threw a tantrum."  Patient states, "I just want to go home.  I could not feed my cat last night and he is probably dead and I didn't even get to smoke a cigarette."  Patient denies current SI, but states that she has attempted to kill herself in the past and states that she was hospitalized at [facility], but she does not know when it was that she was hospitalized.  She states that she is currently seeing Dr. Graylon Good on an outpatient basis, but states that she has not been taking two of her medications and states that she has not taken them in the past couple months.  Patient states that she is not homicidal or psychotic.  She states that she has not been sleeping or eating and states that she has lost "lots" of weight. When asked about her drug use, (she tested positive for THC, Benzos and Amphetamines), patient would not provide any specifics concerning her use and became somewhat agitated and indicated that she was tired of everyone asking all of these questions.  Patient presented as being manic, her thoughts disorganized and her speech pressured. She appeared to be oriented to her situation, but she was not very cooperative.  Her judgment, insight  and impulse control appears to be impaired.  She did not appear to be responding to any internal stimuli. Her memory appeared to be intact.  Her psych-motor activity was agitated and restless.  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 ADHD (attention deficit hyperactivity disorder)   . Anxiety   . Depression   . Hx of staphylococcal septicemia     Past Surgical History:  Procedure Laterality Date  . ABDOMINAL HYSTERECTOMY    . BACK SURGERY    . left elbow surgery    . VIDEO BRONCHOSCOPY N/A 01/27/2013   Procedure: VIDEO BRONCHOSCOPY WITH FLUORO;  Surgeon: Elsie Stain, MD;  Location: [place];  Service: Cardiopulmonary;  Laterality: N/A;    Family History: History reviewed. No pertinent family history.  Social History:  reports that she has been smoking cigarettes. She has a 20.00 pack-year smoking history. She has never used smokeless tobacco. She reports that she does not drink alcohol or use drugs.  Additional Social History:  Alcohol / Drug Use Pain Medications: see MAR Prescriptions: see MAR Over the Counter: see MAR History of alcohol / drug use?: Yes Longest period of sobriety (when/how long): none reported Negative Consequences of Use: Financial, Legal, Personal relationships, Work / School Substance #1 Name of Substance 1: marijuana 1 - Age of First Use: Patient refuses  to answer 1 - Amount (size/oz): Patient refuses to answer 1 - Frequency: Patient refuses to answer 1 - Duration: Patient refuses to answer 1 - Last Use / Amount: Patient refuses to answer Substance #2 Name of Substance 2: Benzodiazepines 2 - Age of First Use: Patient refuses to answer 2 - Amount (size/oz): Patient refuses to answer 2 - Frequency: Patient refuses to answer 2 - Duration: Patient refuses to answer 2 - Last Use / Amount: Patient refuses to  answer Substance #3 Name of Substance 3: amphetamines 3 - Age of First Use: Patient refuses to answer 3 - Amount (size/oz): Patient refuses to answer 3 - Frequency: Patient refuses to answer 3 - Duration: Patient refuses to answer 3 - Last Use / Amount: Patient refuses to answer  CIWA: CIWA-Ar BP: (!) 132/94 Pulse Rate: 94 COWS:    Allergies:  Allergies  Allergen Reactions  . Penicillins Other (See Comments)    Childhood reaction    Home Medications: (Not in a hospital admission)   OB/GYN Status:  No LMP recorded. Patient has had a hysterectomy.  General Assessment Data Assessment unable to be completed: Yes Reason for not completing assessment: Pt asleep Location of Assessment: [facility] TTS Assessment: In system Is this a Tele or Face-to-Face Assessment?: Tele Assessment Is this an Initial Assessment or a Re-assessment for this encounter?: Initial Assessment Patient Accompanied by:: Other(police) Language Other than English: No Living Arrangements: Other (Comment)(lives with daughter) What gender do you identify as?: Female Marital status: Single Maiden name: (unknown) Pregnancy Status: No Living Arrangements: Children Can pt return to current living arrangement?: Yes Admission Status: Involuntary Petitioner: Police Is patient capable of signing voluntary admission?: Yes Referral Source: Other(police) Insurance type: Environmental education officer)     Crisis Care Plan Living Arrangements: Children Legal Guardian: (self) Name of Psychiatrist: Dr Graylon Good Name of Therapist: none  Education Status Is patient currently in school?: No Is the patient employed, unemployed or receiving disability?: Receiving disability income  Risk to self with the past 6 months Suicidal Ideation: No Has patient been a risk to self within the past 6 months prior to admission? : No Suicidal Intent: No Has patient had any suicidal intent within the past 6 months prior to admission? : No Is  patient at risk for suicide?: Yes Suicidal Plan?: No Has patient had any suicidal plan within the past 6 months prior to admission? : No Access to Means: No What has been your use of drugs/alcohol within the last 12 months?: (daily use according to patient's children) Previous Attempts/Gestures: Yes How many times?: (unknown) Other Self Harm Risks: (substance use and mania) Triggers for Past Attempts: Unknown Intentional Self Injurious Behavior: None Family Suicide History: No Recent stressful life event(s): Legal Issues Persecutory voices/beliefs?: No Depression: Yes Depression Symptoms: Despondent, Insomnia, Loss of interest in usual pleasures, Feeling worthless/self pity Substance abuse history and/or treatment for substance abuse?: No Suicide prevention information given to non-admitted patients: Not applicable  Risk to Others within the past 6 months Homicidal Ideation: No Does patient have any lifetime risk of violence toward others beyond the six months prior to admission? : No Thoughts of Harm to Others: No Current Homicidal Intent: No Current Homicidal Plan: No Access to Homicidal Means: No Identified Victim: none History of harm to others?: No Assessment of Violence: None Noted Violent Behavior Description: none Does patient have access to weapons?: No Criminal Charges Pending?: No Does patient have a court date: No Is patient on probation?: No  Psychosis Hallucinations: None  noted Delusions: None noted  Mental Status Report Appearance/Hygiene: Disheveled Eye Contact: Poor Motor Activity: Agitation, Freedom of movement Speech: Pressured Level of Consciousness: Alert Mood: Depressed, Anxious, Apathetic Anxiety Level: Severe Thought Processes: Flight of Ideas Judgement: Impaired Orientation: Person, Place, Time, Situation Obsessive Compulsive Thoughts/Behaviors: None  Cognitive Functioning Concentration: Decreased Memory: Recent Intact, Remote Intact Is  patient IDD: No Insight: Poor Impulse Control: Poor Appetite: Poor Have you had any weight changes? : Loss Amount of the weight change? (lbs): 15 lbs Sleep: Decreased Total Hours of Sleep: (states that she is not sleeping at all) Vegetative Symptoms: Decreased grooming  ADLScreening [facility] Assessment Services) Patient's cognitive ability adequate to safely complete daily activities?: Yes Patient able to express need for assistance with ADLs?: Yes Independently performs ADLs?: Yes (appropriate for developmental age)  Prior Inpatient Therapy Prior Inpatient Therapy: Yes Prior Therapy Dates: unknown Prior Therapy Facilty/Provider(s): [facility] Reason for Treatment: bipolar  Prior Outpatient Therapy Prior Outpatient Therapy: Yes Prior Therapy Dates: (active) Prior Therapy Facilty/Provider(s): Dr. Graylon Good Reason for Treatment: (bipolar) Does patient have an ACCT team?: No Does patient have Intensive In-House Services?  : No Does patient have [facility] services? : No Does patient have P4CC services?: No  ADL Screening (condition at time of admission) Patient's cognitive ability adequate to safely complete daily activities?: Yes Is the patient deaf or have difficulty hearing?: No Does the patient have difficulty seeing, even when wearing glasses/contacts?: No Does the patient have difficulty concentrating, remembering, or making decisions?: No Patient able to express need for assistance with ADLs?: Yes Does the patient have difficulty dressing or bathing?: No Independently performs ADLs?: Yes (appropriate for developmental age) Does the patient have difficulty walking or climbing stairs?: No Weakness of Legs: None Weakness of Arms/Hands: None  Home Assistive Devices/Equipment Home Assistive Devices/Equipment: None  Therapy Consults (therapy consults require a physician order) PT Evaluation Needed: No OT Evalulation Needed: No SLP Evaluation Needed: No Abuse/Neglect Assessment  (Assessment to be complete while patient is alone) Abuse/Neglect Assessment Can Be Completed: Unable to assess, patient is non-responsive or altered mental status   Consults Spiritual Care Consult Needed: No Social Work Consult Needed: No Regulatory affairs officer (For Healthcare) Does Patient Have a Medical Advance Directive?: No Would patient like information on creating a medical advance directive?: No - Patient declined Nutrition Screen- [facility] Has the patient recently lost weight without trying?: Yes, 14-23 lbs. Has the patient been eating poorly because of a decreased appetite?: Yes Malnutrition Screening Tool Score: 3        Disposition: Per Dr. Mariea Clonts, patient is recommended for inpatient treatment Disposition Initial Assessment Completed for this Encounter: Yes  This service was provided via telemedicine using a 2-way, interactive audio and video technology.  Names of all persons participating in this telemedicine service and their role in this encounter. Name: [name] Role: patient  Name: [name] Role: TTS  Name:  Role:   Name:  Role:     Reatha Armour 06/17/2019 10:04 AM

## 2019-06-17 NOTE — ED Notes (Signed)
Pt alert and oriented. Pt denies any si, hi, avh. Pt calm and cooperative. Pt resting in bed. Pt denies any pain or discomfort. Pt safe, will continue to monitor.

## 2019-06-18 NOTE — ED Notes (Signed)
Alert/pleasent/watching tv, waiting for transport. Pt ate approx 40% of breakfast.

## 2019-06-18 NOTE — ED Notes (Signed)
Sheriff here to transport, pt up to the bathroom

## 2019-06-18 NOTE — ED Notes (Signed)
Pt. Given boxed meal, chips and yogurt.

## 2019-06-18 NOTE — ED Notes (Signed)
[facility] intake notified that pt is in transit

## 2019-06-18 NOTE — ED Notes (Signed)
Pt sleeping soundly, aroused w/o difficulty.  Sheriff is on the way to pick her up, pt aware, eating breakfast.

## 2019-06-18 NOTE — ED Notes (Addendum)
Freda Munro RN contacted at [facility],verified report has been called per Freda Munro, no update needed at this time.  Pt can arrive after 9:00a, verified arrival time with intake.  Sheriff contacted for transport.

## 2019-06-18 NOTE — ED Notes (Addendum)
Pt ambulatory w/o difficulty with sheriff to [facility].  EMTELA, MAR report, transfer report, face sheet, ivc papers, and belongings given to sheriff. Flip flops given, pt masked.

## 2020-10-18 ENCOUNTER — Encounter (HOSPITAL_COMMUNITY): Payer: Self-pay | Admitting: Urgent Care

## 2020-10-18 ENCOUNTER — Ambulatory Visit (HOSPITAL_COMMUNITY)
Admission: EM | Admit: 2020-10-18 | Discharge: 2020-10-18 | Disposition: A | Discharge status: Home / Self Care | Payer: Medicare Other | Attending: Urgent Care | Admitting: Urgent Care

## 2020-10-18 ENCOUNTER — Other Ambulatory Visit: Payer: Self-pay

## 2020-10-18 DIAGNOSIS — M542 Cervicalgia: Secondary | ICD-10-CM

## 2020-10-18 DIAGNOSIS — R519 Headache, unspecified: Secondary | ICD-10-CM | POA: Diagnosis not present

## 2020-10-18 MED ORDER — KETOROLAC TROMETHAMINE 60 MG/2ML IM SOLN
INTRAMUSCULAR | Status: AC
  Filled 2020-10-18: qty 2

## 2020-10-18 MED ORDER — ONDANSETRON 8 MG PO TBDP: 8.0000 mg | ORAL_TABLET | Freq: Three times a day (TID) | ORAL | 0 refills | Status: DC | PRN

## 2020-10-18 MED ORDER — CELECOXIB 200 MG PO CAPS: 200.0000 mg | ORAL_CAPSULE | Freq: Two times a day (BID) | ORAL | 0 refills | Status: DC

## 2020-10-18 MED ORDER — ONDANSETRON HCL 4 MG/2ML IJ SOLN
4.0000 mg | Freq: Once | INTRAMUSCULAR | Status: AC
  Administered 2020-10-18: 4 mg via INTRAMUSCULAR

## 2020-10-18 MED ORDER — KETOROLAC TROMETHAMINE 60 MG/2ML IM SOLN
60.0000 mg | Freq: Once | INTRAMUSCULAR | Status: AC
  Administered 2020-10-18: 60 mg via INTRAMUSCULAR

## 2020-10-18 MED ORDER — ONDANSETRON HCL 4 MG/2ML IJ SOLN
INTRAMUSCULAR | Status: AC
  Filled 2020-10-18: qty 2

## 2020-10-18 NOTE — ED Triage Notes (Signed)
Pt in with c/o migraine for the last 3 days, also c/o nausea. States that light makes pain worse.  Pain is at temples and spreads to neck.  Has had medication for pain with no relief  Denies any vomiting

## 2020-10-18 NOTE — ED Provider Notes (Signed)
[facility]   MRN: [id number] DOB: [birth year]  Subjective:   [name] is a 49 y.o. female presenting for 3-day history of acute onset persistent bilateral temporal headache, posterior headache, neck pain. Has had associated photophobia, nausea. Denies history of migraines. States that she uses BC powders regularly and knows she is not supposed to. Has also been using Aleve, ibuprofen for headaches. Denies confusion, weakness, vision change, chest pain, shortness of breath, vomiting, belly pain.  No current facility-administered medications for this encounter. No current outpatient medications on file.   Allergies  Allergen Reactions  . Penicillins Other (See Comments)    Childhood reaction    Past Medical History:  Diagnosis Date  . ADHD (attention deficit hyperactivity disorder)   . Anxiety   . Depression   . Hx of staphylococcal septicemia      Past Surgical History:  Procedure Laterality Date  . ABDOMINAL HYSTERECTOMY    . BACK SURGERY    . left elbow surgery    . VIDEO BRONCHOSCOPY N/A 01/27/2013   Procedure: VIDEO BRONCHOSCOPY WITH FLUORO;  Surgeon: Elsie Stain, MD;  Location: [place];  Service: Cardiopulmonary;  Laterality: N/A;    No family history on file.  Social History   Tobacco Use  . Smoking status: Current Some Day Smoker    Packs/day: 1.00    Years: 20.00    Pack years: 20.00    Types: Cigarettes  . Smokeless tobacco: Never Used  Vaping Use  . Vaping Use: Never used  Substance Use Topics  . Alcohol use: No  . Drug use: No    ROS   Objective:   Vitals: BP (!) 131/95 (BP Location: Right Arm)   Pulse 68   Temp 97.7 F (36.5 C) (Oral)   Resp 17   SpO2 99%   Physical Exam Constitutional:      General: She is not in acute distress.    Appearance: Normal appearance. She is well-developed. She is not ill-appearing, toxic-appearing or diaphoretic.  HENT:     Head: Normocephalic and atraumatic.     Nose:  Nose normal.     Mouth/Throat:     Mouth: Mucous membranes are moist.     Pharynx: Oropharynx is clear.  Eyes:     General: No scleral icterus.       Right eye: No discharge.        Left eye: No discharge.     Extraocular Movements: Extraocular movements intact.     Conjunctiva/sclera: Conjunctivae normal.     Pupils: Pupils are equal, round, and reactive to light.  Cardiovascular:     Rate and Rhythm: Normal rate.  Pulmonary:     Effort: Pulmonary effort is normal.  Skin:    General: Skin is warm and dry.  Neurological:     General: No focal deficit present.     Mental Status: She is alert and oriented to person, place, and time.     Cranial Nerves: No cranial nerve deficit.     Motor: No weakness.     Coordination: Coordination normal.     Gait: Gait normal.     Deep Tendon Reflexes: Reflexes normal.     Comments: Negative Kernig and Brudzinski.  Negative Romberg and pronator drift.  Psychiatric:        Mood and Affect: Mood normal.        Behavior: Behavior normal.        Thought Content: Thought content normal.  Judgment: Judgment normal.    IM Toradol, Zofran in clinic.   Assessment and Plan :   I have reviewed the PDMP during this encounter.  1. Bad headache   2. Temporal headache   3. Neck pain     Patient denies history of migraines.  States that she is having significant stressors at work and at home with family and her children.  Recommended general health care maintenance for what I suspect is more tension type headache.  Celebrex for severe headaches, Zofran for nausea and vomiting.  Follow-up with PCP. Counseled patient on potential for adverse effects with medications prescribed/recommended today, ER and return-to-clinic precautions discussed, patient verbalized understanding.    Jaynee Eagles, PA-C 10/18/20 1447

## 2020-11-13 ENCOUNTER — Encounter (HOSPITAL_COMMUNITY): Payer: Self-pay | Admitting: Emergency Medicine

## 2020-11-13 ENCOUNTER — Ambulatory Visit (HOSPITAL_COMMUNITY)
Admission: EM | Admit: 2020-11-13 | Discharge: 2020-11-14 | Disposition: A | Discharge status: Other Acute Inpatient Hospital | Payer: Medicare Other | Attending: Psychiatry | Admitting: Psychiatry

## 2020-11-13 ENCOUNTER — Other Ambulatory Visit: Payer: Self-pay

## 2020-11-13 DIAGNOSIS — F17210 Nicotine dependence, cigarettes, uncomplicated: Secondary | ICD-10-CM | POA: Insufficient documentation

## 2020-11-13 DIAGNOSIS — F333 Major depressive disorder, recurrent, severe with psychotic symptoms: Secondary | ICD-10-CM | POA: Diagnosis not present

## 2020-11-13 DIAGNOSIS — Z79899 Other long term (current) drug therapy: Secondary | ICD-10-CM | POA: Insufficient documentation

## 2020-11-13 DIAGNOSIS — Z20822 Contact with and (suspected) exposure to covid-19: Secondary | ICD-10-CM | POA: Insufficient documentation

## 2020-11-13 DIAGNOSIS — Z880 Allergy status to penicillin: Secondary | ICD-10-CM | POA: Insufficient documentation

## 2020-11-13 LAB — CBC WITH DIFFERENTIAL/PLATELET
Abs Immature Granulocytes: 0.04 10*3/uL (ref 0.00–0.07)
Basophils Absolute: 0.1 10*3/uL (ref 0.0–0.1)
Basophils Relative: 1 %
Eosinophils Absolute: 0 10*3/uL (ref 0.0–0.5)
Eosinophils Relative: 0 %
HCT: 50.2 % — ABNORMAL HIGH (ref 36.0–46.0)
Hemoglobin: 16.9 g/dL — ABNORMAL HIGH (ref 12.0–15.0)
Immature Granulocytes: 0 %
Lymphocytes Relative: 31 %
Lymphs Abs: 3.1 10*3/uL (ref 0.7–4.0)
MCH: 32.2 pg (ref 26.0–34.0)
MCHC: 33.7 g/dL (ref 30.0–36.0)
MCV: 95.6 fL (ref 80.0–100.0)
Monocytes Absolute: 0.7 10*3/uL (ref 0.1–1.0)
Monocytes Relative: 7 %
Neutro Abs: 6 10*3/uL (ref 1.7–7.7)
Neutrophils Relative %: 61 %
Platelets: 308 10*3/uL (ref 150–400)
RBC: 5.25 MIL/uL — ABNORMAL HIGH (ref 3.87–5.11)
RDW: 12.5 % (ref 11.5–15.5)
WBC: 9.8 10*3/uL (ref 4.0–10.5)
nRBC: 0 % (ref 0.0–0.2)

## 2020-11-13 LAB — LIPID PANEL
Cholesterol: 219 mg/dL — ABNORMAL HIGH (ref 0–200)
HDL: 86 mg/dL (ref >40)
LDL Cholesterol: 116 mg/dL — ABNORMAL HIGH (ref 0–99)
Total CHOL/HDL Ratio: 2.5 RATIO
Triglycerides: 83 mg/dL (ref <150)
VLDL: 17 mg/dL (ref 0–40)

## 2020-11-13 LAB — MAGNESIUM: Magnesium: 1.7 mg/dL (ref 1.7–2.4)

## 2020-11-13 LAB — COMPREHENSIVE METABOLIC PANEL
ALT: 17 U/L (ref 0–44)
AST: 20 U/L (ref 15–41)
Albumin: 4.9 g/dL (ref 3.5–5.0)
Alkaline Phosphatase: 96 U/L (ref 38–126)
Anion gap: 14 (ref 5–15)
BUN: 8 mg/dL (ref 6–20)
CO2: 25 mmol/L (ref 22–32)
Calcium: 10.4 mg/dL — ABNORMAL HIGH (ref 8.9–10.3)
Chloride: 99 mmol/L (ref 98–111)
Creatinine, Ser: 0.73 mg/dL (ref 0.44–1.00)
GFR, Estimated: >60 mL/min (ref >60)
Glucose, Bld: 87 mg/dL (ref 70–99)
Potassium: 3.4 mmol/L — ABNORMAL LOW (ref 3.5–5.1)
Sodium: 138 mmol/L (ref 135–145)
Total Bilirubin: 0.5 mg/dL (ref 0.3–1.2)
Total Protein: 8.4 g/dL — ABNORMAL HIGH (ref 6.5–8.1)

## 2020-11-13 LAB — ETHANOL: Alcohol, Ethyl (B): <10 mg/dL (ref <10)

## 2020-11-13 LAB — POCT URINE DRUG SCREEN - MANUAL ENTRY (I-SCREEN)
POC Amphetamine UR: NOT DETECTED
POC Buprenorphine (BUP): NOT DETECTED
POC Cocaine UR: NOT DETECTED
POC Marijuana UR: POSITIVE — AB
POC Methadone UR: NOT DETECTED
POC Methamphetamine UR: NOT DETECTED
POC Morphine: NOT DETECTED
POC Oxazepam (BZO): POSITIVE — AB
POC Oxycodone UR: NOT DETECTED
POC Secobarbital (BAR): NOT DETECTED

## 2020-11-13 LAB — RESP PANEL BY RT-PCR (FLU A&B, COVID) ARPGX2
Influenza A by PCR: NEGATIVE
Influenza B by PCR: NEGATIVE
SARS Coronavirus 2 by RT PCR: NEGATIVE

## 2020-11-13 LAB — HEMOGLOBIN A1C
Hgb A1c MFr Bld: 5.5 % (ref 4.8–5.6)
Mean Plasma Glucose: 111.15 mg/dL

## 2020-11-13 LAB — TSH: TSH: 2.549 u[IU]/mL (ref 0.350–4.500)

## 2020-11-13 LAB — POC SARS CORONAVIRUS 2 AG -  ED: SARS Coronavirus 2 Ag: NEGATIVE

## 2020-11-13 LAB — GLUCOSE, CAPILLARY: Glucose-Capillary: 94 mg/dL (ref 70–99)

## 2020-11-13 MED ORDER — GABAPENTIN 400 MG PO CAPS
800.0000 mg | ORAL_CAPSULE | Freq: Once | ORAL | Status: AC
  Administered 2020-11-13: 800 mg via ORAL
  Filled 2020-11-13: qty 2

## 2020-11-13 MED ORDER — ALUM & MAG HYDROXIDE-SIMETH 200-200-20 MG/5ML PO SUSP: 30.0000 mL | ORAL | Status: DC | PRN

## 2020-11-13 MED ORDER — MAGNESIUM HYDROXIDE 400 MG/5ML PO SUSP: 30.0000 mL | Freq: Every day | ORAL | Status: DC | PRN

## 2020-11-13 MED ORDER — ACETAMINOPHEN 325 MG PO TABS: 650.0000 mg | ORAL_TABLET | Freq: Four times a day (QID) | ORAL | Status: DC | PRN

## 2020-11-13 MED ORDER — ALPRAZOLAM 0.5 MG PO TABS
1.0000 mg | ORAL_TABLET | Freq: Two times a day (BID) | ORAL | Status: DC | PRN
  Administered 2020-11-13: 1 mg via ORAL
  Filled 2020-11-13: qty 2

## 2020-11-13 NOTE — ED Notes (Signed)
Pt reports she is under stress, tired and anxious.  Family reports pt exhibiting bizarre behavior at work, rolling around on the ground and paranoid behavior.  Pt A&O x 4, calm & cooperative, skin search completed, monitoring for safety.
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
## 2020-11-13 NOTE — ED Notes (Signed)
Was given lunch.

## 2020-11-13 NOTE — ED Triage Notes (Addendum)
Pt reports she has been under stress, denies SI, HI or AVH.  Denies drug or alcohol use.  Pt reports family brought her in for evaluation.

## 2020-11-13 NOTE — ED Provider Notes (Signed)
Behavioral Health Admission H&P [facility] & OBS)  Date: 11/13/20 Patient Name: [name] MRN: [id number] Chief Complaint:  Chief Complaint  Patient presents with  . Stress     Diagnoses:    HPI: [name], 49 y.o., female patient was brought in by her two children son, Roshonda Sperl, and her daughter, [name]. to [facility] voluntarily. On evaluation, the patient states that she has been under a lot of stress lately. The patient says she has currently prescribed Xanax, Adderall, and Gabapentin which she admits to being complaint with her medication. The patient has a history of psychotic symptoms and is now receiving medication management with Dr. Pearson Grippe. The patient says she was brought to [facility] because her children are concerned and feel she should be evaluated. The patient discussed that she was very emotional tonight and was tearful. She says she has been stressed recently because her daughter got a car and worked for a Copywriter, advertising. The patient describes her mood recently as "tired and anxious." She denies current suicidal ideation.  During the patient assessment, she is alert and oriented x 4, pleasant, and cooperative. Speech is clear and coherent, normal pace, normal volume.  The mood is depressed, and affect congruent with mood. The thought process is coherent. Reports auditory hallucinations. States that she is unable to describe the voices. No indication that the patient is responding to internal stimuli. The patient denies paranoia. There is no evidence of delusional thought content. She denies suicidal ideations. The patient denies homicidal ideations and substance abuse.       PHQ 2-9:     ED from 06/17/2019 in [place] DEPT  C-SSRS RISK CATEGORY No Risk       Total Time spent with patient: 30 minutes  Musculoskeletal  Strength & Muscle Tone: within normal limits Gait & Station: normal Patient leans: N/A  Psychiatric Specialty  Exam  Presentation General Appearance: Appropriate for Environment  Eye Contact:Fair  Speech:Clear and Coherent;Normal Rate  Speech Volume:Normal  Handedness:Right   Mood and Affect  Mood:Depressed  Affect:Blunt;Depressed;Congruent   Thought Process  Thought Processes:Coherent  Descriptions of Associations:Loose  Orientation:Full (Time, Place and Person)  Thought Content:Logical  Hallucinations:Hallucinations: None  Ideas of Reference:None  Suicidal Thoughts:Suicidal Thoughts: No  Homicidal Thoughts:Homicidal Thoughts: No   Sensorium  Memory:Immediate Good;Recent Fair;Remote Fair  Judgment:Fair  Insight:Lacking   Executive Functions  Concentration:Fair  Attention Span:Fair  [place]  Language:Good   Psychomotor Activity  Psychomotor Activity:Psychomotor Activity: Normal   Assets  Assets:Desire for Improvement;Communication Skills;Resilience;Social Support   Sleep  Sleep:Sleep: Good   Physical Exam Vitals and nursing note reviewed. Exam conducted with a chaperone present.  Constitutional:      Appearance: Normal appearance.  HENT:     Right Ear: External ear normal.     Left Ear: External ear normal.     Nose: Nose normal.  Cardiovascular:     Rate and Rhythm: Normal rate.  Pulmonary:     Effort: Pulmonary effort is normal.  Musculoskeletal:     Cervical back: Normal range of motion and neck supple.  Neurological:     General: No focal deficit present.     Mental Status: She is alert and oriented to person, place, and time. Mental status is at baseline.  Psychiatric:        Attention and Perception: Attention and perception normal.        Mood and Affect: Mood is anxious and depressed.  Behavior: Behavior normal.        Thought Content: Thought content is paranoid.        Cognition and Memory: Cognition normal.    Review of Systems  Psychiatric/Behavioral: Positive for depression and  substance abuse. The patient is nervous/anxious.     Blood pressure (!) 119/98, pulse 89, temperature 98 F (36.7 C), temperature source Tympanic, resp. rate 20, height 5' 5.35" (1.66 m), weight 105 lb (47.6 kg), SpO2 98 %. Body mass index is 17.28 kg/m.  Past Psychiatric History:   Is the patient at risk to self? No  Has the patient been a risk to self in the past 6 months? No .    Has the patient been a risk to self within the distant past? No   Is the patient a risk to others? No   Has the patient been a risk to others in the past 6 months? No   Has the patient been a risk to others within the distant past? No   Past Medical History:  Past Medical History:  Diagnosis Date  . ADHD (attention deficit hyperactivity disorder)   . Anxiety   . Depression   . Hx of staphylococcal septicemia     Past Surgical History:  Procedure Laterality Date  . ABDOMINAL HYSTERECTOMY    . BACK SURGERY    . left elbow surgery    . VIDEO BRONCHOSCOPY N/A 01/27/2013   Procedure: VIDEO BRONCHOSCOPY WITH FLUORO;  Surgeon: Elsie Stain, MD;  Location: [facility];  Service: Cardiopulmonary;  Laterality: N/A;    Family History: History reviewed. No pertinent family history.  Social History:  Social History   Socioeconomic History  . Marital status: Single    Spouse name: Not on file  . Number of children: Not on file  . Years of education: Not on file  . Highest education level: Not on file  Occupational History  . Not on file  Tobacco Use  . Smoking status: Current Some Day Smoker    Packs/day: 1.00    Years: 20.00    Pack years: 20.00    Types: Cigarettes  . Smokeless tobacco: Never Used  Vaping Use  . Vaping Use: Never used  Substance and Sexual Activity  . Alcohol use: No  . Drug use: No  . Sexual activity: Not on file  Other Topics Concern  . Not on file  Social History Narrative  . Not on file   Social Determinants of Health   Financial Resource Strain:   .  Difficulty of Paying Living Expenses: Not on file  Food Insecurity:   . Worried About Charity fundraiser in the Last Year: Not on file  . Ran Out of Food in the Last Year: Not on file  Transportation Needs:   . Lack of Transportation (Medical): Not on file  . Lack of Transportation (Non-Medical): Not on file  Physical Activity:   . Days of Exercise per Week: Not on file  . Minutes of Exercise per Session: Not on file  Stress:   . Feeling of Stress : Not on file  Social Connections:   . Frequency of Communication with Friends and Family: Not on file  . Frequency of Social Gatherings with Friends and Family: Not on file  . Attends Religious Services: Not on file  . Active Member of Clubs or Organizations: Not on file  . Attends Archivist Meetings: Not on file  . Marital Status: Not on file  Intimate  Partner Violence:   . Fear of Current or Ex-Partner: Not on file  . Emotionally Abused: Not on file  . Physically Abused: Not on file  . Sexually Abused: Not on file    SDOH:  SDOH Screenings   Alcohol Screen:   . Last Alcohol Screening Score (AUDIT): Not on file  Depression (PHQ2-9):   . PHQ-2 Score: Not on file  Financial Resource Strain:   . Difficulty of Paying Living Expenses: Not on file  Food Insecurity:   . Worried About Charity fundraiser in the Last Year: Not on file  . Ran Out of Food in the Last Year: Not on file  Housing:   . Last Housing Risk Score: Not on file  Physical Activity:   . Days of Exercise per Week: Not on file  . Minutes of Exercise per Session: Not on file  Social Connections:   . Frequency of Communication with Friends and Family: Not on file  . Frequency of Social Gatherings with Friends and Family: Not on file  . Attends Religious Services: Not on file  . Active Member of Clubs or Organizations: Not on file  . Attends Archivist Meetings: Not on file  . Marital Status: Not on file  Stress:   . Feeling of Stress : Not on  file  Tobacco Use: High Risk  . Smoking Tobacco Use: Current Some Day Smoker  . Smokeless Tobacco Use: Never Used  Transportation Needs:   . Film/video editor (Medical): Not on file  . Lack of Transportation (Non-Medical): Not on file    Last Labs:  No visits with results within 6 Month(s) from this visit.  Latest known visit with results is:  Admission on 06/17/2019, Discharged on 06/18/2019  Component Date Value Ref Range Status  . Sodium 06/17/2019 136  135 - 145 mmol/L Final  . Potassium 06/17/2019 3.4* 3.5 - 5.1 mmol/L Final  . Chloride 06/17/2019 103  98 - 111 mmol/L Final  . CO2 06/17/2019 23  22 - 32 mmol/L Final  . Glucose, Bld 06/17/2019 112* 70 - 99 mg/dL Final  . BUN 06/17/2019 6  6 - 20 mg/dL Final  . Creatinine, Ser 06/17/2019 0.71  0.44 - 1.00 mg/dL Final  . Calcium 06/17/2019 9.7  8.9 - 10.3 mg/dL Final  . Total Protein 06/17/2019 8.3* 6.5 - 8.1 g/dL Final  . Albumin 06/17/2019 4.6  3.5 - 5.0 g/dL Final  . AST 06/17/2019 22  15 - 41 U/L Final  . ALT 06/17/2019 12  0 - 44 U/L Final  . Alkaline Phosphatase 06/17/2019 90  38 - 126 U/L Final  . Total Bilirubin 06/17/2019 0.8  0.3 - 1.2 mg/dL Final  . GFR calc non Af Amer 06/17/2019 >60  >60 mL/min Final  . GFR calc Af Amer 06/17/2019 >60  >60 mL/min Final  . Anion gap 06/17/2019 10  5 - 15 Final   Performed at [facility], [place] [address], [place], [place] [address]  . WBC 06/17/2019 11.6* 4.0 - 10.5 K/uL Final  . RBC 06/17/2019 4.81  3.87 - 5.11 MIL/uL Final  . Hemoglobin 06/17/2019 15.6* 12.0 - 15.0 g/dL Final  . HCT 06/17/2019 45.2  36 - 46 % Final  . MCV 06/17/2019 94.0  80.0 - 100.0 fL Final  . MCH 06/17/2019 32.4  26.0 - 34.0 pg Final  . MCHC 06/17/2019 34.5  30.0 - 36.0 g/dL Final  . RDW 06/17/2019 12.2  11.5 - 15.5 %  Final  . Platelets 06/17/2019 252  150 - 400 K/uL Final  . nRBC 06/17/2019 0.0  0.0 - 0.2 % Final   Performed at [facility], [place] [address]  [place], [place] [address]  . Opiates 06/17/2019 NONE DETECTED  NONE DETECTED Final  . Cocaine 06/17/2019 NONE DETECTED  NONE DETECTED Final  . Benzodiazepines 06/17/2019 POSITIVE* NONE DETECTED Final  . Amphetamines 06/17/2019 POSITIVE* NONE DETECTED Final  . Tetrahydrocannabinol 06/17/2019 POSITIVE* NONE DETECTED Final  . Barbiturates 06/17/2019 NONE DETECTED  NONE DETECTED Final   Comment: (NOTE) DRUG SCREEN FOR MEDICAL PURPOSES ONLY.  IF CONFIRMATION IS NEEDED FOR ANY PURPOSE, NOTIFY LAB WITHIN 5 DAYS. LOWEST DETECTABLE LIMITS FOR URINE DRUG SCREEN Drug Class                     Cutoff (ng/mL) Amphetamine and metabolites    1000 Barbiturate and metabolites    200 Benzodiazepine                 254 Tricyclics and metabolites     300 Opiates and metabolites        300 Cocaine and metabolites        300 THC                            50 Performed at [name], [place] [address] [place], [place] [address]   . Acetaminophen (Tylenol), Serum 06/17/2019 <10* 10 - 30 ug/mL Final   Comment: (NOTE) Therapeutic concentrations vary significantly. A range of 10-30 ug/mL  may be an effective concentration for many patients. However, some  are best treated at concentrations outside of this range. Acetaminophen concentrations >150 ug/mL at 4 hours after ingestion  and >50 ug/mL at 12 hours after ingestion are often associated with  toxic reactions. Performed at [facility], [place] [address] [place], [place] [address]   . Alcohol, Ethyl (B) 06/17/2019 <10  <10 mg/dL Final   Comment: (NOTE) Lowest detectable limit for serum alcohol is 10 mg/dL. For medical purposes only. Performed at [facility], [place] [address], [place], [place] [address]   . Salicylate Lvl 61/60/7371 11.1  2.8 - 30.0 mg/dL Final   Performed at [place] [address], [place], [place] [address]    Allergies: Penicillins  PTA Medications:  (Not in a hospital admission)   Medical Decision Making    Recommendations  Based on my evaluation the patient does not appear to have an emergency medical condition.  Caroline Sauger, NP 11/13/20  2:59 AM

## 2020-11-13 NOTE — ED Notes (Signed)
Pt sleeping@this time. Breathing even and unlabored. Will continue to monitor for safety 

## 2020-11-13 NOTE — ED Notes (Signed)
Pt asleep with even and unlabored respirations. No distress or discomfort noted. Pt remains safe on the unit. Will continue to monitor. 

## 2020-11-13 NOTE — ED Notes (Signed)
Resting with eyes closed. Rise and fall of chest noted. No new issues noted. Will continue to monitor for safety

## 2020-11-13 NOTE — ED Notes (Signed)
Was giving lunch.

## 2020-11-13 NOTE — ED Notes (Signed)
PATIENT HAS NOT BELONGINGS IN STORAGE.

## 2020-11-13 NOTE — ED Notes (Signed)
Pt tried to walk away from medication window with meds in cup without taking them. Advised pt that she had to take them at the window. Pt took meds and walked quickly.

## 2020-11-13 NOTE — ED Provider Notes (Signed)
Behavioral Health Progress Note  Date and Time: 11/13/2020 1:54 PM Name: [name] MRN:  [id number]  Subjective: Patient states "I just want to be left alone to die."  Patient states "I did not mean that",I just want to be left alone to sleep."  Patient reports recent stressors include feeling like she is not herself.  Patient reports she is currently treated by primary care physician for anxiety as well as ADHD.  Patient reports her home medications include alprazolam, gabapentin and Adderall.  Patient resides in [place] with her 65 year old daughter and a roommate.  Patient denies access to weapons.  Patient reports she may have lost her job related to her mental health concerns but she is not certain.  Patient verbalizes concern that if she has been terminated she will no longer have health insurance.  Patient endorses marijuana use daily.  Patient denies substance use aside from marijuana.  Patient denies alcohol use.  Patient assessed by nurse practitioner.  Patient appears with depressed mood and labile affect.  Patient irritable at times states "why do I have to keep talking about myself?"  Patient appears to have paranoid ideations at times states "I do not know who I can trust."  Patient denies suicidal ideations.  Patient denies any history of suicide attempts or self-harm behaviors.  Patient denies homicidal ideations.  There is no evidence of delusional thought content and no indication patient is responding to internal stimuli.  Patient agrees with plan for inpatient psychiatric admission.  Patient offered support and encouragement. Diagnosis:  Final diagnoses:  Severe episode of recurrent major depressive disorder, with psychotic features ([place])    Total Time spent with patient: 30 minutes  Past Psychiatric History: Depression, generalized anxiety disorder, ADHD Past Medical History:  Past Medical History:  Diagnosis Date  . ADHD (attention deficit  hyperactivity disorder)   . Anxiety   . Depression   . Hx of staphylococcal septicemia     Past Surgical History:  Procedure Laterality Date  . ABDOMINAL HYSTERECTOMY    . BACK SURGERY    . left elbow surgery    . VIDEO BRONCHOSCOPY N/A 01/27/2013   Procedure: VIDEO BRONCHOSCOPY WITH FLUORO;  Surgeon: Elsie Stain, MD;  Location: [facility];  Service: Cardiopulmonary;  Laterality: N/A;   Family History: History reviewed. No pertinent family history. Family Psychiatric  History: None reported Social History:  Social History   Substance and Sexual Activity  Alcohol Use No     Social History   Substance and Sexual Activity  Drug Use No    Social History   Socioeconomic History  . Marital status: Single    Spouse name: Not on file  . Number of children: Not on file  . Years of education: Not on file  . Highest education level: Not on file  Occupational History  . Not on file  Tobacco Use  . Smoking status: Current Some Day Smoker    Packs/day: 1.00    Years: 20.00    Pack years: 20.00    Types: Cigarettes  . Smokeless tobacco: Never Used  Vaping Use  . Vaping Use: Never used  Substance and Sexual Activity  . Alcohol use: No  . Drug use: No  . Sexual activity: Not on file  Other Topics Concern  . Not on file  Social History Narrative  . Not on file   Social Determinants of Health   Financial Resource Strain:   . Difficulty of Paying Living Expenses: Not on  file  Food Insecurity:   . Worried About Charity fundraiser in the Last Year: Not on file  . Ran Out of Food in the Last Year: Not on file  Transportation Needs:   . Lack of Transportation (Medical): Not on file  . Lack of Transportation (Non-Medical): Not on file  Physical Activity:   . Days of Exercise per Week: Not on file  . Minutes of Exercise per Session: Not on file  Stress:   . Feeling of Stress : Not on file  Social Connections:   . Frequency of Communication with Friends and Family:  Not on file  . Frequency of Social Gatherings with Friends and Family: Not on file  . Attends Religious Services: Not on file  . Active Member of Clubs or Organizations: Not on file  . Attends Archivist Meetings: Not on file  . Marital Status: Not on file   SDOH:  SDOH Screenings   Alcohol Screen:   . Last Alcohol Screening Score (AUDIT): Not on file  Depression (PHQ2-9): Low Risk   . PHQ-2 Score: 0  Financial Resource Strain:   . Difficulty of Paying Living Expenses: Not on file  Food Insecurity:   . Worried About Charity fundraiser in the Last Year: Not on file  . Ran Out of Food in the Last Year: Not on file  Housing:   . Last Housing Risk Score: Not on file  Physical Activity:   . Days of Exercise per Week: Not on file  . Minutes of Exercise per Session: Not on file  Social Connections:   . Frequency of Communication with Friends and Family: Not on file  . Frequency of Social Gatherings with Friends and Family: Not on file  . Attends Religious Services: Not on file  . Active Member of Clubs or Organizations: Not on file  . Attends Archivist Meetings: Not on file  . Marital Status: Not on file  Stress:   . Feeling of Stress : Not on file  Tobacco Use: High Risk  . Smoking Tobacco Use: Current Some Day Smoker  . Smokeless Tobacco Use: Never Used  Transportation Needs:   . Film/video editor (Medical): Not on file  . Lack of Transportation (Non-Medical): Not on file   Additional Social History:    Pain Medications: see MAR Prescriptions: see MAR Over the Counter: see MAR History of alcohol / drug use?: Yes (Pt has history of using marijuana, benzodiazepines, and amphetamine.) Longest period of sobriety (when/how long): Pt denies any recent substance use                    Sleep: Good  Appetite:  Fair  Current Medications:  Current Facility-Administered Medications  Medication Dose Route Frequency Provider Last Rate Last Admin   . acetaminophen (TYLENOL) tablet 650 mg  650 mg Oral Q6H PRN Caroline Sauger, NP      . ALPRAZolam Duanne Moron) tablet 1 mg  1 mg Oral BID PRN Caroline Sauger, NP      . alum & mag hydroxide-simeth (MAALOX/MYLANTA) 200-200-20 MG/5ML suspension 30 mL  30 mL Oral Q4H PRN Caroline Sauger, NP      . magnesium hydroxide (MILK OF MAGNESIA) suspension 30 mL  30 mL Oral Daily PRN Caroline Sauger, NP       Current Outpatient Medications  Medication Sig Dispense Refill  . ALPRAZolam (XANAX) 1 MG tablet Take 1 mg by mouth 2 (two) times daily as needed.    Marland Kitchen  amphetamine-dextroamphetamine (ADDERALL) 20 MG tablet Take 20 mg by mouth 2 (two) times daily.    Marland Kitchen gabapentin (NEURONTIN) 800 MG tablet Take 800 mg by mouth 3 (three) times daily.      Labs  Lab Results:  Admission on 11/13/2020  Component Date Value Ref Range Status  . SARS Coronavirus 2 by RT PCR 11/13/2020 NEGATIVE  NEGATIVE Final   Comment: (NOTE) SARS-CoV-2 target nucleic acids are NOT DETECTED.  The SARS-CoV-2 RNA is generally detectable in upper respiratory specimens during the acute phase of infection. The lowest concentration of SARS-CoV-2 viral copies this assay can detect is 138 copies/mL. A negative result does not preclude SARS-Cov-2 infection and should not be used as the sole basis for treatment or other patient management decisions. A negative result may occur with  improper specimen collection/handling, submission of specimen other than nasopharyngeal swab, presence of viral mutation(s) within the areas targeted by this assay, and inadequate number of viral copies(<138 copies/mL). A negative result must be combined with clinical observations, patient history, and epidemiological information. The expected result is Negative.  Fact Sheet for Patients:  EntrepreneurPulse.com.au  Fact Sheet for Healthcare Providers:  IncredibleEmployment.be  This test is no                           t yet approved or cleared by the [place] FDA and  has been authorized for detection and/or diagnosis of SARS-CoV-2 by FDA under an Emergency Use Authorization (EUA). This EUA will remain  in effect (meaning this test can be used) for the duration of the COVID-19 declaration under Section 564(b)(1) of the Act, 21 U.S.C.section 360bbb-3(b)(1), unless the authorization is terminated  or revoked sooner.      . Influenza A by PCR 11/13/2020 NEGATIVE  NEGATIVE Final  . Influenza B by PCR 11/13/2020 NEGATIVE  NEGATIVE Final   Comment: (NOTE) The Xpert Xpress SARS-CoV-2/FLU/RSV plus assay is intended as an aid in the diagnosis of influenza from Nasopharyngeal swab specimens and should not be used as a sole basis for treatment. Nasal washings and aspirates are unacceptable for Xpert Xpress SARS-CoV-2/FLU/RSV testing.  Fact Sheet for Patients: EntrepreneurPulse.com.au  Fact Sheet for Healthcare Providers: IncredibleEmployment.be  This test is not yet approved or cleared by the [place] FDA and has been authorized for detection and/or diagnosis of SARS-CoV-2 by FDA under an Emergency Use Authorization (EUA). This EUA will remain in effect (meaning this test can be used) for the duration of the COVID-19 declaration under Section 564(b)(1) of the Act, 21 U.S.C. section 360bbb-3(b)(1), unless the authorization is terminated or revoked.  Performed at [facility], [place] [address], [place], [place] [address]   . SARS Coronavirus 2 Ag 11/13/2020 Negative  Negative Preliminary  . WBC 11/13/2020 9.8  4.0 - 10.5 K/uL Final  . RBC 11/13/2020 5.25* 3.87 - 5.11 MIL/uL Final  . Hemoglobin 11/13/2020 16.9* 12.0 - 15.0 g/dL Final  . HCT 11/13/2020 50.2* 36 - 46 % Final  . MCV 11/13/2020 95.6  80.0 - 100.0 fL Final  . MCH 11/13/2020 32.2  26.0 - 34.0 pg Final  . MCHC 11/13/2020 33.7  30.0 - 36.0 g/dL Final  . RDW 11/13/2020 12.5  11.5 -  15.5 % Final  . Platelets 11/13/2020 308  150 - 400 K/uL Final  . nRBC 11/13/2020 0.0  0.0 - 0.2 % Final  . Neutrophils Relative % 11/13/2020 61  % Final  . Neutro Abs 11/13/2020  6.0  1.7 - 7.7 K/uL Final  . Lymphocytes Relative 11/13/2020 31  % Final  . Lymphs Abs 11/13/2020 3.1  0.7 - 4.0 K/uL Final  . Monocytes Relative 11/13/2020 7  % Final  . Monocytes Absolute 11/13/2020 0.7  0.1 - 1.0 K/uL Final  . Eosinophils Relative 11/13/2020 0  % Final  . Eosinophils Absolute 11/13/2020 0.0  0.0 - 0.5 K/uL Final  . Basophils Relative 11/13/2020 1  % Final  . Basophils Absolute 11/13/2020 0.1  0.0 - 0.1 K/uL Final  . Immature Granulocytes 11/13/2020 0  % Final  . Abs Immature Granulocytes 11/13/2020 0.04  0.00 - 0.07 K/uL Final   Performed at [facility] [address] [place], [place] [address]  . Sodium 11/13/2020 138  135 - 145 mmol/L Final  . Potassium 11/13/2020 3.4* 3.5 - 5.1 mmol/L Final  . Chloride 11/13/2020 99  98 - 111 mmol/L Final  . CO2 11/13/2020 25  22 - 32 mmol/L Final  . Glucose, Bld 11/13/2020 87  70 - 99 mg/dL Final   Glucose reference range applies only to samples taken after fasting for at least 8 hours.  . BUN 11/13/2020 8  6 - 20 mg/dL Final  . Creatinine, Ser 11/13/2020 0.73  0.44 - 1.00 mg/dL Final  . Calcium 11/13/2020 10.4* 8.9 - 10.3 mg/dL Final  . Total Protein 11/13/2020 8.4* 6.5 - 8.1 g/dL Final  . Albumin 11/13/2020 4.9  3.5 - 5.0 g/dL Final  . AST 11/13/2020 20  15 - 41 U/L Final  . ALT 11/13/2020 17  0 - 44 U/L Final  . Alkaline Phosphatase 11/13/2020 96  38 - 126 U/L Final  . Total Bilirubin 11/13/2020 0.5  0.3 - 1.2 mg/dL Final  . GFR, Estimated 11/13/2020 >60  >60 mL/min Final   Comment: (NOTE) Calculated using the CKD-EPI Creatinine Equation (2021)   . Anion gap 11/13/2020 14  5 - 15 Final   Performed at [facility] [address] [place], [place] [address]  . Hgb A1c MFr Bld 11/13/2020 5.5  4.8 - 5.6 % Final   Comment:  (NOTE) Pre diabetes:          5.7%-6.4%  Diabetes:              >6.4%  Glycemic control for   <7.0% adults with diabetes   . Mean Plasma Glucose 11/13/2020 111.15  mg/dL Final   Performed at [facility] [address] [place], [place] [address]  . Magnesium 11/13/2020 1.7  1.7 - 2.4 mg/dL Final   Performed at [facility], [employer] [address], [place], [place] [address]  . Alcohol, Ethyl (B) 11/13/2020 <10  <10 mg/dL Final   Comment: (NOTE) Lowest detectable limit for serum alcohol is 10 mg/dL.  For medical purposes only. Performed at [facility], [place] [address] [place], [place] [address]   . TSH 11/13/2020 2.549  0.350 - 4.500 uIU/mL Final   Comment: Performed by a 3rd Generation assay with a functional sensitivity of <=0.01 uIU/mL. Performed at [facility], [place] [address], [place], [place] [address]   . POC Amphetamine UR 11/13/2020 None Detected  None Detected Preliminary  . POC Secobarbital (BAR) 11/13/2020 None Detected  None Detected Preliminary  . POC Buprenorphine (BUP) 11/13/2020 None Detected  None Detected Preliminary  . POC Oxazepam (BZO) 11/13/2020 Positive* None Detected Preliminary  . POC Cocaine UR 11/13/2020 None Detected  None Detected Preliminary  .  POC Methamphetamine UR 11/13/2020 None Detected  None Detected Preliminary  . POC Morphine 11/13/2020 None Detected  None Detected Preliminary  . POC Oxycodone UR 11/13/2020 None Detected  None Detected Preliminary  . POC Methadone UR 11/13/2020 None Detected  None Detected Preliminary  . POC Marijuana UR 11/13/2020 Positive* None Detected Preliminary  . Glucose-Capillary 11/13/2020 94  70 - 99 mg/dL Final   Glucose reference range applies only to samples taken after fasting for at least 8 hours.  . Cholesterol 11/13/2020 219* 0 - 200 mg/dL Final  . Triglycerides 11/13/2020 83  <150 mg/dL Final  . HDL 11/13/2020 86  >40 mg/dL Final  . Total CHOL/HDL Ratio 11/13/2020 2.5  RATIO Final  .  VLDL 11/13/2020 17  0 - 40 mg/dL Final  . LDL Cholesterol 11/13/2020 116* 0 - 99 mg/dL Final   Comment:        Total Cholesterol/HDL:CHD Risk Coronary Heart Disease Risk Table                     Men   Women  1/2 Average Risk   3.4   3.3  Average Risk       5.0   4.4  2 X Average Risk   9.6   7.1  3 X Average Risk  23.4   11.0        Use the calculated Patient Ratio above and the CHD Risk Table to determine the patient's CHD Risk.        ATP III CLASSIFICATION (LDL):  <100     mg/dL   Optimal  100-129  mg/dL   Near or Above                    Optimal  130-159  mg/dL   Borderline  160-189  mg/dL   High  >190     mg/dL   Very High Performed at [facility] [address], [place], [place] [address]     Blood Alcohol level:  Lab Results  Component Value Date   [facility] <10 11/13/2020   ETH <10 67/67/2094    Metabolic Disorder Labs: Lab Results  Component Value Date   HGBA1C 5.5 11/13/2020   MPG 111.15 11/13/2020   No results found for: PROLACTIN Lab Results  Component Value Date   CHOL 219 (H) 11/13/2020   TRIG 83 11/13/2020   HDL 86 11/13/2020   CHOLHDL 2.5 11/13/2020   VLDL 17 11/13/2020   LDLCALC 116 (H) 11/13/2020    Therapeutic Lab Levels: No results found for: LITHIUM No results found for: VALPROATE No components found for:  CBMZ  Physical Findings   PHQ2-9     ED from 11/13/2020 in [facility] Office Visit from 02/05/2018 in [facility]  PHQ-2 Total Score 0 0  PHQ-9 Total Score 0 --       Musculoskeletal  Strength & Muscle Tone: within normal limits Gait & Station: normal Patient leans: N/A  Psychiatric Specialty Exam  Presentation  General Appearance: Casual  Eye Contact:Fair  Speech:Clear and Coherent;Normal Rate  Speech Volume:Increased  Handedness:Right   Mood and Affect  Mood:Anxious;Irritable;Labile  Affect:Labile   Thought Process  Thought Processes:Coherent;Goal  Directed  Descriptions of Associations:Intact  Orientation:Full (Time, Place and Person)  Thought Content:Paranoid Ideation  Hallucinations:Hallucinations: None  Ideas of Reference:Paranoia  Suicidal Thoughts:Suicidal Thoughts: No  Homicidal Thoughts:Homicidal Thoughts: No   Sensorium  Memory:Immediate Fair;Recent Fair;Remote Fair  Judgment:Fair  Insight:Lacking   Executive Functions  Concentration:Fair  Attention Span:Fair  [place]  Language:Good   Psychomotor Activity  Psychomotor Activity:Psychomotor Activity: Normal   Assets  Assets:Communication Skills;Desire for Improvement;Financial Resources/Insurance;Housing;Intimacy;Physical Health;Leisure Time;Social Support   Sleep  Sleep:Sleep: Good   Physical Exam  Physical Exam Vitals and nursing note reviewed.  Constitutional:      Appearance: She is well-developed.  HENT:     Head: Normocephalic.  Cardiovascular:     Rate and Rhythm: Normal rate.  Pulmonary:     Effort: Pulmonary effort is normal.  Neurological:     Mental Status: She is alert and oriented to person, place, and time.  Psychiatric:        Attention and Perception: Attention normal.        Mood and Affect: Mood is anxious. Affect is labile.        Speech: Speech normal.        Behavior: Behavior is cooperative.        Thought Content: Thought content is paranoid.        Cognition and Memory: Cognition and memory normal.        Judgment: Judgment is inappropriate.    Review of Systems  Constitutional: Negative.   HENT: Negative.   Eyes: Negative.   Respiratory: Negative.   Cardiovascular: Negative.   Gastrointestinal: Negative.   Genitourinary: Negative.   Musculoskeletal: Negative.   Skin: Negative.   Neurological: Negative.   Endo/Heme/Allergies: Negative.   Psychiatric/Behavioral: Positive for substance abuse. The patient is nervous/anxious.    Blood pressure 110/84, pulse 71,  temperature 97.9 F (36.6 C), temperature source Oral, resp. rate 16, height 5' 5.35" (1.66 m), weight 105 lb (47.6 kg), SpO2 100 %. Body mass index is 17.28 kg/m.  Treatment Plan Summary: Daily contact with patient to assess and evaluate symptoms and progress in treatment and Plan inpatient psychiatric admission  Patient reviewed with Dr Hampton Abbot.   Medications: -Alprazolam 1 mg twice daily as needed -Gabapentin 800 mg 3 times daily  Emmaline Kluver, FNP 11/13/2020 1:54 PM

## 2020-11-14 ENCOUNTER — Other Ambulatory Visit: Payer: Self-pay

## 2020-11-14 ENCOUNTER — Encounter (HOSPITAL_COMMUNITY): Payer: Self-pay | Admitting: Psychiatry

## 2020-11-14 ENCOUNTER — Inpatient Hospital Stay (HOSPITAL_COMMUNITY)
Admission: AD | Admit: 2020-11-14 | Discharge: 2020-11-17 | DRG: 918 | Disposition: A | Discharge status: Home / Self Care | Payer: Medicare Other | Source: Intra-hospital | Attending: Psychiatry | Admitting: Psychiatry

## 2020-11-14 DIAGNOSIS — F23 Brief psychotic disorder: Secondary | ICD-10-CM | POA: Diagnosis present

## 2020-11-14 DIAGNOSIS — F411 Generalized anxiety disorder: Secondary | ICD-10-CM | POA: Diagnosis present

## 2020-11-14 DIAGNOSIS — Z91138 Patient's unintentional underdosing of medication regimen for other reason: Secondary | ICD-10-CM

## 2020-11-14 DIAGNOSIS — Z880 Allergy status to penicillin: Secondary | ICD-10-CM | POA: Diagnosis not present

## 2020-11-14 DIAGNOSIS — Z9151 Personal history of suicidal behavior: Secondary | ICD-10-CM | POA: Diagnosis not present

## 2020-11-14 DIAGNOSIS — Z23 Encounter for immunization: Secondary | ICD-10-CM | POA: Diagnosis present

## 2020-11-14 DIAGNOSIS — F909 Attention-deficit hyperactivity disorder, unspecified type: Secondary | ICD-10-CM | POA: Diagnosis present

## 2020-11-14 DIAGNOSIS — Z79899 Other long term (current) drug therapy: Secondary | ICD-10-CM

## 2020-11-14 DIAGNOSIS — T43621A Poisoning by amphetamines, accidental (unintentional), initial encounter: Principal | ICD-10-CM | POA: Diagnosis present

## 2020-11-14 DIAGNOSIS — F17210 Nicotine dependence, cigarettes, uncomplicated: Secondary | ICD-10-CM | POA: Diagnosis present

## 2020-11-14 DIAGNOSIS — F333 Major depressive disorder, recurrent, severe with psychotic symptoms: Secondary | ICD-10-CM | POA: Diagnosis present

## 2020-11-14 DIAGNOSIS — T43596A Underdosing of other antipsychotics and neuroleptics, initial encounter: Secondary | ICD-10-CM | POA: Diagnosis present

## 2020-11-14 DIAGNOSIS — F19950 Other psychoactive substance use, unspecified with psychoactive substance-induced psychotic disorder with delusions: Secondary | ICD-10-CM | POA: Diagnosis not present

## 2020-11-14 LAB — PROLACTIN: Prolactin: 19.2 ng/mL (ref 4.8–23.3)

## 2020-11-14 MED ORDER — ZIPRASIDONE MESYLATE 20 MG IM SOLR: 20.0000 mg | INTRAMUSCULAR | Status: DC | PRN

## 2020-11-14 MED ORDER — LORAZEPAM 1 MG PO TABS: 1.0000 mg | ORAL_TABLET | ORAL | Status: DC | PRN

## 2020-11-14 MED ORDER — INFLUENZA VAC SPLIT QUAD 0.5 ML IM SUSY
0.5000 mL | PREFILLED_SYRINGE | INTRAMUSCULAR | Status: AC
  Administered 2020-11-15: 0.5 mL via INTRAMUSCULAR
  Filled 2020-11-14: qty 0.5

## 2020-11-14 MED ORDER — NICOTINE 21 MG/24HR TD PT24
21.0000 mg | MEDICATED_PATCH | Freq: Every day | TRANSDERMAL | Status: DC
  Administered 2020-11-15 – 2020-11-17 (×3): 21 mg via TRANSDERMAL
  Filled 2020-11-14 (×6): qty 1

## 2020-11-14 MED ORDER — MAGNESIUM HYDROXIDE 400 MG/5ML PO SUSP: 30.0000 mL | Freq: Every day | ORAL | Status: DC | PRN

## 2020-11-14 MED ORDER — OLANZAPINE 5 MG PO TABS: 5.0000 mg | ORAL_TABLET | Freq: Every day | ORAL | Status: DC

## 2020-11-14 MED ORDER — OLANZAPINE 5 MG PO TABS
5.0000 mg | ORAL_TABLET | Freq: Every day | ORAL | Status: DC
  Administered 2020-11-14: 5 mg via ORAL
  Filled 2020-11-14: qty 2
  Filled 2020-11-14 (×3): qty 1

## 2020-11-14 MED ORDER — OLANZAPINE 5 MG PO TBDP: 5.0000 mg | ORAL_TABLET | Freq: Three times a day (TID) | ORAL | Status: DC | PRN

## 2020-11-14 MED ORDER — ALUM & MAG HYDROXIDE-SIMETH 200-200-20 MG/5ML PO SUSP: 30.0000 mL | ORAL | Status: DC | PRN

## 2020-11-14 MED ORDER — ALPRAZOLAM 0.5 MG PO TABS
1.0000 mg | ORAL_TABLET | Freq: Two times a day (BID) | ORAL | Status: DC | PRN
  Administered 2020-11-14 – 2020-11-16 (×4): 1 mg via ORAL
  Filled 2020-11-14 (×4): qty 2

## 2020-11-14 MED ORDER — GABAPENTIN 400 MG PO CAPS
800.0000 mg | ORAL_CAPSULE | Freq: Three times a day (TID) | ORAL | Status: DC
  Administered 2020-11-14 – 2020-11-17 (×8): 800 mg via ORAL
  Filled 2020-11-14 (×12): qty 2

## 2020-11-14 MED ORDER — ACETAMINOPHEN 325 MG PO TABS
650.0000 mg | ORAL_TABLET | Freq: Four times a day (QID) | ORAL | Status: DC | PRN
  Administered 2020-11-16: 650 mg via ORAL
  Filled 2020-11-14: qty 2

## 2020-11-14 NOTE — ED Notes (Signed)
Pt sleeping@this time. Breathing even and unlabored. Will continue to monitor for safety 

## 2020-11-14 NOTE — ED Notes (Signed)
Patient discharged.

## 2020-11-14 NOTE — Tx Team (Signed)
Initial Treatment Plan 11/14/2020 6:58 PM Trease Bremner Rohr ECX:[id number]    PATIENT STRESSORS: Financial difficulties Occupational concerns Other: Worrying about children   PATIENT STRENGTHS: General fund of knowledge Physical Health Supportive family/friends   PATIENT IDENTIFIED PROBLEMS: Depression  Paranoia  Anxiety    "Get my medicine back right"  "Get back on my schedule"           DISCHARGE CRITERIA:  Improved stabilization in mood, thinking, and/or behavior Reduction of life-threatening or endangering symptoms to within safe limits Verbal commitment to aftercare and medication compliance  PRELIMINARY DISCHARGE PLAN: Outpatient therapy  PATIENT/FAMILY INVOLVEMENT: This treatment plan has been presented to and reviewed with the patient, Kissie R Richner.  The patient and family have been given the opportunity to ask questions and make suggestions.  Windell Moment, RN 11/14/2020, 6:58 PM
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
## 2020-11-14 NOTE — ED Notes (Signed)
Pt resting with eyes closed. Rise and fall of chest noted. No new issues noted. Will continue to monitor for safety

## 2020-11-14 NOTE — Discharge Instructions (Signed)
Patient to be transferred to [facility]

## 2020-11-14 NOTE — Progress Notes (Signed)
Psychoeducational Group Note  Date:  11/14/2020 Time:  2101  Group Topic/Focus:  Wrap-Up Group:   The focus of this group is to help patients review their daily goal of treatment and discuss progress on daily workbooks.  Participation Level: Did Not Attend  Participation Quality:  Not Applicable  Affect:  Not Applicable  Cognitive:  Not Applicable  Insight:  Not Applicable  Engagement in Group: Not Applicable  Additional Comments:  The patient did not attend group this evening.   Archie Balboa S 11/14/2020, 9:01 PM

## 2020-11-14 NOTE — Progress Notes (Signed)
   11/14/20 2100  Psych Admission Type (Psych Patients Only)  Admission Status Voluntary  Psychosocial Assessment  Patient Complaints Depression  Eye Contact Brief  Facial Expression Sad  Affect Depressed  Speech Logical/coherent  Interaction Minimal  Motor Activity Slow  Appearance/Hygiene Disheveled  Behavior Characteristics Cooperative  Mood Anxious  Thought Process  Coherency WDL  Content WDL  Delusions None reported or observed  Perception WDL  Hallucination None reported or observed  Judgment WDL  Confusion None  Danger to Self  Current suicidal ideation? Denies  Self-Injurious Behavior No self-injurious ideation or behavior indicators observed or expressed   Agreement Not to Harm Self Yes  Description of Agreement verbal agreement  Danger to Others  Danger to Others None reported or observed

## 2020-11-14 NOTE — ED Notes (Signed)
Breakfast given- cereal, yogurt, juice

## 2020-11-14 NOTE — ED Provider Notes (Signed)
FBC/OBS ASAP Discharge Summary  Date and Time: 11/14/2020 12:26 PM  Name: [name]  MRN:  [id number]   Discharge Diagnoses:  Final diagnoses:  Severe episode of recurrent major depressive disorder, with psychotic features ([place])    Subjective: Patient interviewed this AM bedside. She is irritable and provides minimal responses to questions. She immediately inquires about discharge. She denies SI/HI/AVH. Pt provided minimal information around the circumstances of her presentation or the concerns of her children who accompanied her and appears paranoid. She stated that her reason for presentation was that she "wasn't eating or sleeping" but says she is doing both now that she has been in observation. When asked directly about paranoia she denied. She provides verbal consent for me to speak with son and daughter (see below), she says that she has spoken with her daughter a couple times since she has been here. After speaking with son and daughter, discussed their concerns with the patient. Pt became increasingly irritable and agitated when informed of the recommendation for inpatient hospitalization but remained agreeable for voluntary admission.   [name] (24 yo daughter) [phone] Called at 10:26 AM On thanksgiving she got home and pt wasn't there. She called her phone, not answered. Eventually came home and she wasn't really acting strange at first but then that night spent all night in her car with it running and the car in reverse. She and pt normally sleep in the same bed, knows when she is acting weird. The next morning when she went to check on the pt in the car, she said she was seeing people in the trees and that they were trying to shoot up the house and that they had to leave. Also pt kept saying that she couldn't talk on the phone because of people on the wifi. Pt is Not sleeping or eating, not taking medications. She called her brother because of things pt was saying. By the  time her brother arrived, pt had left and went to work. She showed up 4 hours late and was rolling around  in the parking lot. Last time she got psychiatric help something similar happened- she was rolling in the middle of the street talking to god. She and her brother got pt to come in voluntarily. Pt dx include psychosis, meds are zyprexa (supposed to take at night but hasnt because it makes her sleepy). Adderall, xanax, a medicine for depression (has not taken in years), gabapentin.They have spoken a couple times since she has been here but pt does not sound like her normal self. Feels that she needs hospitalzation. Has not voiced any paranoia to her over the phone yet. Today she sounded better than yeseterday bc she sounded extremely confused yesterday. She states she lives in the home with her mother and roommate who is mostly "not around"  [name] Ramer [phone] (son) Called at 10:34 AM I personally have noticed a personality change, not behavior as much, dont spend that much time around her. They speak often on the phone. Noticed about 3 weeks ago she started to "fall off a little bit". megan called me Friday morning and said mom sat in the driveway all night with the car on not going anywhere. By the time I got there mom left. She has been seeing people and hearing voices. She went to her job and her boss called me and said she was rolling around in the parking lot. Kept trying to call her and when she answered the phone just  kept saying "I don't know" when asked what was going on or how she feeling. Last hospitalization was sometime before covid but was similar. Feels she would benefit from inpatient admission. Raised concern that she may not go voluntarily and may have to be IVC'd, was IVC'd in the past.   Stay Summary: Patient presented to the [facility] on 11/28 accompanied by her 2 children for "Stress". Per children, patient has been paranoid recently. On initial presentation pt reported AH and  later appeared paranoid. Today patient is irritable and paranoid. Spoke to collateral (see above) who describe patient as not acting herself for several weeks which culminated in bizarre behavior and paranoia on Friday. She has spoken to her daughter since being admitted for observation and daughter feels that although she has improved, she is not at her baseline. Patient was restarted on home xanax, gabapentin. Adderall was held and zyprexa was reinitiated at dose of 5 mg qhs to start the night of 11/29.  Total Time spent with patient: 30 minutes  Past Psychiatric History: see H&P Past Medical History:  Past Medical History:  Diagnosis Date  . ADHD (attention deficit hyperactivity disorder)   . Anxiety   . Depression   . Hx of staphylococcal septicemia     Past Surgical History:  Procedure Laterality Date  . ABDOMINAL HYSTERECTOMY    . BACK SURGERY    . left elbow surgery    . VIDEO BRONCHOSCOPY N/A 01/27/2013   Procedure: VIDEO BRONCHOSCOPY WITH FLUORO;  Surgeon: Elsie Stain, MD;  Location: [facility];  Service: Cardiopulmonary;  Laterality: N/A;   Family History: History reviewed. No pertinent family history. Family Psychiatric History: see H&P Social History:  Social History   Substance and Sexual Activity  Alcohol Use No     Social History   Substance and Sexual Activity  Drug Use No    Social History   Socioeconomic History  . Marital status: Single    Spouse name: Not on file  . Number of children: Not on file  . Years of education: Not on file  . Highest education level: Not on file  Occupational History  . Not on file  Tobacco Use  . Smoking status: Current Some Day Smoker    Packs/day: 1.00    Years: 20.00    Pack years: 20.00    Types: Cigarettes  . Smokeless tobacco: Never Used  Vaping Use  . Vaping Use: Never used  Substance and Sexual Activity  . Alcohol use: No  . Drug use: No  . Sexual activity: Not on file  Other Topics Concern  . Not  on file  Social History Narrative  . Not on file   Social Determinants of Health   Financial Resource Strain:   . Difficulty of Paying Living Expenses: Not on file  Food Insecurity:   . Worried About Charity fundraiser in the Last Year: Not on file  . Ran Out of Food in the Last Year: Not on file  Transportation Needs:   . Lack of Transportation (Medical): Not on file  . Lack of Transportation (Non-Medical): Not on file  Physical Activity:   . Days of Exercise per Week: Not on file  . Minutes of Exercise per Session: Not on file  Stress:   . Feeling of Stress : Not on file  Social Connections:   . Frequency of Communication with Friends and Family: Not on file  . Frequency of Social Gatherings with Friends and Family: Not on  file  . Attends Religious Services: Not on file  . Active Member of Clubs or Organizations: Not on file  . Attends Archivist Meetings: Not on file  . Marital Status: Not on file   SDOH:  SDOH Screenings   Alcohol Screen:   . Last Alcohol Screening Score (AUDIT): Not on file  Depression (PHQ2-9): Low Risk   . PHQ-2 Score: 0  Financial Resource Strain:   . Difficulty of Paying Living Expenses: Not on file  Food Insecurity:   . Worried About Charity fundraiser in the Last Year: Not on file  . Ran Out of Food in the Last Year: Not on file  Housing:   . Last Housing Risk Score: Not on file  Physical Activity:   . Days of Exercise per Week: Not on file  . Minutes of Exercise per Session: Not on file  Social Connections:   . Frequency of Communication with Friends and Family: Not on file  . Frequency of Social Gatherings with Friends and Family: Not on file  . Attends Religious Services: Not on file  . Active Member of Clubs or Organizations: Not on file  . Attends Archivist Meetings: Not on file  . Marital Status: Not on file  Stress:   . Feeling of Stress : Not on file  Tobacco Use: High Risk  . Smoking Tobacco Use: Current  Some Day Smoker  . Smokeless Tobacco Use: Never Used  Transportation Needs:   . Film/video editor (Medical): Not on file  . Lack of Transportation (Non-Medical): Not on file    Has this patient used any form of tobacco in the last 30 days? (Cigarettes, Smokeless Tobacco, Cigars, and/or Pipes) Prescription not provided because: n/a  Current Medications:  Current Facility-Administered Medications  Medication Dose Route Frequency Provider Last Rate Last Admin  . acetaminophen (TYLENOL) tablet 650 mg  650 mg Oral Q6H PRN Caroline Sauger, NP      . ALPRAZolam Duanne Moron) tablet 1 mg  1 mg Oral BID PRN Caroline Sauger, NP   1 mg at 11/13/20 2015  . alum & mag hydroxide-simeth (MAALOX/MYLANTA) 200-200-20 MG/5ML suspension 30 mL  30 mL Oral Q4H PRN Caroline Sauger, NP      . magnesium hydroxide (MILK OF MAGNESIA) suspension 30 mL  30 mL Oral Daily PRN Caroline Sauger, NP       Current Outpatient Medications  Medication Sig Dispense Refill  . ALPRAZolam (XANAX) 1 MG tablet Take 1 mg by mouth 2 (two) times daily as needed.    Marland Kitchen amphetamine-dextroamphetamine (ADDERALL) 20 MG tablet Take 20 mg by mouth 2 (two) times daily.    Marland Kitchen gabapentin (NEURONTIN) 800 MG tablet Take 800 mg by mouth 3 (three) times daily.      PTA Medications: (Not in a hospital admission)   Musculoskeletal  Strength & Muscle Tone: within normal limits Gait & Station: normal Patient leans: N/A  Psychiatric Specialty Exam  Presentation  General Appearance: Casual;Disheveled  Eye Contact:Fair  Speech:Clear and Coherent;Other (comment) (generally normal rate, increased at times)  Speech Volume:Normal  Handedness:Right   Mood and Affect  Mood:Anxious;Irritable  Affect:Labile   Thought Process  Thought Processes:Linear  Descriptions of Associations:Intact  Orientation:Full (Time, Place and Person)  Thought Content:Paranoid Ideation  Hallucinations:Hallucinations: None  Ideas of  Reference:Paranoia  Suicidal Thoughts:Suicidal Thoughts: No  Homicidal Thoughts:Homicidal Thoughts: No   Sensorium  Memory:Immediate Good;Recent Fair  Judgment:Impaired  Insight:Lacking   Executive Functions  Concentration:Fair  Attention Span:Fair  [place]   Psychomotor Activity  Psychomotor Activity:Psychomotor Activity: Normal   Assets  Assets:Communication Skills;Desire for Improvement;Housing;Financial Resources/Insurance;Physical Health;Social Support   Sleep  Sleep:Sleep: Fair   Physical Exam  Physical Exam ROS Blood pressure 115/81, pulse 60, temperature 97.7 F (36.5 C), temperature source Oral, resp. rate 16, height 5' 5.35" (1.66 m), weight 47.6 kg, SpO2 97 %. Body mass index is 17.28 kg/m.  Demographic Factors:  Caucasian  Loss Factors: NA  Historical Factors: Impulsivity  Risk Reduction Factors:   Employed and Living with another person, especially a relative  Continued Clinical Symptoms:  Depression:   Comorbid alcohol abuse/dependence Currently Psychotic Previous Psychiatric Diagnoses and Treatments  Cognitive Features That Contribute To Risk:  Closed-mindedness    Suicide Risk:  Mild:  Suicidal ideation of limited frequency, intensity, duration, and specificity.  There are no identifiable plans, no associated intent, mild dysphoria and related symptoms, good self-control (both objective and subjective assessment), few other risk factors, and identifiable protective factors, including available and accessible social support.  Plan Of Care/Follow-up recommendations:  Activity:  as tolerated Diet:  regular Other:     Patient being transferred to [facility]. Restarted home medications and initiated zyprexa 5 mg qhs.  DispositionGershon Mussel cone [facility] 305-1  Ival Bible, MD 11/14/2020, 12:26 PM

## 2020-11-15 DIAGNOSIS — F19950 Other psychoactive substance use, unspecified with psychoactive substance-induced psychotic disorder with delusions: Secondary | ICD-10-CM

## 2020-11-15 MED ORDER — ARIPIPRAZOLE 2 MG PO TABS
2.0000 mg | ORAL_TABLET | Freq: Every day | ORAL | Status: DC
  Administered 2020-11-15: 2 mg via ORAL
  Filled 2020-11-15 (×3): qty 1

## 2020-11-15 MED ORDER — TRAZODONE HCL 50 MG PO TABS
50.0000 mg | ORAL_TABLET | Freq: Every evening | ORAL | Status: DC | PRN
  Administered 2020-11-16 (×2): 50 mg via ORAL
  Filled 2020-11-15 (×2): qty 1

## 2020-11-15 NOTE — Plan of Care (Signed)
Nurse discussed anxiety, depression and coping skills with patient.  

## 2020-11-15 NOTE — Progress Notes (Signed)
D:  Patient's self inventory sheet, patient sleeps good, no sleep medication.  Poor appetite, low energy level, poor concentration.  Denied depression and hopeless, rated anxiety 4.  Denied withdrawals.  Denied SI.  Denied physical problems.  Physical pain, back, pain med given.  Goal is eat more, work together with MD, discharge.  Plans to eat, ask for food.  Does have discharge plans. A:  Medications administered per MD orders.  Emotional support and encouragement given patient. R:  Denied SI and HI, contracts for safety.  Denied A/V hallucinations.  Safety maintained with 15 minute checks.

## 2020-11-15 NOTE — BHH Suicide Risk Assessment (Signed)
[facility] Admission Suicide Risk Assessment   Nursing information obtained from:  Patient Demographic factors:  Caucasian Current Mental Status:  NA Loss Factors:  Financial problems / change in socioeconomic status Historical Factors:  Prior suicide attempts Risk Reduction Factors:  Sense of responsibility to family, Living with another person, especially a relative  Total Time spent with patient: 20 minutes Principal Problem: Drug psychosis, with delusions ([place]) Diagnosis:  Principal Problem:   Drug psychosis, with delusions ([place]) Active Problems:   ADHD (attention deficit hyperactivity disorder)   GAD (generalized anxiety disorder)   MDD (major depressive disorder), recurrent, severe, with psychosis ([place])  Subjective Data: 49 year old woman with psychotic like behavior in context of Adderall use.   Continued Clinical Symptoms:  Alcohol Use Disorder Identification Test Final Score (AUDIT): 0 The "Alcohol Use Disorders Identification Test", Guidelines for Use in Primary Care, Second Edition.  World Pharmacologist [facility]). Score between 0-7:  no or low risk or alcohol related problems. Score between 8-15:  moderate risk of alcohol related problems. Score between 16-19:  high risk of alcohol related problems. Score 20 or above:  warrants further diagnostic evaluation for alcohol dependence and treatment.   CLINICAL FACTORS:   Alcohol/Substance Abuse/Dependencies  See HandP for full mental status exam  COGNITIVE FEATURES THAT CONTRIBUTE TO RISK:  Loss of executive function    SUICIDE RISK:   Moderate:  Frequent suicidal ideation with limited intensity, and duration, some specificity in terms of plans, no associated intent, good self-control, limited dysphoria/symptomatology, some risk factors present, and identifiable protective factors, including available and accessible social support.  PLAN OF CARE: Continue care on inpatient basis  I certify that inpatient services furnished  can reasonably be expected to improve the patient's condition.   Dixie Dials, MD 11/15/2020, 3:17 PM
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
## 2020-11-15 NOTE — Progress Notes (Signed)
Patient rated her day as a 6 out of 10 since she described it as being "in the middle". Her goal for tomorrow is to inquire about her discharge plans.

## 2020-11-15 NOTE — Progress Notes (Signed)
Adult Psychoeducational Group Note  Date:  11/15/2020 Time:  4:58 PM  Group Topic/Focus:  Coping With Mental Health Crisis:   The purpose of this group is to help patients identify strategies for coping with mental health crisis.  Group discusses possible causes of crisis and ways to manage them effectively.  Participation Level:  Active  Participation Quality:  Appropriate  Affect:  Appropriate  Cognitive:  Appropriate  Insight: Appropriate  Engagement in Group:  Engaged  Modes of Intervention:  Discussion  Additional Comments:  Pt attended group and participated in discussion.  Roshaunda Starkey R Minta Fair 11/15/2020, 4:58 PM

## 2020-11-15 NOTE — Progress Notes (Signed)
   11/15/20 2000  Psych Admission Type (Psych Patients Only)  Admission Status Voluntary  Psychosocial Assessment  Patient Complaints Anxiety;Depression  Eye Contact Brief  Facial Expression Sad  Affect Depressed  Speech Logical/coherent  Interaction Minimal  Motor Activity Slow  Appearance/Hygiene Disheveled  Behavior Characteristics Cooperative  Mood Depressed;Anxious  Thought Process  Coherency WDL  Content WDL  Delusions None reported or observed  Perception WDL  Hallucination None reported or observed  Judgment WDL  Confusion None  Danger to Self  Current suicidal ideation? Denies  Self-Injurious Behavior No self-injurious ideation or behavior indicators observed or expressed   Agreement Not to Harm Self Yes  Description of Agreement verbal agreement  Danger to Others  Danger to Others None reported or observed

## 2020-11-15 NOTE — Progress Notes (Signed)
Recreation Therapy Notes Animal-Assisted Activity (AAA) Program Checklist/Progress Notes Patient Eligibility Criteria Checklist & Daily Group note for Rec Tx Intervention  Date: 11/15/20 Time: 2:30pm Location: [address]  AAA/T Program Assumption of Risk Form signed by Patient/ or Legal Guardian Yes  Patient understands his/her participation is voluntary Yes  Patient declines activity intervnetion Yes   Behavioral Response: N/A   Education: Hand Washing, Appropriate Animal Interaction   Education Outcome: None   Clinical Observations/Feedback: Pt did not attend group session.     Fabiola Backer, LRT/CTRS  Bjorn Loser Margery Szostak 11/15/2020 4:09 PM
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
## 2020-11-16 MED ORDER — ENSURE ENLIVE PO LIQD
237.0000 mL | Freq: Two times a day (BID) | ORAL | Status: DC
  Administered 2020-11-16 (×2): 237 mL via ORAL
  Filled 2020-11-16 (×5): qty 237

## 2020-11-16 MED ORDER — ARIPIPRAZOLE 5 MG PO TABS
5.0000 mg | ORAL_TABLET | Freq: Every day | ORAL | Status: DC
  Administered 2020-11-16: 5 mg via ORAL
  Filled 2020-11-16 (×2): qty 1

## 2020-11-16 NOTE — Progress Notes (Signed)
Adult Psychoeducational Group Note  Date:  11/16/2020 Time:  9:22 PM  Group Topic/Focus:  Wrap-Up Group:   The focus of this group is to help patients review their daily goal of treatment and discuss progress on daily workbooks.  Participation Level:  Active  Participation Quality:  Appropriate  Affect:  Appropriate  Cognitive:  Appropriate  Insight: Appropriate  Engagement in Group:  Engaged  Modes of Intervention:  Discussion  Additional Comments:  Pt attend wrap up group. Her day was a 9. The one positive thing that happened to her she showered, ate. The is was her goal self maintance.  [name] 11/16/2020, 9:22 PM

## 2020-11-16 NOTE — Tx Team (Signed)
Interdisciplinary Treatment and Diagnostic Plan Update  11/16/2020 Time of Session: 9:05am [name] MRN: [id number]  Principal Diagnosis: Drug psychosis, with delusions ([place])  Secondary Diagnoses: Principal Problem:   Drug psychosis, with delusions ([place]) Active Problems:   ADHD (attention deficit hyperactivity disorder)   GAD (generalized anxiety disorder)   MDD (major depressive disorder), recurrent, severe, with psychosis ([place])   Current Medications:  Current Facility-Administered Medications  Medication Dose Route Frequency Provider Last Rate Last Admin  . acetaminophen (TYLENOL) tablet 650 mg  650 mg Oral Q6H PRN Ival Bible, MD   650 mg at 11/16/20 0801  . ALPRAZolam Duanne Moron) tablet 1 mg  1 mg Oral BID PRN Ival Bible, MD   1 mg at 11/16/20 0801  . alum & mag hydroxide-simeth (MAALOX/MYLANTA) 200-200-20 MG/5ML suspension 30 mL  30 mL Oral Q4H PRN Ival Bible, MD      . ARIPiprazole (ABILIFY) tablet 2 mg  2 mg Oral QHS Cristofano, Dorene Ar, MD   2 mg at 11/15/20 2102  . feeding supplement (ENSURE ENLIVE / ENSURE PLUS) liquid 237 mL  237 mL Oral BID BM Sharma Covert, MD   237 mL at 11/16/20 1041  . gabapentin (NEURONTIN) capsule 800 mg  800 mg Oral TID Ival Bible, MD   800 mg at 11/16/20 0801  . OLANZapine zydis (ZYPREXA) disintegrating tablet 5 mg  5 mg Oral Q8H PRN Ival Bible, MD       And  . LORazepam (ATIVAN) tablet 1 mg  1 mg Oral PRN Ival Bible, MD       And  . ziprasidone (GEODON) injection 20 mg  20 mg Intramuscular PRN Ival Bible, MD      . magnesium hydroxide (MILK OF MAGNESIA) suspension 30 mL  30 mL Oral Daily PRN Ival Bible, MD      . nicotine (NICODERM CQ - dosed in mg/24 hours) patch 21 mg  21 mg Transdermal Daily Sharma Covert, MD   21 mg at 11/16/20 0804  . traZODone (DESYREL) tablet 50 mg  50 mg Oral QHS PRN,MR X 1 Lindon Romp A, NP   50 mg at 11/16/20 0127   PTA  Medications: Medications Prior to Admission  Medication Sig Dispense Refill Last Dose  . ALPRAZolam (XANAX) 1 MG tablet Take 1 mg by mouth 2 (two) times daily as needed.     Marland Kitchen amphetamine-dextroamphetamine (ADDERALL) 20 MG tablet Take 20 mg by mouth 2 (two) times daily.     Marland Kitchen gabapentin (NEURONTIN) 800 MG tablet Take 800 mg by mouth 3 (three) times daily.       Patient Stressors: Financial difficulties Occupational concerns Other: Worrying about children  Patient Strengths: General fund of knowledge Physical Health Supportive family/friends  Treatment Modalities: Medication Management, Group therapy, Case management,  1 to 1 session with clinician, Psychoeducation, Recreational therapy.   Physician Treatment Plan for Primary Diagnosis: Drug psychosis, with delusions ([place]) Long Term Goal(s): Improvement in symptoms so as ready for discharge Improvement in symptoms so as ready for discharge   Short Term Goals: Ability to identify changes in lifestyle to reduce recurrence of condition will improve Ability to verbalize feelings will improve Ability to disclose and discuss suicidal ideas Ability to identify and develop effective coping behaviors will improve Compliance with prescribed medications will improve Ability to identify triggers associated with substance abuse/mental health issues will improve Ability to identify changes in lifestyle to reduce recurrence of condition will improve  Ability to verbalize feelings will improve Ability to disclose and discuss suicidal ideas Ability to identify and develop effective coping behaviors will improve Compliance with prescribed medications will improve Ability to identify triggers associated with substance abuse/mental health issues will improve  Medication Management: Evaluate patient's response, side effects, and tolerance of medication regimen.  Therapeutic Interventions: 1 to 1 sessions, Unit Group sessions and Medication  administration.  Evaluation of Outcomes: Not Met  Physician Treatment Plan for Secondary Diagnosis: Principal Problem:   Drug psychosis, with delusions ([place]) Active Problems:   ADHD (attention deficit hyperactivity disorder)   GAD (generalized anxiety disorder)   MDD (major depressive disorder), recurrent, severe, with psychosis ([place])  Long Term Goal(s): Improvement in symptoms so as ready for discharge Improvement in symptoms so as ready for discharge   Short Term Goals: Ability to identify changes in lifestyle to reduce recurrence of condition will improve Ability to verbalize feelings will improve Ability to disclose and discuss suicidal ideas Ability to identify and develop effective coping behaviors will improve Compliance with prescribed medications will improve Ability to identify triggers associated with substance abuse/mental health issues will improve Ability to identify changes in lifestyle to reduce recurrence of condition will improve Ability to verbalize feelings will improve Ability to disclose and discuss suicidal ideas Ability to identify and develop effective coping behaviors will improve Compliance with prescribed medications will improve Ability to identify triggers associated with substance abuse/mental health issues will improve     Medication Management: Evaluate patient's response, side effects, and tolerance of medication regimen.  Therapeutic Interventions: 1 to 1 sessions, Unit Group sessions and Medication administration.  Evaluation of Outcomes: Not Met   RN Treatment Plan for Primary Diagnosis: Drug psychosis, with delusions ([place]) Long Term Goal(s): Knowledge of disease and therapeutic regimen to maintain health will improve  Short Term Goals: Ability to remain free from injury will improve, Ability to demonstrate self-control, Ability to participate in decision making will improve, Ability to verbalize feelings will improve, Ability to disclose and  discuss suicidal ideas and Ability to identify and develop effective coping behaviors will improve  Medication Management: RN will administer medications as ordered by provider, will assess and evaluate patient's response and provide education to patient for prescribed medication. RN will report any adverse and/or side effects to prescribing provider.  Therapeutic Interventions: 1 on 1 counseling sessions, Psychoeducation, Medication administration, Evaluate responses to treatment, Monitor vital signs and CBGs as ordered, Perform/monitor CIWA, COWS, AIMS and Fall Risk screenings as ordered, Perform wound care treatments as ordered.  Evaluation of Outcomes: Not Met   LCSW Treatment Plan for Primary Diagnosis: Drug psychosis, with delusions ([place]) Long Term Goal(s): Safe transition to appropriate next level of care at discharge, Engage patient in therapeutic group addressing interpersonal concerns.  Short Term Goals: Engage patient in aftercare planning with referrals and resources, Increase social support, Increase emotional regulation, Facilitate patient progression through stages of change regarding substance use diagnoses and concerns, Identify triggers associated with mental health/substance abuse issues and Increase skills for wellness and recovery  Therapeutic Interventions: Assess for all discharge needs, 1 to 1 time with Social worker, Explore available resources and support systems, Assess for adequacy in community support network, Educate family and significant other(s) on suicide prevention, Complete Psychosocial Assessment, Interpersonal group therapy.  Evaluation of Outcomes: Not Met   Progress in Treatment: Attending groups: Yes. Participating in groups: Yes. Taking medication as prescribed: Yes. Toleration medication: Yes. Family/Significant other contact made: Yes, individual(s) contacted:  Daughter and Son  Patient understands diagnosis: Yes. and No. Discussing patient  identified problems/goals with staff: Yes. Medical problems stabilized or resolved: Yes. Denies suicidal/homicidal ideation: Yes. Issues/concerns per patient self-inventory: No.   New problem(s) identified: No, Describe:  None   New Short Term/Long Term Goal(s): medication stabilization, elimination of SI thoughts, development of comprehensive mental wellness plan.   Patient Goals:  Did not attend   Discharge Plan or Barriers: Patient recently admitted. CSW will continue to follow and assess for appropriate referrals and possible discharge planning.   Reason for Continuation of Hospitalization: Anxiety Medication stabilization Suicidal ideation  Estimated Length of Stay: 3 to 5 days  Attendees: Patient: Did not attend  11/16/2020   Physician: Lala Lund, MD 11/16/2020   Nursing:  11/16/2020   RN Care Manager: 11/16/2020   Social Worker: Verdis Frederickson, [place] 11/16/2020   Recreational Therapist:  11/16/2020   Other:  11/16/2020   Other:  11/16/2020   Other: 11/16/2020     Scribe for Treatment Team: Darleen Crocker, [place] [address] [place] 11/16/2020 10:43 AM

## 2020-11-16 NOTE — Progress Notes (Signed)
Pt stated she was feeling a lot better, pt visibly in the dayroom interacting with peers    11/16/20 2100  Psych Admission Type (Psych Patients Only)  Admission Status Voluntary  Psychosocial Assessment  Patient Complaints Depression  Eye Contact Fair  Facial Expression Sad  Affect Depressed  Speech Logical/coherent  Interaction Assertive  Motor Activity Slow  Appearance/Hygiene Improved  Behavior Characteristics Cooperative  Mood Depressed;Pleasant  Thought Process  Coherency WDL  Content WDL  Delusions None reported or observed  Perception WDL  Hallucination None reported or observed  Judgment WDL  Confusion None  Danger to Self  Current suicidal ideation? Denies  Danger to Others  Danger to Others None reported or observed

## 2020-11-16 NOTE — Progress Notes (Signed)
[facility] Progress Note  11/16/2020 7:54 AM [name]  MRN:  [id number] Subjective:  [name] is a 49 year old female who presents for drug induced psychosis due to frequent Adderall usage. PPHx includes MDD, GAD, and ADHD.  Pt reports improved appetite today having eaten most of her breakfast this morning. She reports some sleep difficulties last night, tossing and turning. She states that some of this may have to do with not being at home. She notes that she does not have her denture glue and not being able to use her dentures is causing her some increased anxiety and uneasiness. Denies SI/HI and AVH. Discussed her attendance at group sessions yesterday, she mentioned that she enjoyed them and plans to go to the sessions this afternoon as well. She does report a HA this morning and notes that prior to her admission she used BC powder daily but has not since [facility] to the [facility]. Received Tylenol and it is improving slightly.  Confirms that for several years she has been taking 2 Adderall on work days which is typically 2-5 days of the week. Did not ask for or request Adderall during morning interview.  She reports that overall her mood and outlook are relatively unchanged since yesterday.  Principal Problem: Drug psychosis, with delusions ([place]) Diagnosis: Principal Problem:   Drug psychosis, with delusions ([place]) Active Problems:   ADHD (attention deficit hyperactivity disorder)   GAD (generalized anxiety disorder)   MDD (major depressive disorder), recurrent, severe, with psychosis ([place])  Total Time spent with patient: 15 minutes  Past Psychiatric History: MDD, recurrent, severe, with psychosis; ADHD, GAD  Past Medical History:  Past Medical History:  Diagnosis Date  . ADHD (attention deficit hyperactivity disorder)   . Anxiety   . Depression   . Hx of staphylococcal septicemia     Past Surgical History:  Procedure Laterality Date  . ABDOMINAL HYSTERECTOMY    . BACK SURGERY     . left elbow surgery    . VIDEO BRONCHOSCOPY N/A 01/27/2013   Procedure: VIDEO BRONCHOSCOPY WITH FLUORO;  Surgeon: Elsie Stain, MD;  Location: [place] [facility];  Service: Cardiopulmonary;  Laterality: N/A;   Family History: History reviewed. No pertinent family history. Family Psychiatric  History: Unclear based on pt report, "Mother had everything." but unable to provide details Social History:  Social History   Substance and Sexual Activity  Alcohol Use No     Social History   Substance and Sexual Activity  Drug Use No    Social History   Socioeconomic History  . Marital status: Single    Spouse name: Not on file  . Number of children: Not on file  . Years of education: Not on file  . Highest education level: Not on file  Occupational History  . Not on file  Tobacco Use  . Smoking status: Current Some Day Smoker    Packs/day: 1.00    Years: 20.00    Pack years: 20.00    Types: Cigarettes  . Smokeless tobacco: Never Used  Vaping Use  . Vaping Use: Never used  Substance and Sexual Activity  . Alcohol use: No  . Drug use: No  . Sexual activity: Not on file  Other Topics Concern  . Not on file  Social History Narrative  . Not on file   Social Determinants of Health   Financial Resource Strain:   . Difficulty of Paying Living Expenses: Not on file  Food Insecurity:   . Worried  About Running Out of Food in the Last Year: Not on file  . Ran Out of Food in the Last Year: Not on file  Transportation Needs:   . Lack of Transportation (Medical): Not on file  . Lack of Transportation (Non-Medical): Not on file  Physical Activity:   . Days of Exercise per Week: Not on file  . Minutes of Exercise per Session: Not on file  Stress:   . Feeling of Stress : Not on file  Social Connections:   . Frequency of Communication with Friends and Family: Not on file  . Frequency of Social Gatherings with Friends and Family: Not on file  . Attends Religious Services: Not on file   . Active Member of Clubs or Organizations: Not on file  . Attends Archivist Meetings: Not on file  . Marital Status: Not on file   Additional Social History:                         Sleep: Poor  Appetite:  Fair  Current Medications: Current Facility-Administered Medications  Medication Dose Route Frequency Provider Last Rate Last Admin  . acetaminophen (TYLENOL) tablet 650 mg  650 mg Oral Q6H PRN Ival Bible, MD      . ALPRAZolam Duanne Moron) tablet 1 mg  1 mg Oral BID PRN Ival Bible, MD   1 mg at 11/15/20 1734  . alum & mag hydroxide-simeth (MAALOX/MYLANTA) 200-200-20 MG/5ML suspension 30 mL  30 mL Oral Q4H PRN Ival Bible, MD      . ARIPiprazole (ABILIFY) tablet 2 mg  2 mg Oral QHS Shenetta Schnackenberg, Dorene Ar, MD   2 mg at 11/15/20 2102  . gabapentin (NEURONTIN) capsule 800 mg  800 mg Oral TID Ival Bible, MD   800 mg at 11/15/20 1732  . OLANZapine zydis (ZYPREXA) disintegrating tablet 5 mg  5 mg Oral Q8H PRN Ival Bible, MD       And  . LORazepam (ATIVAN) tablet 1 mg  1 mg Oral PRN Ival Bible, MD       And  . ziprasidone (GEODON) injection 20 mg  20 mg Intramuscular PRN Ival Bible, MD      . magnesium hydroxide (MILK OF MAGNESIA) suspension 30 mL  30 mL Oral Daily PRN Ival Bible, MD      . nicotine (NICODERM CQ - dosed in mg/24 hours) patch 21 mg  21 mg Transdermal Daily Sharma Covert, MD   21 mg at 11/15/20 0758  . traZODone (DESYREL) tablet 50 mg  50 mg Oral QHS PRN,MR X 1 Lindon Romp A, NP   50 mg at 11/16/20 0127    Lab Results: No results found for this or any previous visit (from the past 23 hour(s)).  Blood Alcohol level:  Lab Results  Component Value Date   ETH <10 11/13/2020   ETH <10 67/20/9470    Metabolic Disorder Labs: Lab Results  Component Value Date   HGBA1C 5.5 11/13/2020   MPG 111.15 11/13/2020   Lab Results  Component Value Date   PROLACTIN 19.2 11/13/2020    Lab Results  Component Value Date   CHOL 219 (H) 11/13/2020   TRIG 83 11/13/2020   HDL 86 11/13/2020   CHOLHDL 2.5 11/13/2020   VLDL 17 11/13/2020   LDLCALC 116 (H) 11/13/2020    Physical Findings: AIMS: Facial and Oral Movements Muscles of Facial Expression: None, normal Lips and Perioral  Area: None, normal Jaw: None, normal Tongue: None, normal,Extremity Movements Upper (arms, wrists, hands, fingers): None, normal Lower (legs, knees, ankles, toes): None, normal, Trunk Movements Neck, shoulders, hips: None, normal, Overall Severity Severity of abnormal movements (highest score from questions above): None, normal Incapacitation due to abnormal movements: None, normal Patient's awareness of abnormal movements (rate only patient's report): No Awareness, Dental Status Current problems with teeth and/or dentures?: No Does patient usually wear dentures?: No  CIWA:    COWS:     Musculoskeletal: Strength & Muscle Tone: within normal limits Gait & Station: normal Patient leans: N/A  Psychiatric Specialty Exam: Physical Exam Constitutional:      Appearance: Normal appearance.  HENT:     Head: Normocephalic and atraumatic.  Pulmonary:     Effort: Pulmonary effort is normal.  Neurological:     General: No focal deficit present.     Mental Status: She is alert and oriented to person, place, and time.     Review of Systems  Constitutional: Positive for appetite change (Improved) and fatigue.  Respiratory: Negative for chest tightness and shortness of breath.   Cardiovascular: Negative for chest pain, palpitations and leg swelling.  Gastrointestinal: Negative for constipation and diarrhea.  Musculoskeletal: Negative for arthralgias and myalgias.  Psychiatric/Behavioral: Negative for agitation, confusion, hallucinations and self-injury. The patient is not hyperactive.     Blood pressure 112/85, pulse 91, temperature 98.2 F (36.8 C), temperature source Oral, resp. rate 18,  height 5\' 5"  (1.651 m), weight 47.6 kg, SpO2 98 %.Body mass index is 17.47 kg/m.  General Appearance: Casual and Fairly Groomed  Eye Contact:  Minimal  Speech:  Clear and Coherent and Normal Rate  Volume:  Decreased  Mood:  Dysphoric  Affect:  Congruent and Depressed  Thought Process:  Coherent and Linear  Orientation:  Full (Time, Place, and Person)  Thought Content:  WDL and Logical  Suicidal Thoughts:  No  Homicidal Thoughts:  No  Memory:  Immediate;   Fair Recent;   Fair  Judgement:  Intact  Insight:  Good  Psychomotor Activity:  Negative  Concentration:  Concentration: Good  Recall:  NA  Fund of Knowledge:  Good  Language:  Good  Akathisia:  No  Handed:  Right  AIMS (if indicated):     Assets:  Housing Social Support Talents/Skills  ADL's:  Intact  Cognition:  WNL  Sleep:  Number of Hours: 6.75     Treatment Plan Summary: Daily contact with patient to assess and evaluate symptoms and progress in treatment and medication management. Pt reports little to no improvement in her mood or outlook though she has experienced improved appetite this AM. Will increase her dose of Abilify tonight and continue to watch for improvement. Continue to assess for signs of psychosis. Encouraged continued attendance and engagement in group activities.  -Increase Abilify to 5 mg qHS this PM -Denture glue to be provided as needed  Fulton Reek, Medical Student 11/16/2020, 7:54 AM

## 2020-11-16 NOTE — Plan of Care (Signed)
  Problem: Education: Goal: Emotional status will improve 11/16/2020 1238 by Windell Moment, RN Outcome: Progressing 11/16/2020 1151 by Windell Moment, RN Outcome: Progressing   Problem: Education: Goal: Mental status will improve 11/16/2020 1238 by Windell Moment, RN Outcome: Progressing 11/16/2020 1151 by Windell Moment, RN Outcome: Progressing

## 2020-11-16 NOTE — Plan of Care (Deleted)
[name] reported this morning that she was feeling better.  Depression and anxiety have decreased.  She denied SI/HI and has been attending groups.

## 2020-11-16 NOTE — Progress Notes (Signed)
A:  [name] has been more visible on the hall today.  She was pleasant and cooperative.  Alert and oriented x 3.  She denied SI/HI or A/V hallucinations. She has been attending groups.  Minimal interaction with peers.  She completed her self inventory and reported her Depression 5/10, Anxiety 0/10 and hopelessness 0/10 (10 the worst).  She reported her goal for today is "going home."  She will achieve this goal by "try."  This morning after receiving her Ensure, her affect was brighter.  She stated that she was feeling much better.  She was appreciative of the care that has been provided.  PRN Xanax given for anxiety and Tylenol given for headache with good relief.   A:  1:1 with RN for support and encouragement.  Medications as ordered and prn.  Q 15 minute checks maintained for safety.  Encouraged participation in group and unit activities.   R:  She remains safe on the unit.  We will continue to monitor the progress towards her goals.

## 2020-11-16 NOTE — Progress Notes (Signed)
Recreation Therapy Notes  Date: 11/16/20 Time: 930a Location: [address] Dayroom  Group Topic: Stress Management  Goal Area(s) Addresses:  Patient will actively participate in stress management techniques presented during session.   Behavioral Response: Engaged, Appropriate  Intervention: Stress Management/Guided Imagery  Activity:  Guided Imagery Relaxation Script incorporating body scan and deep breathing techniques; LRT read a relaxation script highlighting visuals associated with the beach at sunrise and sounds of gentle waves. Writer played gentle sounds from a beach scene quietly in the background to enhance activity experience. At conclusion of the 15 minute relaxation exercise, LRT reviewed other ways to manage stress via mindfulness and use of free smart phones apps addressing needs post discharge.  Education:  Stress Management, Discharge Planning  Education Outcome: Acknowledges education  Clinical Observations/Feedback: Pt attended group session and was attentive throughout relaxation activity and post discussion.  Endorsed positive experience as a result of participation. Pt reported understanding of benefits associated with short, simple relaxation efforts when experiencing negative emotions during daily routines. Receptive to resources reviewed by Probation officer.   Nunzio Cory Trinton Prewitt, LRT/CTRS Bjorn Loser Idalys Konecny 11/16/2020, 10:18 AM

## 2020-11-16 NOTE — [facility] Group Notes (Signed)
The Focus of this group is helping patients become aware of the effects of self-esteem on their lives, the things they and others do that enhance or undermine their self-esteem, seeing the relationship between their level of self-esteem and the choices they make and learning ways to enhance self-esteem.   Patient attended group and contributed to group setting. Patient was very much engaged in group.

## 2020-11-16 NOTE — Progress Notes (Signed)
NUTRITION ASSESSMENT RD working remotely.  Pt identified as at risk on the Malnutrition Screen Tool  INTERVENTION: - will order Ensure Enlive BID, each supplement provides 350 kcal and 20 grams of protein.  NUTRITION DIAGNOSIS: Unintentional weight loss related to sub-optimal intake as evidenced by pt report.   Goal: Pt to meet >/= 90% of their estimated nutrition needs.  Monitor:  PO intake  Assessment:  Patient admitted with recurrent and severe MDD, drug-induced psychosis with delusions.   Patient reported to [facility] staff that she has been feeling tired and anxious. She denied SI to staff.   [facility] staff had talked with patient's children who reported that patient was having auditory and visual hallucinations, paranoia, and that they had been made aware by patient's employer that patient has been acting strangely.   Weight on 11/29 was 105 lb, which appears to be a stated weight. PTA, the most recently documented weight was on 06/17/19 when she weighed 125 lb. This would indicate 20 lb weight loss (16% body weight); not significant for 16 month time frame but unsure if weight loss may have occurred more acutely.   49 y.o. female  Height: Ht Readings from Last 1 Encounters:  11/14/20 5\' 5"  (1.651 m)    Weight: Wt Readings from Last 1 Encounters:  11/14/20 47.6 kg    Weight Hx: Wt Readings from Last 10 Encounters:  11/14/20 47.6 kg  11/13/20 47.6 kg  06/17/19 56.7 kg  02/05/18 60.9 kg  02/05/18 60.3 kg  02/01/18 62.4 kg  01/30/18 59 kg  06/18/16 57.2 kg  05/30/14 57.2 kg  05/05/14 54.4 kg    BMI:  Body mass index is 17.47 kg/m. Pt meets criteria for underweight based on current BMI.  Estimated Nutritional Needs: Kcal: 25-30 kcal/kg Protein: > 1 gram protein/kg Fluid: 1 ml/kcal  Diet Order:  Diet Order            Diet regular Room service appropriate? Yes; Fluid consistency: Thin  Diet effective now                Pt is also offered choice of unit  snacks mid-morning and mid-afternoon.  Pt is eating as desired.   Lab results and medications reviewed.       Jarome Matin, MS, RD, LDN, CNSC Inpatient Clinical Dietitian RD pager # available in [place]  After hours/weekend pager # available in [facility]

## 2020-11-16 NOTE — BHH Group Notes (Signed)
[facility] LCSW Group Therapy  11/16/2020 1:32 PM  Type of Therapy:  Group Therapy :Coping Skills: The topic of today's group was coping skills. Pts were given a handout with a list of 99 coping skills and encouraged to try some from the sheet. Discussion was had on what coping skills are, what they do and their purpose. Pts were asked to share how they cope with stress.  Participation Level:  Active  Summary of Progress/Problems: Pt accepted handout. Pt shared that she likes to meditate and reach out to family to deal with stress. Pt shared that she would like to have someone to talk to about her problem when she discharges.   Mliss Fritz 11/16/2020, 1:32 PM

## 2020-11-17 MED ORDER — ARIPIPRAZOLE 5 MG PO TABS: 5.0000 mg | ORAL_TABLET | Freq: Every day | ORAL | 0 refills | Status: DC

## 2020-11-17 MED ORDER — NICOTINE 14 MG/24HR TD PT24: 14.0000 mg | MEDICATED_PATCH | TRANSDERMAL | 0 refills | Status: AC

## 2020-11-17 NOTE — Progress Notes (Signed)
  [facility] Adult Case Management Discharge Plan :  Will you be returning to the same living situation after discharge:  Yes,  personal home.  At discharge, do you have transportation home?: Yes,  via daughter Do you have the ability to pay for your medications: Yes,  pt has income  Release of information consent forms completed and in the chart;  Patient's signature needed at discharge.  Patient to Follow up at:  Follow-up [place], [facility] Follow up on 12/14/2020.   Specialty: Behavioral Health Why: You have an appointment for medication management with Dr. Pearson Grippe on 12/14/20 at 2pm over the phone. This will be a tele-health appointment.  Contact information: [address] [place] [address] [phone]        [facility] Follow up on 11/23/2020.   Specialty: Urgent Care Why: You have a walk-in appointment for therapy at 7:45am. This is a walk-in appointment. Contact information: [place] [phone]              Next level of care provider has access to [place] and Suicide Prevention discussed: Yes,   [name] (daughter) [phone]  Have you used any form of tobacco in the last 30 days? (Cigarettes, Smokeless Tobacco, Cigars, and/or Pipes): Yes  Has patient been referred to the Quitline?: Patient refused referral  Patient has been referred for addiction treatment: [place], Latanya Presser 11/17/2020, 9:13 AM

## 2020-11-17 NOTE — BHH Suicide Risk Assessment (Signed)
[facility] Discharge Suicide Risk Assessment   Principal Problem: Drug psychosis, with delusions ([place]) Discharge Diagnoses: Principal Problem:   Drug psychosis, with delusions ([place]) Active Problems:   ADHD (attention deficit hyperactivity disorder)   GAD (generalized anxiety disorder)   MDD (major depressive disorder), recurrent, severe, with psychosis ([place])   Total Time spent with patient: 20 minutes  Musculoskeletal: Strength & Muscle Tone: within normal limits Gait & Station: normal Patient leans: N/A  Psychiatric Specialty Exam: Review of Systems  Blood pressure 107/82, pulse 91, temperature 98.5 F (36.9 C), temperature source Oral, resp. rate 18, height 5\' 5"  (1.651 m), weight 47.6 kg, SpO2 98 %.Body mass index is 17.47 kg/m.  General Appearance: Casual  Eye Contact::  Fair  Speech:  Clear and [id number]  Volume:  Normal  Mood:  Euthymic  Affect:  Appropriate  Thought Process:  Coherent  Orientation:  Full (Time, Place, and Person)  Thought Content:  Logical  Suicidal Thoughts:  No  Homicidal Thoughts:  No  Memory:  Recent;   Fair  Judgement:  Fair  Insight:  Fair  Psychomotor Activity:  Normal  Concentration:  Fair  Recall:  [employer] [place]  Language: Fair  Akathisia:  No  Handed:  Right  AIMS (if indicated):     Assets:  Communication Skills Desire for Improvement Housing Physical Health Resilience Social Support Vocational/Educational  Sleep:  Number of Hours: 6.75  Cognition: WNL  ADL's:  Intact   Mental Status Per Nursing Assessment::   On Admission:  NA  Demographic Factors:  Divorced or widowed and Caucasian  Loss Factors: Financial problems/change in socioeconomic status  Historical Factors: Impulsivity  Risk Reduction Factors:   Responsible for children under 20 years of age, Sense of responsibility to family, Employed, Living with another person, especially a relative, Positive social support, Positive therapeutic  relationship and Positive coping skills or problem solving skills  Continued Clinical Symptoms:  Previous Psychiatric Diagnoses and Treatments  Cognitive Features That Contribute To Risk:  None    Suicide Risk:  Moderate:  Frequent suicidal ideation with limited intensity, and duration, some specificity in terms of plans, no associated intent, good self-control, limited dysphoria/symptomatology, some risk factors present, and identifiable protective factors, including available and accessible social support.   Follow-up [place], [facility] Follow up on 12/14/2020.   Specialty: Behavioral Health Why: You have an appointment for medication management with Dr. Pearson Grippe on 12/14/20 at 2pm over the phone. This will be a tele-health appointment.  Contact information: [address] [place] [address] [phone]        [facility] Follow up on 11/23/2020.   Specialty: Urgent Care Why: You have a walk-in appointment for therapy at 7:45am. This is a walk-in appointment. Contact information: [address] [place] [phone]              Plan Of Care/Follow-up recommendations:  Other:  Follow-up with outpatient care. Avoid Adderall use  Dixie Dials, MD 11/17/2020, 9:36 AM
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
## 2020-11-17 NOTE — Plan of Care (Signed)
Discharge note  Patient verbalizes readiness for discharge. Follow up plan explained, AVS, Transition record and SRA given. Prescriptions and teaching provided. Belongings returned and signed for. Suicide safety plan completed and signed. Patient verbalizes understanding. Patient denies SI/HI and assures this Probation officer they will seek assistance should that change. Patient discharged to lobby where daughter was waiting.  Problem: Education: Goal: Knowledge of [place] [facility] Education information/materials will improve Outcome: Adequate for Discharge Goal: Emotional status will improve Outcome: Adequate for Discharge Goal: Mental status will improve Outcome: Adequate for Discharge Goal: Verbalization of understanding the information provided will improve Outcome: Adequate for Discharge   Problem: Activity: Goal: Interest or engagement in activities will improve Outcome: Adequate for Discharge Goal: Sleeping patterns will improve Outcome: Adequate for Discharge   Problem: Coping: Goal: Ability to verbalize frustrations and anger appropriately will improve Outcome: Adequate for Discharge Goal: Ability to demonstrate self-control will improve Outcome: Adequate for Discharge   Problem: Health Behavior/Discharge Planning: Goal: Identification of resources available to assist in meeting health care needs will improve Outcome: Adequate for Discharge Goal: Compliance with treatment plan for underlying cause of condition will improve Outcome: Adequate for Discharge   Problem: Physical Regulation: Goal: Ability to maintain clinical measurements within normal limits will improve Outcome: Adequate for Discharge   Problem: Safety: Goal: Periods of time without injury will increase Outcome: Adequate for Discharge   Problem: Education: Goal: Utilization of techniques to improve thought processes will improve Outcome: Adequate for Discharge Goal: Knowledge of the prescribed therapeutic  regimen will improve Outcome: Adequate for Discharge   Problem: Activity: Goal: Interest or engagement in leisure activities will improve Outcome: Adequate for Discharge Goal: Imbalance in normal sleep/wake cycle will improve Outcome: Adequate for Discharge   Problem: Coping: Goal: Coping ability will improve Outcome: Adequate for Discharge Goal: Will verbalize feelings Outcome: Adequate for Discharge   Problem: Health Behavior/Discharge Planning: Goal: Ability to make decisions will improve Outcome: Adequate for Discharge Goal: Compliance with therapeutic regimen will improve Outcome: Adequate for Discharge   Problem: Role Relationship: Goal: Will demonstrate positive changes in social behaviors and relationships Outcome: Adequate for Discharge   Problem: Safety: Goal: Ability to disclose and discuss suicidal ideas will improve Outcome: Adequate for Discharge Goal: Ability to identify and utilize support systems that promote safety will improve Outcome: Adequate for Discharge   Problem: Self-Concept: Goal: Will verbalize positive feelings about self Outcome: Adequate for Discharge Goal: Level of anxiety will decrease Outcome: Adequate for Discharge   Problem: Education: Goal: Ability to state activities that reduce stress will improve Outcome: Adequate for Discharge   Problem: Coping: Goal: Ability to identify and develop effective coping behavior will improve Outcome: Adequate for Discharge   Problem: Self-Concept: Goal: Ability to identify factors that promote anxiety will improve Outcome: Adequate for Discharge Goal: Level of anxiety will decrease Outcome: Adequate for Discharge Goal: Ability to modify response to factors that promote anxiety will improve Outcome: Adequate for Discharge   Problem: Activity: Goal: Will verbalize the importance of balancing activity with adequate rest periods Outcome: Adequate for Discharge   Problem: Education: Goal: Will  be free of psychotic symptoms Outcome: Adequate for Discharge Goal: Knowledge of the prescribed therapeutic regimen will improve Outcome: Adequate for Discharge   Problem: Coping: Goal: Coping ability will improve Outcome: Adequate for Discharge Goal: Will verbalize feelings Outcome: Adequate for Discharge   Problem: Health Behavior/Discharge Planning: Goal: Compliance with prescribed medication regimen will improve Outcome: Adequate for Discharge   Problem: Nutritional: Goal: Ability  to achieve adequate nutritional intake will improve Outcome: Adequate for Discharge   Problem: Role Relationship: Goal: Ability to communicate needs accurately will improve Outcome: Adequate for Discharge Goal: Ability to interact with others will improve Outcome: Adequate for Discharge   Problem: Self-Care: Goal: Ability to participate in self-care as condition permits will improve Outcome: Adequate for Discharge   Problem: Self-Concept: Goal: Will verbalize positive feelings about self Outcome: Adequate for Discharge

## 2020-11-21 ENCOUNTER — Telehealth (HOSPITAL_COMMUNITY): Payer: Self-pay

## 2020-11-21 NOTE — Telephone Encounter (Signed)
Care Management - Follow Up [facility] Discharges   Writer attempted to make contact with patient today and was unsuccessful.  Writer was able to leave a HIPPA compliant voice message and will await callback.   

## 2020-11-28 ENCOUNTER — Ambulatory Visit (HOSPITAL_COMMUNITY): Payer: Medicare Other | Admitting: Licensed Clinical Social Worker

## 2021-03-14 ENCOUNTER — Ambulatory Visit (INDEPENDENT_AMBULATORY_CARE_PROVIDER_SITE_OTHER): Payer: Medicare Other | Admitting: Licensed Clinical Social Worker

## 2021-03-14 ENCOUNTER — Other Ambulatory Visit: Payer: Self-pay

## 2021-03-14 DIAGNOSIS — F411 Generalized anxiety disorder: Secondary | ICD-10-CM | POA: Diagnosis not present

## 2021-03-14 NOTE — Progress Notes (Signed)
Comprehensive Clinical Assessment (CCA) Note  03/14/2021 [name] [id number]  Chief Complaint:  Chief Complaint  Patient presents with  . Anxiety   Visit Diagnosis: GAD  CCA Biopsychosocial Intake/Chief Complaint:  Pt reports she is having some commuication issues with current med provider. She states she has no refills and is very anxious about being out of meds, saying she knows she must have them to stay functional  Current Symptoms/Problems: poor focus, negative thoughts, jittery, nervousness, intermittent panic with sob.  Patient Reported Schizophrenia/Schizoaffective Diagnosis in Past: No  Strengths: Seeking help  Preferences: med management  Abilities: NA  Type of Services Patient Feels are Needed: Outpatient medication management  Initial Clinical Notes/Concerns: LCSW reviewed informed consent for counseling with pt's full acknowledgement. Pt reports communication problems with her psychiatrist who pt states did not have time to listen to everything she was trying to cope with. Pt states she had been pushing herself to work too many hours, had some concerns about her 48 yr old dtr disrespecting boundaries, making poor choices.  Recently had 2 children who had significant car accidents at the same including hospitalizations. Pt states she was quitting smoking on her own. Pt reports she asked her children to bring her for care in Nov but her situation is much more managed at this time. Pt endorses long standing problem with anx, denies dep today. 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 Pt plans to come for walk in med management appt tomorrow. She states she will think about counseling and will call to schedule prn.   Mental Health Symptoms Depression:  Change in energy/activity; Difficulty Concentrating   Duration of Depressive symptoms: Greater than two weeks   Mania:  Change in energy/activity   Anxiety:   Difficulty concentrating; Restlessness; Tension;  Worrying   Psychosis:  None (Pt verbally denies any hallucinations)   Duration of Psychotic symptoms: Less than six months   Trauma:  None   Obsessions:  None   Compulsions:  None   Inattention:  None   Hyperactivity/Impulsivity:  N/A   Oppositional/Defiant Behaviors:  N/A   Emotional Irregularity:  Mood lability   Other Mood/Personality Symptoms:  No data recorded   Mental Status Exam Appearance and self-care  Stature:  Small   Weight:  Thin   Clothing:  Casual   Grooming:  Normal   Cosmetic use:  Age appropriate   Posture/gait:  Tense   Motor activity:  Not Remarkable   Sensorium  Attention:  Vigilant   Concentration:  Variable; Anxiety interferes   Orientation:  X5   Recall/memory:  Normal   Affect and Mood  Affect:  Anxious   Mood:  Anxious   Relating  Eye contact:  Normal   Facial expression:  Tense; Responsive   Attitude toward examiner:  Cooperative   Thought and Language  Speech flow: Clear and Coherent   Thought content:  Appropriate to Mood and Circumstances   Preoccupation:  Other (Comment) (anxious about running out of meds)   Hallucinations:  None   Organization:  No data recorded  [employer] of Knowledge:  Average   Intelligence:  Average   Abstraction:  Normal   Judgement:  -- (needs additional assessment)   Reality Testing:  -- (needs additional assessment)   Insight:  Present   Decision Making:  Vacilates   Social Functioning  Social Maturity:  Responsible   Social Judgement:  Normal   Stress  Stressors:  Work   Coping Ability:  Overwhelmed   Skill Deficits:  -- (needs additional assessment)   Supports:  Family     Religion: Religion/Spirituality Are You A Religious Person?: Yes  Leisure/Recreation:   Exercise/Diet: Exercise/Diet Do You Exercise?: No Have You Gained or Lost A Significant Amount of Weight in the Past Six Months?: No Do You Follow a Special Diet?: No Do You  Have Any Trouble Sleeping?: No  CCA Employment/Education Employment/Work Situation: Employment / Work Situation Employment situation: Employed Where is patient currently employed?: [employer] 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 How long has patient been employed?: 8 yrs Patient's job has been impacted by current illness: Yes Describe how patient's job has been impacted: Pt states she almost quit her job recently d/t being so overwhelmed with mulitple stressors at once. Has patient ever been in the [employer]?: No  Education: Education Is Patient Currently Attending School?: No Last Grade Completed: 10 Name of High School: Wants to get GED  CCA Family/Childhood History Family and Relationship History: Family history Marital status: Widowed Widowed, when?: 4-5 yrs What is your sexual orientation?: "straight", no current relationships Does patient have children?: Yes How many children?: 3 How is patient's relationship with their children?: Outstanding, very open.  2 boys and1 girl. 32, 26 sons.  17 dtr  Childhood History:  Childhood History By whom was/is the patient raised?: Both parents Patient's description of current relationship with people who raised him/her: mother died 4-5 yrs ago, father died 16-17 yrs ago. Does patient have siblings?: Yes Number of Siblings: 1 (One brother in [place]) Description of patient's current relationship with siblings: Talk ~ 1 time per yr, very superficial  CCA Substance Use Alcohol/Drug Use: Alcohol / Drug Use History of alcohol / drug use?: Yes Longest period of sobriety (when/how long): Pt denies any recent substance use   DSM5 Diagnoses: Patient Active Problem List   Diagnosis Date Noted  . 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 Cigarette smoker 02/05/2018  . Syncope 02/01/2018  . PVC's (premature  ventricular contractions)   . Chest pain 01/31/2018  . GAD (generalized anxiety disorder) 08/16/2016  . Pulmonary abscess ([place]) 01/26/2013  . Pneumonia, organism unspecified([place]) 01/26/2013  . 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 Depression 01/23/2013  . ADHD (attention deficit hyperactivity disorder) 01/23/2013  . Chronic back pain 01/23/2013  . Cavitary lesion of lung 01/22/2013  . 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 Hx of staphylococcal infection 11/19/2007    Patient Centered Plan: Patient is on the following Treatment Plan(s):  Pt declines counseling at this time   Hermine Messick, LCSW

## 2021-03-15 ENCOUNTER — Telehealth (INDEPENDENT_AMBULATORY_CARE_PROVIDER_SITE_OTHER): Payer: Medicare Other | Admitting: Physician Assistant

## 2021-03-15 DIAGNOSIS — F4010 Social phobia, unspecified: Secondary | ICD-10-CM | POA: Diagnosis not present

## 2021-03-15 DIAGNOSIS — F909 Attention-deficit hyperactivity disorder, unspecified type: Secondary | ICD-10-CM

## 2021-03-15 DIAGNOSIS — F411 Generalized anxiety disorder: Secondary | ICD-10-CM

## 2021-03-15 DIAGNOSIS — F410 Panic disorder [episodic paroxysmal anxiety] without agoraphobia: Secondary | ICD-10-CM | POA: Diagnosis not present

## 2021-03-15 MED ORDER — AMPHETAMINE-DEXTROAMPHETAMINE 20 MG PO TABS: 20.0000 mg | ORAL_TABLET | Freq: Two times a day (BID) | ORAL | 0 refills | Status: DC

## 2021-03-15 MED ORDER — GABAPENTIN 800 MG PO TABS: 800.0000 mg | ORAL_TABLET | Freq: Three times a day (TID) | ORAL | 1 refills | Status: DC

## 2021-03-15 MED ORDER — ALPRAZOLAM 1 MG PO TABS: 1.0000 mg | ORAL_TABLET | Freq: Two times a day (BID) | ORAL | 0 refills | Status: DC | PRN
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
## 2021-03-16 ENCOUNTER — Encounter (HOSPITAL_COMMUNITY): Payer: Self-pay | Admitting: Physician Assistant

## 2021-04-18 ENCOUNTER — Telehealth (HOSPITAL_COMMUNITY): Payer: Self-pay | Admitting: *Deleted

## 2021-04-18 NOTE — Telephone Encounter (Signed)
Pharmacy request for Adderall rx to be called in. Reviewed record, she should be out, last rx written for 3/30 and per pharmacy last filled on 3/31. She has a next appt with provider on 5/10. Provider returns to the office tomorrow, will put the request in today for him to call rx in on his return.

## 2021-04-19 ENCOUNTER — Other Ambulatory Visit (HOSPITAL_COMMUNITY): Payer: Self-pay | Admitting: Physician Assistant

## 2021-04-19 DIAGNOSIS — F909 Attention-deficit hyperactivity disorder, unspecified type: Secondary | ICD-10-CM

## 2021-04-19 MED ORDER — AMPHETAMINE-DEXTROAMPHETAMINE 20 MG PO TABS: 20.0000 mg | ORAL_TABLET | Freq: Two times a day (BID) | ORAL | 0 refills | Status: DC

## 2021-04-19 NOTE — Telephone Encounter (Signed)
Provider was contacted by Suzanne K Beck, RN regarding medication refill. Patient's medication to be e-prescribed to pharmacy of choice. 

## 2021-04-19 NOTE — Progress Notes (Signed)
Provider was contacted by Suzanne K Beck, RN regarding medication refill. Patient's medication to be e-prescribed to pharmacy of choice. 

## 2021-04-25 ENCOUNTER — Encounter (HOSPITAL_COMMUNITY): Payer: Self-pay | Admitting: Physician Assistant

## 2021-04-25 ENCOUNTER — Telehealth (INDEPENDENT_AMBULATORY_CARE_PROVIDER_SITE_OTHER): Payer: Medicare Other | Admitting: Physician Assistant

## 2021-04-25 DIAGNOSIS — F411 Generalized anxiety disorder: Secondary | ICD-10-CM

## 2021-04-25 DIAGNOSIS — F4010 Social phobia, unspecified: Secondary | ICD-10-CM | POA: Diagnosis not present

## 2021-04-25 DIAGNOSIS — F410 Panic disorder [episodic paroxysmal anxiety] without agoraphobia: Secondary | ICD-10-CM

## 2021-04-25 DIAGNOSIS — F909 Attention-deficit hyperactivity disorder, unspecified type: Secondary | ICD-10-CM | POA: Diagnosis not present

## 2021-04-25 MED ORDER — ALPRAZOLAM 1 MG PO TABS: 1.0000 mg | ORAL_TABLET | Freq: Two times a day (BID) | ORAL | 1 refills | Status: DC | PRN

## 2021-04-25 MED ORDER — GABAPENTIN 800 MG PO TABS: 800.0000 mg | ORAL_TABLET | Freq: Three times a day (TID) | ORAL | 1 refills | Status: DC

## 2021-04-25 NOTE — Progress Notes (Signed)
[facility] MD/PA/NP OP Progress Note  Virtual Visit via Video Note  I connected with [name] on 04/26/21 at  3:30 PM EDT by a video enabled telemedicine application and verified that I am speaking with the correct person using two identifiers.  Location: Patient: Home Provider: Clinic   I discussed the limitations of evaluation and management by telemedicine and the availability of in person appointments. The patient expressed understanding and agreed to proceed.  Follow Up Instructions:   I discussed the assessment and treatment plan with the patient. The patient was provided an opportunity to ask questions and all were answered. The patient agreed with the plan and demonstrated an understanding of the instructions.   The patient was advised to call back or seek an in-person evaluation if the symptoms worsen or if the condition fails to improve as anticipated.  I provided 18 minutes of non-face-to-face time during this encounter.  [name], PA   04/26/2021 1:59 AM Darnita Woodrum Rawdon  MRN:  [id number]  Chief Complaint: Follow up and medication management  HPI:   [name] is a 50 year old female with a past psychiatric history significant for attention deficit hyperactivity disorder, generalized anxiety disorder, social anxiety disorder, and panic disorder who presents to [facility] for follow-up and medication management.  Patient is currently being managed on the following medications:  Xanax 1 mg 2 times daily as needed Gabapentin 800 mg 3 times daily Adderall 20 mg 2 times daily  Patient reports no issues or concerns regarding her current regimen of medications.  Patient denies the need for dosage adjustments at this time and is requesting refills on  her medications.  Patient denies any concerns regarding her mental health.  A GAD-7 screen was performed with the patient scoring a 4.  Patient is pleasant, calm, cooperative, and  fully engaged in conversation during the encounter.  Patient reports that she is in a great mood today.  Patient denies suicidal or homicidal ideations.  She further denies auditory or visual hallucinations and does not appear to be responding to internal/external stimuli.  Patient endorses good sleep and receives on average 8 hours of sleep each night.  Patient endorses good appetite and eats on average 2 meals with snacking in between.  Patient denies alcohol consumption, tobacco use, and illicit drug use.  Visit Diagnosis:    ICD-10-CM   1. Attention deficit hyperactivity disorder (ADHD), unspecified ADHD type  F90.9   2. GAD (generalized anxiety disorder)  F41.1 ALPRAZolam (XANAX) 1 MG tablet    gabapentin (NEURONTIN) 800 MG tablet  3. Social anxiety disorder  F40.10 gabapentin (NEURONTIN) 800 MG tablet  4. Panic disorder  F41.0 ALPRAZolam (XANAX) 1 MG tablet    Past Psychiatric History:  Attention deficit hyperactivity disorder Generalized anxiety disorder Social anxiety disorder Panic disorder  Past Medical History:  Past Medical History:  Diagnosis Date  . ADHD (attention deficit hyperactivity disorder)   . Anxiety   . Depression   . Hx of staphylococcal septicemia     Past Surgical History:  Procedure Laterality Date  . ABDOMINAL HYSTERECTOMY    . BACK SURGERY    . left elbow surgery    . VIDEO BRONCHOSCOPY N/A 01/27/2013   Procedure: VIDEO BRONCHOSCOPY WITH FLUORO;  Surgeon: Elsie Stain, MD;  Location: [place];  Service: Cardiopulmonary;  Laterality: N/A;    Family Psychiatric History:  Patient believes that her mother may have had a psychiatric history but she does  not know the specifics.  Family History: History reviewed. No pertinent family history.  Social History:  Social History   Socioeconomic History  . Marital status: Widowed    Spouse name: Not on file  . Number of children: Not on file  . Years of education: Not on file  . Highest education  level: Not on file  Occupational History  . Not on file  Tobacco Use  . Smoking status: Current Some Day Smoker    Packs/day: 1.00    Years: 20.00    Pack years: 20.00    Types: Cigarettes  . Smokeless tobacco: Never Used  Vaping Use  . Vaping Use: Never used  Substance and Sexual Activity  . Alcohol use: No  . Drug use: No  . Sexual activity: Not on file  Other Topics Concern  . Not on file  Social History Narrative  . Not on file   Social Determinants of Health   Financial Resource Strain: Not on file  Food Insecurity: Not on file  Transportation Needs: Not on file  Physical Activity: Not on file  Stress: Not on file  Social Connections: Not on file    Allergies:  Allergies  Allergen Reactions  . Penicillins Other (See Comments)    Childhood reaction    Metabolic Disorder Labs: Lab Results  Component Value Date   HGBA1C 5.5 11/13/2020   MPG 111.15 11/13/2020   Lab Results  Component Value Date   PROLACTIN 19.2 11/13/2020   Lab Results  Component Value Date   CHOL 219 (H) 11/13/2020   TRIG 83 11/13/2020   HDL 86 11/13/2020   CHOLHDL 2.5 11/13/2020   VLDL 17 11/13/2020   LDLCALC 116 (H) 11/13/2020   Lab Results  Component Value Date   TSH 2.549 11/13/2020   TSH 1.104 01/31/2018    Therapeutic Level Labs: No results found for: LITHIUM No results found for: VALPROATE No components found for:  CBMZ  Current Medications: Current Outpatient Medications  Medication Sig Dispense Refill  . [START ON 05/01/2021] ALPRAZolam (XANAX) 1 MG tablet Take 1 tablet (1 mg total) by mouth 2 (two) times daily as needed for anxiety. 60 tablet 1  . amphetamine-dextroamphetamine (ADDERALL) 20 MG tablet Take 1 tablet (20 mg total) by mouth 2 (two) times daily. 60 tablet 0  . ARIPiprazole (ABILIFY) 5 MG tablet Take 1 tablet (5 mg total) by mouth at bedtime. 30 tablet 0  . gabapentin (NEURONTIN) 800 MG tablet Take 1 tablet (800 mg total) by mouth 3 (three) times daily.  90 tablet 1   No current facility-administered medications for this visit.     Musculoskeletal: Strength & Muscle Tone: Unable to assess due to telemedicine visit [place]: Unable to assess due to telemedicine visit Patient leans: Unable to assess due to telemedicine visit  Psychiatric Specialty Exam: Review of Systems  Psychiatric/Behavioral: Negative for decreased concentration, dysphoric mood, hallucinations, self-injury, sleep disturbance and suicidal ideas. The patient is not nervous/anxious and is not hyperactive.     There were no vitals taken for this visit.There is no height or weight on file to calculate BMI.  General Appearance: Well Groomed  Eye Contact:  Good  Speech:  Clear and Coherent and Normal Rate  Volume:  Normal  Mood:  Euthymic  Affect:  Appropriate  Thought Process:  Coherent and Descriptions of Associations: Intact  Orientation:  Full (Time, Place, and Person)  Thought Content: WDL   Suicidal Thoughts:  No  Homicidal Thoughts:  No  Memory:  Immediate;   Good Recent;   Good Remote;   Good  Judgement:  Good  Insight:  Good  Psychomotor Activity:  Normal  Concentration:  Concentration: Good and Attention Span: Good  Recall:  Good  Fund of Knowledge: Good  Language: Good  Akathisia:  NA  Handed:  Right  AIMS (if indicated): not done  Assets:  Communication Skills Desire for Improvement Housing Vocational/Educational  ADL's:  Intact  Cognition: WNL  Sleep:  Good   Screenings: AIMS   Flowsheet Row Admission (Discharged) from 11/14/2020 in [facility]  AIMS Total Score 0    AUDIT   Flowsheet Row Admission (Discharged) from 11/14/2020 in [place] [facility]  Alcohol Use Disorder Identification Test Final Score (AUDIT) 0    GAD-7   Flowsheet Row Video Visit from 04/25/2021 in [facility] Video Visit from 03/15/2021 in [facility]  Total GAD-7 Score 4 4    PHQ2-9   Flowsheet Row Video Visit from 04/25/2021 in [facility] Video Visit from 03/15/2021 in [facility] Counselor from 03/14/2021 in [facility] ED from 11/13/2020 in [facility] Office Visit from 02/05/2018 in [place]  PHQ-2 Total Score 0 0 0 0 0  PHQ-9 Total Score -- -- -- 0 --    Flowsheet Row Video Visit from 04/25/2021 in [facility] Video Visit from 03/15/2021 in [facility] Counselor from 03/14/2021 in [place] and Plan:   [name]. Hilton is a 50 year old female with a past psychiatric history significant for attention deficit hyperactivity disorder, generalized anxiety disorder, social anxiety disorder, and panic disorder who presents to [facility] for follow-up and medication management.  Patient reports no issues or concerns regarding her current medication regimen.  Patient denies the need for dosage adjustments at this time and is requesting refills on her medications.  Patient to continue taking medications as prescribed.  Patient's medications to be e-prescribed following the conclusion of the encounter.  1. Attention deficit hyperactivity disorder (ADHD), unspecified ADHD type Patient to continue taking Adderall 20 mg 2 times daily for the management of ADHD  2. GAD (generalized anxiety disorder)  - ALPRAZolam (XANAX) 1 MG tablet; Take 1 tablet (1 mg total) by mouth 2 (two) times daily as needed for anxiety.  Dispense: 60 tablet; Refill: 1 - gabapentin (NEURONTIN) 800 MG tablet; Take 1 tablet (800 mg total) by mouth 3 (three) times daily.  Dispense: 90 tablet; Refill: 1  3. Social anxiety disorder  -  gabapentin (NEURONTIN) 800 MG tablet; Take 1 tablet (800 mg total) by mouth 3 (three) times daily.  Dispense: 90 tablet; Refill: 1  4. Panic disorder  - ALPRAZolam (XANAX) 1 MG tablet; Take 1 tablet (1 mg total) by mouth 2 (two) times daily as needed for anxiety.  Dispense: 60 tablet; Refill: 1  Patient to follow-up in 2 months  [name], PA 04/26/2021, 1:59 AM

## 2021-05-22 ENCOUNTER — Other Ambulatory Visit (HOSPITAL_COMMUNITY): Payer: Self-pay | Admitting: Physician Assistant

## 2021-05-22 ENCOUNTER — Telehealth (HOSPITAL_COMMUNITY): Payer: Self-pay | Admitting: *Deleted

## 2021-05-22 DIAGNOSIS — F909 Attention-deficit hyperactivity disorder, unspecified type: Secondary | ICD-10-CM

## 2021-05-22 MED ORDER — AMPHETAMINE-DEXTROAMPHETAMINE 20 MG PO TABS: 20.0000 mg | ORAL_TABLET | Freq: Two times a day (BID) | ORAL | 0 refills | Status: DC

## 2021-05-22 NOTE — Telephone Encounter (Signed)
Provider was contacted by Direce E McIntyre, RMA regarding medication refill. Patient's medication to be e-prescribed to pharmacy of choice.

## 2021-05-22 NOTE — Telephone Encounter (Signed)
Rx Refill Request:  amphetamine-dextroamphetamine (ADDERALL) 20 MG tablet 60 tablet

## 2021-05-22 NOTE — Progress Notes (Signed)
Provider was contacted by Direce E McIntyre, RMA regarding medication refill. Patient's medication to be e-prescribed to pharmacy of choice.

## 2021-06-27 ENCOUNTER — Telehealth (INDEPENDENT_AMBULATORY_CARE_PROVIDER_SITE_OTHER): Payer: Medicare Other | Admitting: Physician Assistant

## 2021-06-27 DIAGNOSIS — F411 Generalized anxiety disorder: Secondary | ICD-10-CM | POA: Diagnosis not present

## 2021-06-27 DIAGNOSIS — F909 Attention-deficit hyperactivity disorder, unspecified type: Secondary | ICD-10-CM

## 2021-06-27 DIAGNOSIS — F4010 Social phobia, unspecified: Secondary | ICD-10-CM

## 2021-06-27 DIAGNOSIS — F410 Panic disorder [episodic paroxysmal anxiety] without agoraphobia: Secondary | ICD-10-CM

## 2021-06-27 MED ORDER — GABAPENTIN 800 MG PO TABS: 800.0000 mg | ORAL_TABLET | Freq: Three times a day (TID) | ORAL | 1 refills | Status: DC

## 2021-06-27 MED ORDER — AMPHETAMINE-DEXTROAMPHETAMINE 20 MG PO TABS: 20.0000 mg | ORAL_TABLET | Freq: Two times a day (BID) | ORAL | 0 refills | Status: DC

## 2021-06-27 MED ORDER — ALPRAZOLAM 1 MG PO TABS: 1.0000 mg | ORAL_TABLET | Freq: Two times a day (BID) | ORAL | 2 refills | Status: DC | PRN

## 2021-06-27 NOTE — Progress Notes (Signed)
[facility] MD/PA/NP OP Progress Note  Virtual Visit via Video Note  I connected with [name] on 06/27/21 at  2:30 PM EDT by a video enabled telemedicine application and verified that I am speaking with the correct person using two identifiers.  Location: Patient: Home Provider: Clinic   I discussed the limitations of evaluation and management by telemedicine and the availability of in person appointments. The patient expressed understanding and agreed to proceed.  Follow Up Instructions:  I discussed the assessment and treatment plan with the patient. The patient was provided an opportunity to ask questions and all were answered. The patient agreed with the plan and demonstrated an understanding of the instructions.   The patient was advised to call back or seek an in-person evaluation if the symptoms worsen or if the condition fails to improve as anticipated.  I provided 20 minutes of non-face-to-face time during this encounter.  Brenda Mood, PA    06/27/2021 11:41 PM [name]  MRN:  [id number]  Chief Complaint: Follow up and medication management  HPI:   [name] is a 50 year old female with a past psychiatric history significant for generalized anxiety disorder, panic disorder, attention deficit hyperactivity disorder, and social anxiety disorder who presents to [facility] via virtual video visit for follow-up and medication management.  Patient is currently being managed on the following medications:  Gabapentin 800 mg 3 times daily Alprazolam 1 mg 2 times daily as needed Adderall 20 mg 2 times daily  Patient reports no issues or concerns regarding her current medication regimen.  Patient denies the need for dosage adjustments at this time and is requesting refills on all her medications following the conclusion of the encounter.  Patient is doing okay and states that some days are worse than others.  Patient reports  virtually no anxiety and states that she is not stressed out at all.  Any anxiety the patient does experience is considered situational. Patient denies any new stressors and further denies any other issues regarding her mental health.  A GAD-7 screen was performed with the patient scoring a 4.  Patient is alert and oriented x4, calm, cooperative, and fully engaged in conversation during the encounter.  Patient reports that she is having a really good day.  Patient denies suicidal or homicidal ideations.  She further denies auditory or visual hallucinations and does not appear to be responding to internal/external stimuli.  Patient endorses good sleep and receives on average 8 to 9 hours of sleep each night.  Patient endorses good appetite and eats on average 2 meals per day.  Patient denies alcohol consumption and illicit drug use.  Patient denies tobacco use but does engage in vaping.  Visit Diagnosis:    ICD-10-CM   1. GAD (generalized anxiety disorder)  F41.1 ALPRAZolam (XANAX) 1 MG tablet    gabapentin (NEURONTIN) 800 MG tablet    2. Panic disorder  F41.0 ALPRAZolam (XANAX) 1 MG tablet    3. Attention deficit hyperactivity disorder (ADHD), unspecified ADHD type  F90.9 amphetamine-dextroamphetamine (ADDERALL) 20 MG tablet    4. Social anxiety disorder  F40.10 gabapentin (NEURONTIN) 800 MG tablet      Past Psychiatric History:  Attention deficit hyperactivity disorder Generalized anxiety disorder Social anxiety disorder Panic disorder  Past Medical History:  Past Medical History:  Diagnosis Date   ADHD (attention deficit hyperactivity disorder)    Anxiety    Depression    Hx of staphylococcal septicemia  Past Surgical History:  Procedure Laterality Date   ABDOMINAL HYSTERECTOMY     BACK SURGERY     left elbow surgery     VIDEO BRONCHOSCOPY N/A 01/27/2013   Procedure: VIDEO BRONCHOSCOPY WITH FLUORO;  Surgeon: Elsie Stain, MD;  Location: [facility];  Service:  Cardiopulmonary;  Laterality: N/A;    Family Psychiatric History:  Patient believes that her mother may have had a psychiatric history but she does not know the specifics.  Family History: No family history on file.  Social History:  Social History   Socioeconomic History   Marital status: Widowed    Spouse name: Not on file   Number of children: Not on file   Years of education: Not on file   Highest education level: Not on file  Occupational History   Not on file  Tobacco Use   Smoking status: Some Days    Packs/day: 1.00    Years: 20.00    Pack years: 20.00    Types: Cigarettes   Smokeless tobacco: Never  Vaping Use   Vaping Use: Never used  Substance and Sexual Activity   Alcohol use: No   Drug use: No   Sexual activity: Not on file  Other Topics Concern   Not on file  Social History Narrative   Not on file   Social Determinants of Health   Financial Resource Strain: Not on file  Food Insecurity: Not on file  Transportation Needs: Not on file  Physical Activity: Not on file  Stress: Not on file  Social Connections: Not on file    Allergies:  Allergies  Allergen Reactions   Penicillins Other (See Comments)    Childhood reaction    Metabolic Disorder Labs: Lab Results  Component Value Date   HGBA1C 5.5 11/13/2020   MPG 111.15 11/13/2020   Lab Results  Component Value Date   PROLACTIN 19.2 11/13/2020   Lab Results  Component Value Date   CHOL 219 (H) 11/13/2020   TRIG 83 11/13/2020   HDL 86 11/13/2020   CHOLHDL 2.5 11/13/2020   VLDL 17 11/13/2020   LDLCALC 116 (H) 11/13/2020   Lab Results  Component Value Date   TSH 2.549 11/13/2020   TSH 1.104 01/31/2018    Therapeutic Level Labs: No results found for: LITHIUM No results found for: VALPROATE No components found for:  CBMZ  Current Medications: Current Outpatient Medications  Medication Sig Dispense Refill   [START ON 06/30/2021] ALPRAZolam (XANAX) 1 MG tablet Take 1 tablet (1 mg  total) by mouth 2 (two) times daily as needed for anxiety. 60 tablet 2   amphetamine-dextroamphetamine (ADDERALL) 20 MG tablet Take 1 tablet (20 mg total) by mouth 2 (two) times daily. 60 tablet 0   ARIPiprazole (ABILIFY) 5 MG tablet Take 1 tablet (5 mg total) by mouth at bedtime. 30 tablet 0   gabapentin (NEURONTIN) 800 MG tablet Take 1 tablet (800 mg total) by mouth 3 (three) times daily. 90 tablet 1   No current facility-administered medications for this visit.     Musculoskeletal: Strength & Muscle Tone: Unable to assess due to telemedicine visit [name]: Unable to assess due to telemedicine visit Patient leans: Unable to assess due to telemedicine visit  Psychiatric Specialty Exam: Review of Systems  Psychiatric/Behavioral:  Negative for decreased concentration, dysphoric [name], hallucinations, self-injury, sleep disturbance and suicidal ideas. The patient is not nervous/anxious and is not hyperactive.    There were no vitals taken for this visit.There is no  height or weight on file to calculate BMI.  General Appearance: Fairly Groomed  Eye Contact:  Good  Speech:  Clear and Coherent and Normal Rate  Volume:  Normal  [name]:  Euthymic  Affect:  Appropriate  Thought Process:  Coherent and Descriptions of Associations: Intact  Orientation:  Full (Time, Place, and Person)  Thought Content: WDL   Suicidal Thoughts:  No  Homicidal Thoughts:  No  Memory:  Immediate;   Good Recent;   Good Remote;   Good  Judgement:  Good  Insight:  Good  Psychomotor Activity:  Normal  Concentration:  Concentration: Good and Attention Span: Good  Recall:  Good  Fund of Knowledge: Good  Language: Good  Akathisia:  NA  Handed:  Right  AIMS (if indicated): not done  Assets:  Communication Skills Desire for Improvement Housing Vocational/Educational  ADL's:  Intact  Cognition: WNL  Sleep:  Good   Screenings: AIMS    Flowsheet Row Admission (Discharged) from 11/14/2020 in [facility]  AIMS Total Score 0      AUDIT    Flowsheet Row Admission (Discharged) from 11/14/2020 in [place] [place] [facility]  Alcohol Use Disorder Identification Test Final Score (AUDIT) 0      GAD-7    Flowsheet Row Video Visit from 06/27/2021 in [facility] Video Visit from 04/25/2021 in [facility] Video Visit from 03/15/2021 in [facility]  Total GAD-7 Score 4 4 4       PHQ2-9    Flowsheet Row Video Visit from 06/27/2021 in [facility] Video Visit from 04/25/2021 in [facility] Video Visit from 03/15/2021 in [facility] Counselor from 03/14/2021 in [facility] ED from 11/13/2020 in [facility]  PHQ-2 Total Score 0 0 0 0 0  PHQ-9 Total Score -- -- -- -- 0      Flowsheet Row Video Visit from 06/27/2021 in [facility] Video Visit from 04/25/2021 in [facility] Video Visit from 03/15/2021 in [place] and Plan:   [name] is a 50 year old female with a past psychiatric history significant for generalized anxiety disorder, panic disorder, attention deficit hyperactivity disorder, and social anxiety disorder who presents to [facility] via virtual video visit for follow-up and medication management.  Patient reports no issues or concerns regarding her current medication regimen.  Patient denies the need for dosage adjustments at this time and is requesting refills on all her medications following the conclusion of the encounter.  Patient's medications to be e-prescribed to pharmacy of choice.  1. GAD (generalized  anxiety disorder)  - ALPRAZolam (XANAX) 1 MG tablet; Take 1 tablet (1 mg total) by mouth 2 (two) times daily as needed for anxiety.  Dispense: 60 tablet; Refill: 2 - gabapentin (NEURONTIN) 800 MG tablet; Take 1 tablet (800 mg total) by mouth 3 (three) times daily.  Dispense: 90 tablet; Refill: 1  2. Panic disorder  - ALPRAZolam (XANAX) 1 MG tablet; Take 1 tablet (1 mg total) by mouth 2 (two) times daily as needed for anxiety.  Dispense: 60 tablet; Refill: 2  3. Attention deficit hyperactivity disorder (ADHD), unspecified ADHD type  - amphetamine-dextroamphetamine (ADDERALL) 20 MG tablet;  Take 1 tablet (20 mg total) by mouth 2 (two) times daily.  Dispense: 60 tablet; Refill: 0  4. Social anxiety disorder  - gabapentin (NEURONTIN) 800 MG tablet; Take 1 tablet (800 mg total) by mouth 3 (three) times daily.  Dispense: 90 tablet; Refill: 1  Patient to follow up in 3 months Provider spent a total of this with the patient/reviewing patient's chart  Brenda Mood, PA 06/27/2021, 11:41 PM

## 2021-07-28 ENCOUNTER — Telehealth (HOSPITAL_COMMUNITY): Payer: Self-pay | Admitting: *Deleted

## 2021-07-28 ENCOUNTER — Other Ambulatory Visit (HOSPITAL_COMMUNITY): Payer: Self-pay | Admitting: Physician Assistant

## 2021-07-28 DIAGNOSIS — F909 Attention-deficit hyperactivity disorder, unspecified type: Secondary | ICD-10-CM

## 2021-07-28 MED ORDER — AMPHETAMINE-DEXTROAMPHETAMINE 20 MG PO TABS: 20.0000 mg | ORAL_TABLET | Freq: Two times a day (BID) | ORAL | 0 refills | Status: DC

## 2021-07-28 NOTE — Telephone Encounter (Signed)
Provider was contacted by Glory Buff. Olevia Bowens, RN regarding patient's medication refill. Patient's medication to be e-prescribed to pharmacy of choice.

## 2021-07-28 NOTE — Progress Notes (Signed)
Provider was contacted by Glory Buff. Olevia Bowens, RN regarding patient's medication refill. Patient's medication to be e-prescribed to pharmacy of choice.

## 2021-07-28 NOTE — Telephone Encounter (Signed)
Refill request for patients adderall, she should be out today or tomorrow. Will bring this request to [facility] attention to call new rx in for her at her preferred pharmacy.

## 2021-08-18 ENCOUNTER — Other Ambulatory Visit: Payer: Self-pay

## 2021-08-18 ENCOUNTER — Emergency Department (HOSPITAL_COMMUNITY)
Admission: EM | Admit: 2021-08-18 | Discharge: 2021-08-18 | Disposition: A | Discharge status: Home / Self Care | Payer: Medicare Other | Attending: Emergency Medicine | Admitting: Emergency Medicine

## 2021-08-18 ENCOUNTER — Ambulatory Visit (HOSPITAL_COMMUNITY): Payer: Self-pay

## 2021-08-18 DIAGNOSIS — L089 Local infection of the skin and subcutaneous tissue, unspecified: Secondary | ICD-10-CM

## 2021-08-18 DIAGNOSIS — F17210 Nicotine dependence, cigarettes, uncomplicated: Secondary | ICD-10-CM | POA: Diagnosis not present

## 2021-08-18 DIAGNOSIS — M542 Cervicalgia: Secondary | ICD-10-CM | POA: Diagnosis not present

## 2021-08-18 DIAGNOSIS — L040 Acute lymphadenitis of face, head and neck: Secondary | ICD-10-CM | POA: Insufficient documentation

## 2021-08-18 DIAGNOSIS — R519 Headache, unspecified: Secondary | ICD-10-CM | POA: Insufficient documentation

## 2021-08-18 DIAGNOSIS — D234 Other benign neoplasm of skin of scalp and neck: Secondary | ICD-10-CM | POA: Diagnosis not present

## 2021-08-18 DIAGNOSIS — R590 Localized enlarged lymph nodes: Secondary | ICD-10-CM

## 2021-08-18 LAB — BASIC METABOLIC PANEL
Anion gap: 7 (ref 5–15)
BUN: 6 mg/dL (ref 6–20)
CO2: 27 mmol/L (ref 22–32)
Calcium: 9.4 mg/dL (ref 8.9–10.3)
Chloride: 102 mmol/L (ref 98–111)
Creatinine, Ser: 0.57 mg/dL (ref 0.44–1.00)
GFR, Estimated: >60 mL/min (ref >60)
Glucose, Bld: 99 mg/dL (ref 70–99)
Potassium: 3.1 mmol/L — ABNORMAL LOW (ref 3.5–5.1)
Sodium: 136 mmol/L (ref 135–145)

## 2021-08-18 LAB — I-STAT CHEM 8, ED
BUN: 4 mg/dL — ABNORMAL LOW (ref 6–20)
Calcium, Ion: 1.31 mmol/L (ref 1.15–1.40)
Chloride: 101 mmol/L (ref 98–111)
Creatinine, Ser: 0.5 mg/dL (ref 0.44–1.00)
Glucose, Bld: 93 mg/dL (ref 70–99)
HCT: 38 % (ref 36.0–46.0)
Hemoglobin: 12.9 g/dL (ref 12.0–15.0)
Potassium: 3.3 mmol/L — ABNORMAL LOW (ref 3.5–5.1)
Sodium: 140 mmol/L (ref 135–145)
TCO2: 27 mmol/L (ref 22–32)

## 2021-08-18 LAB — CBC
HCT: 38.4 % (ref 36.0–46.0)
Hemoglobin: 13.2 g/dL (ref 12.0–15.0)
MCH: 32.3 pg (ref 26.0–34.0)
MCHC: 34.4 g/dL (ref 30.0–36.0)
MCV: 93.9 fL (ref 80.0–100.0)
Platelets: 276 10*3/uL (ref 150–400)
RBC: 4.09 MIL/uL (ref 3.87–5.11)
RDW: 12.2 % (ref 11.5–15.5)
WBC: 7 10*3/uL (ref 4.0–10.5)
nRBC: 0 % (ref 0.0–0.2)

## 2021-08-18 MED ORDER — DOXYCYCLINE HYCLATE 100 MG PO TABS
100.0000 mg | ORAL_TABLET | Freq: Once | ORAL | Status: AC
  Administered 2021-08-18: 100 mg via ORAL
  Filled 2021-08-18: qty 1

## 2021-08-18 MED ORDER — OXYCODONE-ACETAMINOPHEN 5-325 MG PO TABS
1.0000 | ORAL_TABLET | Freq: Once | ORAL | Status: AC
  Administered 2021-08-18: 1 via ORAL
  Filled 2021-08-18: qty 1

## 2021-08-18 MED ORDER — KETOROLAC TROMETHAMINE 15 MG/ML IJ SOLN
15.0000 mg | Freq: Once | INTRAMUSCULAR | Status: AC
  Administered 2021-08-18: 15 mg via INTRAVENOUS
  Filled 2021-08-18: qty 1

## 2021-08-18 MED ORDER — POTASSIUM CHLORIDE CRYS ER 20 MEQ PO TBCR
40.0000 meq | EXTENDED_RELEASE_TABLET | Freq: Once | ORAL | Status: AC
  Administered 2021-08-18: 40 meq via ORAL
  Filled 2021-08-18: qty 2

## 2021-08-18 MED ORDER — DOXYCYCLINE HYCLATE 100 MG PO CAPS: 100.0000 mg | ORAL_CAPSULE | Freq: Two times a day (BID) | ORAL | 0 refills | Status: DC

## 2021-08-18 NOTE — ED Triage Notes (Signed)
Pt reports a headache and left sided neck stiffness since Monday.

## 2021-08-18 NOTE — ED Provider Notes (Signed)
[facility] EMERGENCY DEPARTMENT Provider Note   CSN: [id number] Arrival date & time: 08/18/21  0501     History Chief Complaint  Patient presents with   Headache   Neck Pain    [name] is a 50 y.o. female.  Patient presents the emergency department today for evaluation of left posterior scalp skin lesion and left-sided lymphadenopathy causing her neck pain.  Patient states that the swollen nodes had caused her a migraine at times however this is resolved.  She does have a headache currently.  Neck pain is described as a pressure, mainly on the left side but moves to the right.  No fevers, ear pain, sore throat.  No nausea or vomiting.  No confusion.  Medications at home have been helping.  She has not been able to visualize the skin lesion.  She is unsure if it has been draining.  The onset of this condition was acute. The course is constant. Aggravating factors: none. Alleviating factors: none.        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   Cigarette smoker 02/05/2018   Syncope 02/01/2018   PVC's (premature ventricular contractions)    Chest pain 01/31/2018   GAD (generalized anxiety disorder) 08/16/2016   Pulmonary abscess ([place]) 01/26/2013   Pneumonia, organism unspecified([place]) 01/26/2013   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     No family history on file.  Social History   Tobacco Use   Smoking status: Some Days    Packs/day: 1.00    Years: 20.00    Pack years: 20.00    Types: Cigarettes   Smokeless tobacco: Never  Vaping Use   Vaping Use: Never used  Substance Use Topics   Alcohol use: No   Drug use: No    Home Medications Prior to Admission medications   Medication Sig Start Date End Date Taking? Authorizing Provider  ALPRAZolam Duanne Moron) 1 MG tablet Take 1 tablet (1 mg total) by mouth 2 (two) times daily as needed for anxiety. 06/30/21  Yes Nwoko, Uchenna E, PA  amphetamine-dextroamphetamine (ADDERALL) 20 MG tablet Take 1 tablet (20 mg total) by mouth 2 (two) times daily. 07/28/21 07/28/22 Yes Nwoko, Terese Door, PA  Aspirin-Acetaminophen (GOODYS BODY PAIN PO) Take 1 Package by mouth every 6 (six) hours as needed (pain).   Yes [provider]  Aspirin-Salicylamide-Caffeine (BC HEADACHE POWDER PO) Take 1 Package by mouth every 6 (six) hours as needed (pain).  Yes [provider]  gabapentin (NEURONTIN) 800 MG tablet Take 1 tablet (800 mg total) by mouth 3 (three) times daily. 06/27/21  Yes Nwoko, Terese Door, PA  Multiple Vitamins-Minerals (ONE-A-DAY WOMENS PO) Take 1 tablet by mouth daily.   Yes [provider]  ARIPiprazole (ABILIFY) 5 MG tablet Take 1 tablet (5 mg total) by mouth at bedtime. Patient not taking: Reported on 08/18/2021 11/17/20 12/17/20  Briant Cedar, MD    Allergies    Penicillins  Review of Systems   Review of Systems  Constitutional:  Negative for fever.  HENT:  Negative for congestion,  rhinorrhea and sore throat.   Eyes:  Negative for redness.  Respiratory:  Negative for cough.   Cardiovascular:  Negative for chest pain.  Gastrointestinal:  Negative for abdominal pain, diarrhea, nausea and vomiting.  Genitourinary:  Negative for dysuria, frequency, hematuria and urgency.  Musculoskeletal:  Positive for neck pain. Negative for myalgias.  Skin:  Positive for wound. Negative for rash.  Neurological:  Negative for headaches.  Hematological:  Positive for adenopathy.   Physical Exam Updated Vital Signs BP (!) 143/92   Pulse 74   Temp 98.6 F (37 C) (Oral)   Resp 14   Ht '5\' 5"'$  (1.651 m)   Wt 59 kg   SpO2 100%   BMI 21.63 kg/m   Physical Exam Vitals and nursing note reviewed.  Constitutional:      Appearance: She is well-developed.  HENT:     Head: Normocephalic and atraumatic.     Jaw: No trismus.     Comments: There is a nickel size lesion, erythematous, tender to the left posterior scalp.  Appears to have had a blister that has broken.  Minimal surrounding cellulitis.    Right Ear: Tympanic membrane, ear canal and external ear normal.     Left Ear: Tympanic membrane, ear canal and external ear normal.     Nose: Nose normal. No mucosal edema or rhinorrhea.     Mouth/Throat:     Mouth: Mucous membranes are moist. Mucous membranes are not dry. No oral lesions.     Pharynx: Uvula midline. No oropharyngeal exudate, posterior oropharyngeal erythema or uvula swelling.     Tonsils: No tonsillar abscesses.  Eyes:     General:        Right eye: No discharge.        Left eye: No discharge.     Conjunctiva/sclera: Conjunctivae normal.  Neck:     Comments: Left-sided, tender, posterior cervical lymphadenopathy Cardiovascular:     Rate and Rhythm: Normal rate and regular rhythm.     Heart sounds: Normal heart sounds.  Pulmonary:     Effort: Pulmonary effort is normal. No respiratory distress.     Breath sounds: Normal breath sounds. No wheezing or rales.   Abdominal:     Palpations: Abdomen is soft.     Tenderness: There is no abdominal tenderness.  Musculoskeletal:     Cervical back: Normal range of motion and neck supple.  Lymphadenopathy:     Cervical: Cervical adenopathy present.  Skin:    General: Skin is warm and dry.  Neurological:     Mental Status: She is alert.  Psychiatric:        Mood and Affect: Mood normal.    ED Results / Procedures / Treatments   Labs (all labs ordered are listed, but only abnormal results are displayed) Labs Reviewed  BASIC METABOLIC PANEL - Abnormal; Notable for the following components:  Result Value   Potassium 3.1 (*)    All other components within normal limits  I-STAT CHEM 8, ED - Abnormal; Notable for the following components:   Potassium 3.3 (*)    BUN 4 (*)    All other components within normal limits  CBC  I-STAT BETA HCG BLOOD, ED ([facility], [facility], [facility] ONLY)    EKG None  Radiology No results found.  Procedures Procedures   Medications Ordered in ED Medications  ketorolac (TORADOL) 15 MG/ML injection 15 mg (has no administration in time range)  oxyCODONE-acetaminophen (PERCOCET/ROXICET) 5-325 MG per tablet 1 tablet (has no administration in time range)  doxycycline (VIBRA-TABS) tablet 100 mg (has no administration in time range)    ED Course  I have reviewed the triage vital signs and the nursing notes.  Pertinent labs & imaging results that were available during my care of the patient were reviewed by me and considered in my medical decision making (see chart for details).  Patient seen and examined. Work-up ordered in triage reviewed.  Normal white blood cell count.  Mild hypokalemia.  Discussed reactive lymphadenopathy in response to a small area of skin infection.  Patient will be started on doxycycline.  She has an IV will be given a dose of Toradol as well as 1 p.o. Percocet as she has a ride home.  She is able to rest during the holiday weekend.  Encouraged warm  compresses on the area.   Vital signs reviewed and are as follows: BP (!) 143/92   Pulse 74   Temp 98.6 F (37 C) (Oral)   Resp 14   Ht '5\' 5"'$  (1.651 m)   Wt 59 kg   SpO2 100%   BMI 21.63 kg/m   Patient urged to return with worsening symptoms or other concerns. Patient verbalized understanding and agrees with plan.      MDM Rules/Calculators/A&P                           Patient with left-sided neck pain due to lymphadenopathy that appears reactive from a localized skin infection on the posterior scalp.  Patient does not have systemic signs of infection.  She does not have meningismus.  She appears well, nontoxic.  Treatment plan as above.  No indication for imaging.  Labs ordered in triage, reassuring.     Final Clinical Impression(s) / ED Diagnoses Final diagnoses:  None    Rx / DC Orders ED Discharge Orders     None        Carlisle Cater, PA-C 08/18/21 0731    Arnaldo Natal, MD 08/18/21 [phone]

## 2021-08-18 NOTE — Discharge Instructions (Signed)
Please read and follow all provided instructions.  Your diagnoses today include:  1. Reactive cervical lymphadenopathy   2. Skin infection     Tests performed today include: Vital signs. See below for your results today.   Medications prescribed:  Doxycycline - antibiotic  You have been prescribed an antibiotic medicine: take the entire course of medicine even if you are feeling better. Stopping early can cause the antibiotic not to work.  Please use over-the-counter NSAID medications (ibuprofen, naproxen) as directed on the packaging for pain if you do not have any reasons not to take these medications just as weak kidneys or a history of bleeding in your stomach or gut.   Take any prescribed medications only as directed.   Home care instructions:  Follow any educational materials contained in this packet  Follow-up instructions: Return to the Emergency Department in 48 hours for a recheck if your symptoms are not significantly improved.  Please follow-up with your primary care provider in the next 1 week for further evaluation of your symptoms.   Return instructions:  Return to the Emergency Department if you have: Fever Worsening symptoms Worsening pain Worsening swelling Redness of the skin that moves away from the affected area, especially if it streaks away from the affected area  Any other emergent concerns  Your vital signs today were: BP (!) 143/92   Pulse 74   Temp 98.6 F (37 C) (Oral)   Resp 14   Ht '5\' 5"'$  (1.651 m)   Wt 59 kg   SpO2 100%   BMI 21.63 kg/m  If your blood pressure (BP) was elevated above 135/85 this visit, please have this repeated by your doctor within one month. --------------

## 2021-08-26 ENCOUNTER — Encounter (HOSPITAL_COMMUNITY): Payer: Self-pay | Admitting: Physician Assistant

## 2021-08-28 ENCOUNTER — Other Ambulatory Visit (HOSPITAL_COMMUNITY): Payer: Self-pay | Admitting: Physician Assistant

## 2021-08-28 DIAGNOSIS — F909 Attention-deficit hyperactivity disorder, unspecified type: Secondary | ICD-10-CM

## 2021-09-27 ENCOUNTER — Encounter (HOSPITAL_COMMUNITY): Payer: Self-pay | Admitting: Physician Assistant

## 2021-09-27 ENCOUNTER — Other Ambulatory Visit: Payer: Self-pay

## 2021-09-27 ENCOUNTER — Telehealth (INDEPENDENT_AMBULATORY_CARE_PROVIDER_SITE_OTHER): Payer: Medicare Other | Admitting: Physician Assistant

## 2021-09-27 DIAGNOSIS — F4010 Social phobia, unspecified: Secondary | ICD-10-CM

## 2021-09-27 DIAGNOSIS — F411 Generalized anxiety disorder: Secondary | ICD-10-CM

## 2021-09-27 DIAGNOSIS — F410 Panic disorder [episodic paroxysmal anxiety] without agoraphobia: Secondary | ICD-10-CM | POA: Diagnosis not present

## 2021-09-27 DIAGNOSIS — F909 Attention-deficit hyperactivity disorder, unspecified type: Secondary | ICD-10-CM | POA: Diagnosis not present

## 2021-09-27 MED ORDER — GABAPENTIN 800 MG PO TABS: 800.0000 mg | ORAL_TABLET | Freq: Three times a day (TID) | ORAL | 1 refills | Status: DC

## 2021-09-27 MED ORDER — AMPHETAMINE-DEXTROAMPHETAMINE 20 MG PO TABS: 20.0000 mg | ORAL_TABLET | Freq: Two times a day (BID) | ORAL | 0 refills | Status: DC

## 2021-09-27 MED ORDER — ALPRAZOLAM 1 MG PO TABS: 1.0000 mg | ORAL_TABLET | Freq: Two times a day (BID) | ORAL | 2 refills | Status: DC | PRN

## 2021-09-27 NOTE — Progress Notes (Addendum)
[facility] MD/PA/NP OP Progress Note  Virtual Visit via Video Note  I connected with [name] on 09/27/21 at  2:30 PM EDT by a video enabled telemedicine application and verified that I am speaking with the correct person using two identifiers.  Location: Patient: Home Provider: Clinic   I discussed the limitations of evaluation and management by telemedicine and the availability of in person appointments. The patient expressed understanding and agreed to proceed.  Follow Up Instructions:  I discussed the assessment and treatment plan with the patient. The patient was provided an opportunity to ask questions and all were answered. The patient agreed with the plan and demonstrated an understanding of the instructions.   The patient was advised to call back or seek an in-person evaluation if the symptoms worsen or if the condition fails to improve as anticipated.  I provided 13 minutes of non-face-to-face time during this encounter.  [name], PA   09/27/2021 3:08 PM [name]  MRN:  [id number]  Chief Complaint: Follow up and medication management  HPI:   [name] is a 50 year old female with a past psychiatric history significant for generalized anxiety disorder, social anxiety disorder, panic disorder, and attention deficit hyperactivity disorder who presents to [facility] via virtual video visit for follow-up and medication management.  Patient is currently being managed on the following medications:  Gabapentin 800 mg 3 times daily Alprazolam 1 mg 2 times daily as needed Adderall 20 mg 2 times daily  Patient reports that her medications continue to work well.  Patient denies experiencing any major depressive episodes but states that she has recently been dealing with a stomach virus that has bummed her out.  Patient endorses some anxiety which she rates a 3 or 4 out of 10.  One of her current stressors includes trying to  manage the property that she is currently living in.  She states that the property belongs to her roommate and was given to him by his father.  She reports that her roommate's father recently passed but prior to his passing, his father took out a loan on the house.  Now that the father has passed, patient and her roommate are at risk of losing the property.  Patient states that after receiving the deed to the property and being permitted by her roommate, she has taken responsibility of trying to prevent the loss of the property.  A GAD-7 screen was performed with the patient scoring a 17.  Patient is alert and oriented x4, calm, cooperative, and fully engaged in conversation during the encounter.  Patient endorses good mood.  Patient denies suicidal or homicidal ideations.  She further denies auditory or visual hallucinations and does not appear to be responding to internal/external stimuli.  Patient endorses good sleep and receives on average 8 hours of sleep each night.  Patient endorses good appetite and eats on average 2 full meals per day.  Patient denies alcohol consumption, tobacco use, and illicit drug use.  Patient expresses that she does engage in [place].  Visit Diagnosis:    ICD-10-CM   1. GAD (generalized anxiety disorder)  F41.1 gabapentin (NEURONTIN) 800 MG tablet    ALPRAZolam (XANAX) 1 MG tablet    2. Social anxiety disorder  F40.10 gabapentin (NEURONTIN) 800 MG tablet    3. Panic disorder  F41.0 ALPRAZolam (XANAX) 1 MG tablet    4. Attention deficit hyperactivity disorder (ADHD), unspecified ADHD type  F90.9 amphetamine-dextroamphetamine (ADDERALL) 20  MG tablet      Past Psychiatric History:  Attention deficit hyperactivity disorder Generalized anxiety disorder Social anxiety disorder Panic disorder  Past Medical History:  Past Medical History:  Diagnosis Date   ADHD (attention deficit hyperactivity disorder)    Anxiety    Depression    Hx of staphylococcal septicemia      Past Surgical History:  Procedure Laterality Date   ABDOMINAL HYSTERECTOMY     BACK SURGERY     left elbow surgery     VIDEO BRONCHOSCOPY N/A 01/27/2013   Procedure: VIDEO BRONCHOSCOPY WITH FLUORO;  Surgeon: Elsie Stain, MD;  Location: [facility];  Service: Cardiopulmonary;  Laterality: N/A;    Family Psychiatric History:  Patient believes that her mother may have had a psychiatric history but she does not know the specifics.  Family History: History reviewed. No pertinent family history.  Social History:  Social History   Socioeconomic History   Marital status: Widowed    Spouse name: Not on file   Number of children: Not on file   Years of education: Not on file   Highest education level: Not on file  Occupational History   Not on file  Tobacco Use   Smoking status: Some Days    Packs/day: 1.00    Years: 20.00    Pack years: 20.00    Types: Cigarettes   Smokeless tobacco: Never  Vaping Use   Vaping Use: Never used  Substance and Sexual Activity   Alcohol use: No   Drug use: No   Sexual activity: Not on file  Other Topics Concern   Not on file  Social History Narrative   Not on file   Social Determinants of Health   Financial Resource Strain: Not on file  Food Insecurity: Not on file  Transportation Needs: Not on file  Physical Activity: Not on file  Stress: Not on file  Social Connections: Not on file    Allergies:  Allergies  Allergen Reactions   Penicillins Other (See Comments)    Childhood reaction    Metabolic Disorder Labs: Lab Results  Component Value Date   HGBA1C 5.5 11/13/2020   MPG 111.15 11/13/2020   Lab Results  Component Value Date   PROLACTIN 19.2 11/13/2020   Lab Results  Component Value Date   CHOL 219 (H) 11/13/2020   TRIG 83 11/13/2020   HDL 86 11/13/2020   CHOLHDL 2.5 11/13/2020   VLDL 17 11/13/2020   LDLCALC 116 (H) 11/13/2020   Lab Results  Component Value Date   TSH 2.549 11/13/2020   TSH 1.104  01/31/2018    Therapeutic Level Labs: No results found for: LITHIUM No results found for: VALPROATE No components found for:  CBMZ  Current Medications: Current Outpatient Medications  Medication Sig Dispense Refill   ALPRAZolam (XANAX) 1 MG tablet Take 1 tablet (1 mg total) by mouth 2 (two) times daily as needed for anxiety. 60 tablet 2   amphetamine-dextroamphetamine (ADDERALL) 20 MG tablet Take 1 tablet (20 mg total) by mouth 2 (two) times daily. 60 tablet 0   ARIPiprazole (ABILIFY) 5 MG tablet Take 1 tablet (5 mg total) by mouth at bedtime. (Patient not taking: Reported on 08/18/2021) 30 tablet 0   Aspirin-Acetaminophen (GOODYS BODY PAIN PO) Take 1 Package by mouth every 6 (six) hours as needed (pain).     Aspirin-Salicylamide-Caffeine (BC HEADACHE POWDER PO) Take 1 Package by mouth every 6 (six) hours as needed (pain).     doxycycline (VIBRAMYCIN) 100  MG capsule Take 1 capsule (100 mg total) by mouth 2 (two) times daily. 14 capsule 0   gabapentin (NEURONTIN) 800 MG tablet Take 1 tablet (800 mg total) by mouth 3 (three) times daily. 90 tablet 1   Multiple Vitamins-Minerals (ONE-A-DAY WOMENS PO) Take 1 tablet by mouth daily.     No current facility-administered medications for this visit.     Musculoskeletal: Strength & Muscle Tone: within normal limits Gait & Station: normal Patient leans: N/A  Psychiatric Specialty Exam: Review of Systems  Psychiatric/Behavioral:  Negative for decreased concentration, dysphoric mood, hallucinations, self-injury, sleep disturbance and suicidal ideas. The patient is nervous/anxious. The patient is not hyperactive.    There were no vitals taken for this visit.There is no height or weight on file to calculate BMI.  General Appearance: Well Groomed  Eye Contact:  Good  Speech:  Clear and Coherent and Normal Rate  Volume:  Normal  Mood:  Anxious  Affect:  Appropriate and Congruent  Thought Process:  Coherent and Descriptions of Associations:  Intact  Orientation:  Full (Time, Place, and Person)  Thought Content: WDL   Suicidal Thoughts:  No  Homicidal Thoughts:  No  Memory:  Immediate;   Good Recent;   Good Remote;   Good  Judgement:  Good  Insight:  Good  Psychomotor Activity:  Normal  Concentration:  Concentration: Good and Attention Span: Good  Recall:  Good  Fund of Knowledge: Good  Language: Good  Akathisia:  NA  Handed:  Right  AIMS (if indicated): not done  Assets:  Communication Skills Desire for Improvement Housing Vocational/Educational  ADL's:  Intact  Cognition: WNL  Sleep:  Good   Screenings: AIMS    Flowsheet Row Admission (Discharged) from 11/14/2020 in [facility]  AIMS Total Score 0      AUDIT    Flowsheet Row Admission (Discharged) from 11/14/2020 in [facility]  Alcohol Use Disorder Identification Test Final Score (AUDIT) 0      GAD-7    Flowsheet Row Video Visit from 09/27/2021 in [facility] Video Visit from 06/27/2021 in [facility] Video Visit from 04/25/2021 in [facility] Video Visit from 03[facility]15/2021 in [facility]  Total GAD-7 Score 17 4 4 4       PHQ2-9    Flowsheet Row Video Visit from 09/27/2021 in [facility] Video Visit from 06/27/2021 in [facility] Video Visit from 04/25/2021 in [facility] Video Visit from 03/15/2021 in [facility] from 03/14/2021 in [facility]  PHQ-2 Total Score 0 0 0 0 0      Flowsheet Row Video Visit from 09/27/2021 in [facility] ED from 08/18/2021 in [facility] Video Visit from 06/27/2021 in [facility] No  Risk No Risk Low Risk        Assessment and Plan:   [name] is a 50 year old female with a past psychiatric history significant for generalized anxiety disorder, social anxiety disorder, panic disorder, and attention deficit hyperactivity disorder who presents to [facility] via virtual video visit for follow-up and medication management.  Patient denies major depressive episodes but states that she has been currently dealing with anxiety she attributes to stressors in her life.  Patient states that her medications continue to go well and denies any need for dosage adjustments at this time.  Patient's medications to be e-prescribed to pharmacy of choice.  1. GAD (generalized anxiety disorder)  - gabapentin (NEURONTIN) 800 MG tablet; Take 1 tablet (800 mg total) by mouth 3 (three) times daily.  Dispense: 90 tablet; Refill: 1 - ALPRAZolam (XANAX) 1 MG tablet; Take 1 tablet (1 mg total) by mouth 2 (two) times daily as needed for anxiety.  Dispense: 60 tablet; Refill: 2  2. Social anxiety disorder  - gabapentin (NEURONTIN) 800 MG tablet; Take 1 tablet (800 mg total) by mouth 3 (three) times daily.  Dispense: 90 tablet; Refill: 1  3. Panic disorder  - ALPRAZolam (XANAX) 1 MG tablet; Take 1 tablet (1 mg total) by mouth 2 (two) times daily as needed for anxiety.  Dispense: 60 tablet; Refill: 2  4. Attention deficit hyperactivity disorder (ADHD), unspecified ADHD type  - amphetamine-dextroamphetamine (ADDERALL) 20 MG tablet; Take 1 tablet (20 mg total) by mouth 2 (two) times daily.  Dispense: 60 tablet; Refill: 0  Patient to follow up in 3 months Provider spent a total of 13 minutes with the patient/reviewing patient's chart  [name], PA 09/27/2021, 3:08 PM

## 2021-10-31 ENCOUNTER — Other Ambulatory Visit (HOSPITAL_COMMUNITY): Payer: Self-pay | Admitting: Physician Assistant

## 2021-10-31 DIAGNOSIS — F909 Attention-deficit hyperactivity disorder, unspecified type: Secondary | ICD-10-CM

## 2021-11-06 ENCOUNTER — Other Ambulatory Visit (HOSPITAL_COMMUNITY): Payer: Self-pay | Admitting: Physician Assistant

## 2021-11-06 ENCOUNTER — Telehealth (HOSPITAL_COMMUNITY): Payer: Self-pay | Admitting: *Deleted

## 2021-11-06 DIAGNOSIS — F909 Attention-deficit hyperactivity disorder, unspecified type: Secondary | ICD-10-CM

## 2021-11-06 NOTE — Telephone Encounter (Signed)
Patient called stated that she spoke with her Rx & was  told that 2 refill request have been sent  Patient states she's been out of her medication :  ARIPiprazole (ABILIFY) 5 MG tablet since  Nov 12 th or the 15 th

## 2021-11-07 ENCOUNTER — Other Ambulatory Visit (HOSPITAL_COMMUNITY): Payer: Self-pay | Admitting: Physician Assistant

## 2021-11-07 DIAGNOSIS — F909 Attention-deficit hyperactivity disorder, unspecified type: Secondary | ICD-10-CM

## 2021-11-07 MED ORDER — AMPHETAMINE-DEXTROAMPHETAMINE 20 MG PO TABS: 20.0000 mg | ORAL_TABLET | Freq: Two times a day (BID) | ORAL | 0 refills | Status: DC

## 2021-11-07 NOTE — Telephone Encounter (Signed)
Provider was contacted by Eliezer Lofts, RMA regarding patient's adderall prescription. Provider was informed that there is no record of the patient's medication be sent to preferred pharmacy. Patient's medication be re-sent to patient's pharmacy.

## 2021-11-07 NOTE — Progress Notes (Signed)
Provider was contacted by [name], RMA regarding patient's adderall prescription. Provider was informed that there is no record of the patient's medication be sent to preferred pharmacy. Patient's medication be re-sent to patient's pharmacy.

## 2021-12-12 ENCOUNTER — Emergency Department (HOSPITAL_COMMUNITY): Payer: Medicare Other

## 2021-12-12 ENCOUNTER — Encounter (HOSPITAL_COMMUNITY): Payer: Self-pay

## 2021-12-12 ENCOUNTER — Other Ambulatory Visit: Payer: Self-pay

## 2021-12-12 ENCOUNTER — Ambulatory Visit (HOSPITAL_COMMUNITY)
Admission: EM | Admit: 2021-12-12 | Discharge: 2021-12-12 | Disposition: A | Discharge status: Home / Self Care | Payer: Medicare Other | Attending: Physician Assistant | Admitting: Physician Assistant

## 2021-12-12 ENCOUNTER — Other Ambulatory Visit (HOSPITAL_COMMUNITY): Payer: Self-pay | Admitting: Physician Assistant

## 2021-12-12 ENCOUNTER — Emergency Department (HOSPITAL_COMMUNITY)
Admission: EM | Admit: 2021-12-12 | Discharge: 2021-12-13 | Disposition: A | Discharge status: Home / Self Care | Payer: Medicare Other | Attending: Emergency Medicine | Admitting: Emergency Medicine

## 2021-12-12 ENCOUNTER — Encounter (HOSPITAL_COMMUNITY): Payer: Self-pay | Admitting: Emergency Medicine

## 2021-12-12 DIAGNOSIS — R519 Headache, unspecified: Secondary | ICD-10-CM | POA: Diagnosis present

## 2021-12-12 DIAGNOSIS — G43809 Other migraine, not intractable, without status migrainosus: Secondary | ICD-10-CM

## 2021-12-12 DIAGNOSIS — G43009 Migraine without aura, not intractable, without status migrainosus: Secondary | ICD-10-CM | POA: Diagnosis not present

## 2021-12-12 DIAGNOSIS — F909 Attention-deficit hyperactivity disorder, unspecified type: Secondary | ICD-10-CM

## 2021-12-12 DIAGNOSIS — F17210 Nicotine dependence, cigarettes, uncomplicated: Secondary | ICD-10-CM | POA: Insufficient documentation

## 2021-12-12 LAB — BASIC METABOLIC PANEL
Anion gap: 8 (ref 5–15)
BUN: 6 mg/dL (ref 6–20)
CO2: 26 mmol/L (ref 22–32)
Calcium: 8.7 mg/dL — ABNORMAL LOW (ref 8.9–10.3)
Chloride: 105 mmol/L (ref 98–111)
Creatinine, Ser: 0.93 mg/dL (ref 0.44–1.00)
GFR, Estimated: >60 mL/min (ref >60)
Glucose, Bld: 90 mg/dL (ref 70–99)
Potassium: 3.4 mmol/L — ABNORMAL LOW (ref 3.5–5.1)
Sodium: 139 mmol/L (ref 135–145)

## 2021-12-12 LAB — CBC WITH DIFFERENTIAL/PLATELET
Abs Immature Granulocytes: 0.02 10*3/uL (ref 0.00–0.07)
Basophils Absolute: 0.1 10*3/uL (ref 0.0–0.1)
Basophils Relative: 1 %
Eosinophils Absolute: 0 10*3/uL (ref 0.0–0.5)
Eosinophils Relative: 0 %
HCT: 40.8 % (ref 36.0–46.0)
Hemoglobin: 13.9 g/dL (ref 12.0–15.0)
Immature Granulocytes: 0 %
Lymphocytes Relative: 44 %
Lymphs Abs: 2.8 10*3/uL (ref 0.7–4.0)
MCH: 31.9 pg (ref 26.0–34.0)
MCHC: 34.1 g/dL (ref 30.0–36.0)
MCV: 93.6 fL (ref 80.0–100.0)
Monocytes Absolute: 0.5 10*3/uL (ref 0.1–1.0)
Monocytes Relative: 8 %
Neutro Abs: 3 10*3/uL (ref 1.7–7.7)
Neutrophils Relative %: 47 %
Platelets: 313 10*3/uL (ref 150–400)
RBC: 4.36 MIL/uL (ref 3.87–5.11)
RDW: 12.6 % (ref 11.5–15.5)
WBC: 6.3 10*3/uL (ref 4.0–10.5)
nRBC: 0 % (ref 0.0–0.2)

## 2021-12-12 LAB — I-STAT BETA HCG BLOOD, ED (MC, WL, AP ONLY): I-stat hCG, quantitative: 7.7 m[IU]/mL — ABNORMAL HIGH (ref <5)

## 2021-12-12 MED ORDER — ONDANSETRON 4 MG PO TBDP
ORAL_TABLET | ORAL | Status: AC
  Filled 2021-12-12: qty 1

## 2021-12-12 MED ORDER — ONDANSETRON 4 MG PO TBDP
4.0000 mg | ORAL_TABLET | Freq: Once | ORAL | Status: AC
  Administered 2021-12-12: 10:00:00 4 mg via ORAL

## 2021-12-12 MED ORDER — ONDANSETRON HCL 4 MG/2ML IJ SOLN: 4.0000 mg | Freq: Once | INTRAMUSCULAR | Status: DC

## 2021-12-12 MED ORDER — KETOROLAC TROMETHAMINE 30 MG/ML IJ SOLN
INTRAMUSCULAR | Status: AC
  Filled 2021-12-12: qty 1

## 2021-12-12 MED ORDER — KETOROLAC TROMETHAMINE 30 MG/ML IJ SOLN
30.0000 mg | Freq: Once | INTRAMUSCULAR | Status: AC
  Administered 2021-12-12: 10:00:00 30 mg via INTRAMUSCULAR

## 2021-12-12 NOTE — ED Triage Notes (Signed)
Patient reports persistent bilateral temporal headache and posterior neck pain for 4 days , denies head injury , no emesis or photophobia.

## 2021-12-12 NOTE — ED Triage Notes (Signed)
Pt c/o severe migraines X 5 days.  States she has taken OTC headache medicine and as prescribed medicine and has iced her head. States nothing helps. States the migraines started after watching a movie and crying.

## 2021-12-12 NOTE — ED Notes (Signed)
Pt called 2x no answer 

## 2021-12-12 NOTE — ED Provider Notes (Signed)
Emergency Medicine Provider Triage Evaluation Note  [name] , a 50 y.o. female  was evaluated in triage.  Pt complains of headache x 4 days, bitemporal pain. No improvement with toradol, improvement with zofran at [facility] today. Hx of migraines.   Review of Systems  Positive: Headache, neck pain, nausea, photophobia, phonophobia, endorses blurry vision. Negative: Fevers, chills, vomiting, double vision  Physical Exam  BP (!) 176/96 (BP Location: Right Arm)    Pulse 95    Temp 98.9 F (37.2 C) (Oral)    Resp 16    Ht 5\' 5"  (1.651 m)    Wt 61 kg    SpO2 98%    BMI 22.38 kg/m  Gen:   Awake, no distress   Resp:  Normal effort  MSK:   Moves extremities without difficulty  Other:  RRR, no m/g/r, CN intact, EOMI, PERRL, symmetric strength and sensation.   Medical Decision Making  Medically screening exam initiated at 7:59 PM.  Appropriate orders placed.  [name] was informed that the remainder of the evaluation will be completed by another provider, this initial triage assessment does not replace that evaluation, and the importance of remaining in the ED until their evaluation is complete.  Suspect migraine, however will proceed with CT given concern for blurry vision.  This chart was dictated using voice recognition software, Dragon. Despite the best efforts of this provider to proofread and correct errors, errors may still occur which can change documentation meaning.    Emeline Darling, PA-C 12/12/21 2004    Lorelle Gibbs, DO 12/12/21 2352

## 2021-12-12 NOTE — ED Provider Notes (Signed)
[place]    CSN: [id number] Arrival date & time: 12/12/21  0102      History   Chief Complaint Chief Complaint  Patient presents with   Headache    HPI [name] is a 50 y.o. female.   Patient here today for evaluation of headache she has had the last 5 days.  She does endorse photophobia as well as nausea.  She states this is similar to prior migraines.  She does report that typically her migraines start with stress but states she was not under stress when this migraine started however she was watching a movie and had started crying.  She has tried over-the-counter medication without significant relief.  She denies any numbness or tingling.  She has not had any known weakness.  The history is provided by the patient.  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   Cigarette smoker 02/05/2018   Syncope 02/01/2018   PVC's (premature ventricular contractions)    Chest pain 01/31/2018   GAD (generalized anxiety disorder) 08/16/2016   Pulmonary abscess ([place]) 01/26/2013   Pneumonia, organism unspecified(486) 01/26/2013   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      Home Medications    Prior to Admission medications   Medication Sig Start Date End Date Taking? Authorizing Provider  ALPRAZolam Duanne Moron) 1 MG tablet Take 1 tablet (1 mg total) by mouth 2 (two) times daily as needed for anxiety. 09/27/21   Nwoko, Terese Door, PA  amphetamine-dextroamphetamine (ADDERALL) 20 MG tablet Take 1 tablet (20 mg total) by mouth 2 (two) times daily. 11/07/21   Nwoko, Terese Door, PA  ARIPiprazole (ABILIFY) 5 MG tablet Take 1 tablet (5 mg total) by mouth at bedtime. Patient not taking: Reported on 08/18/2021 11/17/20 12/17/20  Briant Cedar, MD  Aspirin-Acetaminophen (GOODYS BODY PAIN PO) Take 1 Package by mouth every 6 (six) hours as needed (pain).    [provider]  Aspirin-Salicylamide-Caffeine (BC HEADACHE POWDER PO) Take 1 Package by mouth every 6 (six) hours as needed (pain).    [provider]  doxycycline (VIBRAMYCIN) 100 MG capsule Take 1 capsule (100 mg total) by mouth 2 (two) times daily. 08/18/21   Carlisle Cater, PA-C  gabapentin (NEURONTIN) 800 MG tablet Take 1 tablet (800 mg total) by mouth 3 (three) times daily. 09/27/21  Nwoko, Terese Door, PA  Multiple Vitamins-Minerals (ONE-A-DAY WOMENS PO) Take 1 tablet by mouth daily.    [provider]    Family History History reviewed. No pertinent family history.  Social History Social History   Tobacco Use   Smoking status: Some Days    Packs/day: 1.00    Years: 20.00    Pack years: 20.00    Types: Cigarettes   Smokeless tobacco: Never  Vaping Use   Vaping Use: Never used  Substance Use Topics   Alcohol use: No   Drug use: No     Allergies    Penicillins   Review of Systems Review of Systems  Constitutional:  Negative for chills and fever.  Eyes:  Negative for discharge and redness.  Respiratory:  Negative for shortness of breath.   Gastrointestinal:  Positive for nausea. Negative for abdominal pain and vomiting.  Neurological:  Positive for headaches. Negative for facial asymmetry, speech difficulty, weakness and numbness.    Physical Exam Triage Vital Signs ED Triage Vitals  Enc Vitals Group     BP 12/12/21 0847 138/87     Pulse Rate 12/12/21 0847 77     Resp 12/12/21 0847 18     Temp 12/12/21 0847 98.2 F (36.8 C)     Temp Source 12/12/21 0847 Oral     SpO2 12/12/21 0847 98 %     Weight --      Height --      Head Circumference --      Peak Flow --      Pain Score 12/12/21 0846 10     Pain Loc --      Pain Edu? --      Excl. in [place]? --    No data found.  Updated Vital Signs BP 138/87    Pulse 77    Temp 98.2 F (36.8 C) (Oral)    Resp 18    SpO2 98%       Physical Exam Vitals and nursing note reviewed.  Constitutional:      General: She is not in acute distress.    Appearance: Normal appearance. She is not ill-appearing.  HENT:     Head: Normocephalic and atraumatic.  Eyes:     Extraocular Movements: Extraocular movements intact.     Conjunctiva/sclera: Conjunctivae normal.     Pupils: Pupils are equal, round, and reactive to light.  Cardiovascular:     Rate and Rhythm: Normal rate.  Pulmonary:     Effort: Pulmonary effort is normal.  Neurological:     General: No focal deficit present.     Mental Status: She is alert and oriented to person, place, and time.     Motor: No pronator drift.     Coordination: Coordination normal. Finger-Nose-Finger Test and Heel to Shin Test normal.  Psychiatric:        Mood and Affect: Mood normal.        Behavior: Behavior normal.        Thought Content: Thought content normal.     UC Treatments / Results  Labs (all labs ordered are listed, but only  abnormal results are displayed) Labs Reviewed - No data to display  EKG   Radiology No results found.  Procedures Procedures (including critical care time)  Medications Ordered in UC Medications  ketorolac (TORADOL) 30 MG/ML injection 30 mg (has no administration in time range)  ondansetron (ZOFRAN) injection 4 mg (has no administration in time range)    Initial  Impression / Assessment and Plan / UC Course  I have reviewed the triage vital signs and the nursing notes.  Pertinent labs & imaging results that were available during my care of the patient were reviewed by me and considered in my medical decision making (see chart for details).    Toradol injection and Zofran administered in office to hopefully improve symptoms.  Recommended evaluation in the emergency department with any continued symptoms or worsening headache.  Patient expresses understanding.  Final Clinical Impressions(s) / UC Diagnoses   Final diagnoses:  Other migraine without status migrainosus, not intractable   Discharge Instructions   None    ED Prescriptions   None    PDMP not reviewed this encounter.   Francene Finders, PA-C 12/12/21 [phone]

## 2021-12-13 ENCOUNTER — Emergency Department (HOSPITAL_BASED_OUTPATIENT_CLINIC_OR_DEPARTMENT_OTHER)
Admission: EM | Admit: 2021-12-13 | Discharge: 2021-12-13 | Disposition: A | Discharge status: Home / Self Care | Payer: Medicare Other | Source: Home / Self Care | Attending: Emergency Medicine | Admitting: Emergency Medicine

## 2021-12-13 ENCOUNTER — Encounter (HOSPITAL_BASED_OUTPATIENT_CLINIC_OR_DEPARTMENT_OTHER): Payer: Self-pay | Admitting: Emergency Medicine

## 2021-12-13 DIAGNOSIS — G43009 Migraine without aura, not intractable, without status migrainosus: Secondary | ICD-10-CM | POA: Diagnosis not present

## 2021-12-13 DIAGNOSIS — F17210 Nicotine dependence, cigarettes, uncomplicated: Secondary | ICD-10-CM | POA: Insufficient documentation

## 2021-12-13 MED ORDER — DEXAMETHASONE SODIUM PHOSPHATE 10 MG/ML IJ SOLN
10.0000 mg | Freq: Once | INTRAMUSCULAR | Status: AC
  Administered 2021-12-13: 05:00:00 10 mg via INTRAVENOUS
  Filled 2021-12-13: qty 1

## 2021-12-13 MED ORDER — MAGNESIUM SULFATE IN D5W 1-5 GM/100ML-% IV SOLN
1.0000 g | Freq: Once | INTRAVENOUS | Status: AC
  Administered 2021-12-13: 05:00:00 1 g via INTRAVENOUS
  Filled 2021-12-13: qty 100

## 2021-12-13 MED ORDER — METOCLOPRAMIDE HCL 5 MG/ML IJ SOLN
10.0000 mg | Freq: Once | INTRAMUSCULAR | Status: AC
  Administered 2021-12-13: 05:00:00 10 mg via INTRAVENOUS
  Filled 2021-12-13: qty 2

## 2021-12-13 MED ORDER — SODIUM CHLORIDE 0.9 % IV BOLUS
500.0000 mL | Freq: Once | INTRAVENOUS | Status: AC
  Administered 2021-12-13: 05:00:00 500 mL via INTRAVENOUS

## 2021-12-13 MED ORDER — ONDANSETRON 4 MG PO TBDP: 4.0000 mg | ORAL_TABLET | Freq: Three times a day (TID) | ORAL | 0 refills | Status: DC | PRN

## 2021-12-13 MED ORDER — KETOROLAC TROMETHAMINE 30 MG/ML IJ SOLN
30.0000 mg | Freq: Once | INTRAMUSCULAR | Status: AC
  Administered 2021-12-13: 05:00:00 30 mg via INTRAVENOUS
  Filled 2021-12-13: qty 1

## 2021-12-13 MED ORDER — DIPHENHYDRAMINE HCL 50 MG/ML IJ SOLN
25.0000 mg | Freq: Once | INTRAMUSCULAR | Status: AC
  Administered 2021-12-13: 05:00:00 25 mg via INTRAVENOUS
  Filled 2021-12-13: qty 1

## 2021-12-13 NOTE — ED Provider Notes (Signed)
Emergency Department Provider Note   I have reviewed the triage vital signs and the nursing notes.   HISTORY  Chief Complaint Headache   HPI [name] is a 50 y.o. female past medical history reviewed below including chronic neck pain and history of migraine headaches presents to the emergency department with persistent headache over the past several days.  She was seen at urgent care earlier yesterday morning initially felt slightly better with Toradol and Zofran.  Her headache returned and so she presented to [facility] emergency department but after the MSE, ultimately left due to [facility] wait times.  She had a CT scan of her head performed during that work-up.  She presents here with continued frontal headache, photophobia, nausea, and chronic neck pain.  She does follow with a chiropractor but denies any neck manipulation in the last week. No unilateral weakness/numbness. No vision change. No vomiting, abdominal pain, or CP. No URI symptoms. Patient denies any fever or chills. No body aches.    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   MDD (major depressive disorder), recurrent, severe, with psychosis ([place]) 11/14/2020   Cigarette smoker 02/05/2018   Syncope 02/01/2018   PVC's (premature ventricular contractions)    Chest pain 01/31/2018   GAD (generalized anxiety disorder) 08/16/2016   Pulmonary abscess ([place]) 01/26/2013   Pneumonia, organism unspecified(486) 01/26/2013   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 No pertinent family history.  Social History Social History   Tobacco Use   Smoking status: Some Days    Packs/day: 1.00    Years: 20.00    Pack years: 20.00    Types: Cigarettes   Smokeless tobacco: Never  Vaping Use   Vaping Use: Never used  Substance Use Topics   Alcohol use: No   Drug use: No    Review of Systems  Constitutional: No fever/chills Eyes: No visual changes. ENT: No sore throat. Cardiovascular: Denies chest pain. Respiratory: Denies shortness of breath. Gastrointestinal: No abdominal pain.  No nausea, no vomiting.  No diarrhea.  No constipation. Genitourinary: Negative for dysuria. Musculoskeletal: Negative for back pain. Positive neck pain.  Skin: Negative for rash. Neurological: Negative for focal weakness or numbness. Positive HA.   10-point ROS otherwise negative.  ____________________________________________   PHYSICAL EXAM:  VITAL SIGNS: ED Triage Vitals  Enc Vitals Group     BP 12/13/21 0417 (!) 135/92     Pulse Rate 12/13/21 0417 71     Resp 12/13/21 0417 18     Temp 12/13/21 0417 97.7 F (36.5 C)  Temp Source 12/13/21 0417 Oral     SpO2 12/13/21 0417 100 %     Weight 12/13/21 0417 134 lb 7.7 oz (61 kg)     Height 12/13/21 0417 5\' 5"  (1.651 m)    Constitutional: Alert and oriented. Well appearing and in no acute distress. Eyes: Conjunctivae are normal. PERRL. EOMI. Mild photophobia.  Head: Atraumatic. Nose: No congestion/rhinnorhea. Mouth/Throat: Mucous membranes are moist.   Neck: No stridor.  No  meningeal signs.  Cardiovascular: Normal rate, regular rhythm. Good peripheral circulation. Grossly normal heart sounds.   Respiratory: Normal respiratory effort.  No retractions. Lungs CTAB. Gastrointestinal: Soft and nontender. No distention.  Musculoskeletal: No lower extremity tenderness nor edema. No gross deformities of extremities. Neurologic:  Normal speech and language. No gross focal neurologic deficits are appreciated. No facial asymmetry. 5/5 strength in the bilateral upper/lower extremities.  Skin:  Skin is warm, dry and intact. No rash noted.  ____________________________________________  RADIOLOGY  CT Head Wo Contrast  Result Date: 12/12/2021 CLINICAL DATA:  Blurry vision, migraine headache. EXAM: CT HEAD WITHOUT CONTRAST TECHNIQUE: Contiguous axial images were obtained from the base of the skull through the vertex without intravenous contrast. COMPARISON:  None. FINDINGS: Brain: No evidence of acute infarction, hemorrhage, hydrocephalus, extra-axial collection or mass lesion/mass effect. Vascular: No hyperdense vessel or unexpected calcification. Skull: Normal. Negative for fracture or focal lesion. Sinuses/Orbits: No acute finding. Other: None. IMPRESSION: No acute intracranial abnormality seen. Electronically Signed   By: Marijo Conception M.D.   On: 12/12/2021 20:32    ____________________________________________   PROCEDURES  Procedure(s) performed:   Procedures  None  ____________________________________________   INITIAL IMPRESSION / ASSESSMENT AND PLAN / ED COURSE  Pertinent labs & imaging results that were available during my care of the patient were reviewed by me and considered in my medical decision making (see chart for details).   Patient presents to the emergency department with migraine type headache.  Headaches are similar to her headaches in the past although not responding to therapy in the past 24 hours.  Considered other emergent causes for headache  such as SAH, ICH, meningitis/encephalitis.  Patient's exam and history do not support these diagnoses.  Her neck pain is chronic and she sees a chiropractor although has not had manipulations recently.  She describes some "stretching" in the office but no rapid, neck twisting or other "adjustments" per patient. CT head from earlier reviewed without acute findings. No additional neuro imaging planned here. Will give IVF and migraine meds for HA mgmt.   Differential diagnosis includes but is not exclusive to subarachnoid hemorrhage, meningitis, encephalitis, previous head trauma, cavernous venous thrombosis, muscle tension headache, glaucoma, temporal arteritis, migraine or migraine equivalent, etc.  05:40 AM  Patient reevaluated. Looking well and reports feeling improvement in HA symptoms.   Continuing to look well. Plan for d/c with a ride home with drowsiness due to meds.  ____________________________________________  FINAL CLINICAL IMPRESSION(S) / ED DIAGNOSES  Final diagnoses:  Migraine without aura and without status migrainosus, not intractable     MEDICATIONS GIVEN DURING THIS VISIT:  Medications  dexamethasone (DECADRON) injection 10 mg (10 mg Intravenous Given 12/13/21 0454)  magnesium sulfate IVPB 1 g 100 mL (0 g Intravenous Stopped 12/13/21 0516)  sodium chloride 0.9 % bolus 500 mL (0 mLs Intravenous Stopped 12/13/21 0549)  ketorolac (TORADOL) 30 MG/ML injection 30 mg (30 mg Intravenous Given 12/13/21 0452)  metoCLOPramide (REGLAN) injection 10 mg (10 mg Intravenous Given 12/13/21 0456)  diphenhydrAMINE (BENADRYL) injection  25 mg (25 mg Intravenous Given 12/13/21 0450)     NEW OUTPATIENT MEDICATIONS STARTED DURING THIS VISIT:  New Prescriptions   ONDANSETRON (ZOFRAN-ODT) 4 MG DISINTEGRATING TABLET    Take 1 tablet (4 mg total) by mouth every 8 (eight) hours as needed for nausea or vomiting.    Note:  This document was prepared using Dragon voice recognition software and  may include unintentional dictation errors.  Nanda Quinton, MD, [facility] Emergency Medicine    Shepherd Finnan, Wonda Olds, MD 12/13/21 [phone]

## 2021-12-13 NOTE — ED Notes (Signed)
Pts son came to pick up the pt and "take her to a different medical facility". Pts son requesting information regarding check in times and "check out time". Explained to pts son that the pt is not discharged and that we encourage them to stay. Pts son requesting to take a picture of the pts chart as "proof" that the pt was here. Explained that due to HIPAA, any external photographic evidence of the pts chart is a violation of the law. Further explained to the pts son that all results of the pts visit including lab work and scans are available in the pts MyChart for them to see.

## 2021-12-13 NOTE — ED Notes (Signed)
Pt calling and texting for ride home

## 2021-12-13 NOTE — ED Triage Notes (Addendum)
Pt c/o headache and neck pain with nausea and photophobia x 4 days. Pt was seen at [facility] but medications there did not help.

## 2021-12-13 NOTE — Discharge Instructions (Signed)

## 2021-12-25 ENCOUNTER — Other Ambulatory Visit (HOSPITAL_COMMUNITY): Payer: Self-pay | Admitting: Physician Assistant

## 2021-12-25 DIAGNOSIS — F411 Generalized anxiety disorder: Secondary | ICD-10-CM

## 2021-12-25 DIAGNOSIS — F410 Panic disorder [episodic paroxysmal anxiety] without agoraphobia: Secondary | ICD-10-CM

## 2021-12-26 ENCOUNTER — Telehealth (HOSPITAL_COMMUNITY): Payer: Self-pay | Admitting: *Deleted

## 2021-12-26 NOTE — Telephone Encounter (Signed)
Provider was contacted by Glory Buff. Brenda Bowens, RN regarding patient's medication refill. Patient's medication to be e-prescribed to pharmacy of choice.

## 2021-12-26 NOTE — Telephone Encounter (Signed)
Call from patient stating she needs her Xanax. I reviewed the record, she has an appt on the 12th with provider. She has refills on the other meds. She states she doesn't have any Xanax and she last filled on the 9th. She is ok with her other meds. Will notify Eddie PA re her Xanax to see if he will escribe a rx for her before her appt on the 12th.

## 2021-12-28 ENCOUNTER — Telehealth (INDEPENDENT_AMBULATORY_CARE_PROVIDER_SITE_OTHER): Payer: Medicare Other | Admitting: Physician Assistant

## 2021-12-28 DIAGNOSIS — F909 Attention-deficit hyperactivity disorder, unspecified type: Secondary | ICD-10-CM | POA: Diagnosis not present

## 2021-12-28 DIAGNOSIS — F4010 Social phobia, unspecified: Secondary | ICD-10-CM | POA: Diagnosis not present

## 2021-12-28 DIAGNOSIS — F411 Generalized anxiety disorder: Secondary | ICD-10-CM

## 2021-12-28 MED ORDER — AMPHETAMINE-DEXTROAMPHETAMINE 20 MG PO TABS: 20.0000 mg | ORAL_TABLET | Freq: Two times a day (BID) | ORAL | 0 refills | Status: DC

## 2021-12-28 MED ORDER — GABAPENTIN 800 MG PO TABS: 800.0000 mg | ORAL_TABLET | Freq: Three times a day (TID) | ORAL | 2 refills | Status: DC
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
## 2021-12-31 ENCOUNTER — Encounter (HOSPITAL_COMMUNITY): Payer: Self-pay | Admitting: Physician Assistant

## 2022-03-09 ENCOUNTER — Other Ambulatory Visit (HOSPITAL_COMMUNITY): Payer: Self-pay | Admitting: Physician Assistant

## 2022-03-09 DIAGNOSIS — F909 Attention-deficit hyperactivity disorder, unspecified type: Secondary | ICD-10-CM

## 2022-03-26 ENCOUNTER — Other Ambulatory Visit (HOSPITAL_COMMUNITY): Payer: Self-pay | Admitting: Physician Assistant

## 2022-03-26 DIAGNOSIS — F410 Panic disorder [episodic paroxysmal anxiety] without agoraphobia: Secondary | ICD-10-CM

## 2022-03-26 DIAGNOSIS — F411 Generalized anxiety disorder: Secondary | ICD-10-CM

## 2022-03-29 ENCOUNTER — Encounter (HOSPITAL_COMMUNITY): Payer: Self-pay | Admitting: Physician Assistant

## 2022-03-29 ENCOUNTER — Telehealth (INDEPENDENT_AMBULATORY_CARE_PROVIDER_SITE_OTHER): Payer: Medicare HMO | Admitting: Physician Assistant

## 2022-03-29 DIAGNOSIS — F4010 Social phobia, unspecified: Secondary | ICD-10-CM | POA: Diagnosis not present

## 2022-03-29 DIAGNOSIS — F410 Panic disorder [episodic paroxysmal anxiety] without agoraphobia: Secondary | ICD-10-CM | POA: Diagnosis not present

## 2022-03-29 DIAGNOSIS — F411 Generalized anxiety disorder: Secondary | ICD-10-CM

## 2022-03-29 DIAGNOSIS — F909 Attention-deficit hyperactivity disorder, unspecified type: Secondary | ICD-10-CM | POA: Diagnosis not present

## 2022-03-29 MED ORDER — GABAPENTIN 800 MG PO TABS: 800.0000 mg | ORAL_TABLET | Freq: Three times a day (TID) | ORAL | 2 refills | Status: DC

## 2022-03-29 MED ORDER — AMPHETAMINE-DEXTROAMPHETAMINE 20 MG PO TABS: 20.0000 mg | ORAL_TABLET | Freq: Two times a day (BID) | ORAL | 0 refills | Status: DC
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
## 2022-06-14 ENCOUNTER — Telehealth (HOSPITAL_COMMUNITY): Payer: Self-pay | Admitting: *Deleted

## 2022-06-14 ENCOUNTER — Other Ambulatory Visit (HOSPITAL_COMMUNITY): Payer: Self-pay | Admitting: Psychiatry

## 2022-06-14 DIAGNOSIS — F411 Generalized anxiety disorder: Secondary | ICD-10-CM

## 2022-06-14 DIAGNOSIS — F410 Panic disorder [episodic paroxysmal anxiety] without agoraphobia: Secondary | ICD-10-CM

## 2022-06-14 MED ORDER — ALPRAZOLAM 1 MG PO TABS: 1.0000 mg | ORAL_TABLET | Freq: Two times a day (BID) | ORAL | 1 refills | Status: DC | PRN

## 2022-06-14 NOTE — Telephone Encounter (Signed)
Rx Refill Request:  ALPRAZolam (XANAX) 1 MG tablet TAKE 1 TABLET BY MOUTH TWICE  DAILY AS NEEDED FOR ANXIETY   Authorizing provider: [name], PA

## 2022-06-14 NOTE — Telephone Encounter (Signed)
Medication refilled and sent to preferred pharmacy

## 2022-06-15 ENCOUNTER — Telehealth (HOSPITAL_COMMUNITY): Payer: Self-pay | Admitting: *Deleted

## 2022-06-15 NOTE — Telephone Encounter (Signed)
Fax request from [place] [facility] requesting rx be sent in for her adderall. Per pharmacy she last filled on 05/16/22. She has a future appt on 06/28/22 with [place] PA. He is out of the office this week and next week. Will forward this request to his supervisor in his absence.

## 2022-06-22 ENCOUNTER — Other Ambulatory Visit (HOSPITAL_COMMUNITY): Payer: Self-pay | Admitting: Physician Assistant

## 2022-06-22 ENCOUNTER — Telehealth (HOSPITAL_COMMUNITY): Payer: Self-pay | Admitting: Psychiatry

## 2022-06-22 NOTE — Telephone Encounter (Signed)
Please refer this message to Armando Reichert, MD.

## 2022-06-25 ENCOUNTER — Telehealth (INDEPENDENT_AMBULATORY_CARE_PROVIDER_SITE_OTHER): Payer: Medicare HMO | Admitting: Psychiatry

## 2022-06-25 DIAGNOSIS — F900 Attention-deficit hyperactivity disorder, predominantly inattentive type: Secondary | ICD-10-CM | POA: Diagnosis not present

## 2022-06-25 DIAGNOSIS — F411 Generalized anxiety disorder: Secondary | ICD-10-CM | POA: Diagnosis not present

## 2022-06-25 DIAGNOSIS — F4010 Social phobia, unspecified: Secondary | ICD-10-CM | POA: Diagnosis not present

## 2022-06-25 DIAGNOSIS — F909 Attention-deficit hyperactivity disorder, unspecified type: Secondary | ICD-10-CM

## 2022-06-25 DIAGNOSIS — F410 Panic disorder [episodic paroxysmal anxiety] without agoraphobia: Secondary | ICD-10-CM

## 2022-06-25 MED ORDER — AMPHETAMINE-DEXTROAMPHETAMINE 20 MG PO TABS: 20.0000 mg | ORAL_TABLET | Freq: Two times a day (BID) | ORAL | 0 refills | Status: DC

## 2022-06-25 MED ORDER — GABAPENTIN 800 MG PO TABS: 800.0000 mg | ORAL_TABLET | Freq: Three times a day (TID) | ORAL | 2 refills | Status: DC
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
## 2022-06-26 ENCOUNTER — Encounter (HOSPITAL_COMMUNITY): Payer: Self-pay | Admitting: Psychiatry

## 2022-06-28 ENCOUNTER — Telehealth (HOSPITAL_COMMUNITY): Payer: Medicare HMO | Admitting: Physician Assistant

## 2022-07-24 ENCOUNTER — Ambulatory Visit (HOSPITAL_COMMUNITY)
Admission: EM | Admit: 2022-07-24 | Discharge: 2022-07-25 | Disposition: A | Discharge status: Home / Self Care | Payer: Medicare HMO | Attending: Nurse Practitioner | Admitting: Nurse Practitioner

## 2022-07-24 DIAGNOSIS — F319 Bipolar disorder, unspecified: Secondary | ICD-10-CM | POA: Insufficient documentation

## 2022-07-24 DIAGNOSIS — K5900 Constipation, unspecified: Secondary | ICD-10-CM | POA: Diagnosis not present

## 2022-07-24 DIAGNOSIS — F900 Attention-deficit hyperactivity disorder, predominantly inattentive type: Secondary | ICD-10-CM | POA: Insufficient documentation

## 2022-07-24 DIAGNOSIS — Z79899 Other long term (current) drug therapy: Secondary | ICD-10-CM | POA: Diagnosis not present

## 2022-07-24 DIAGNOSIS — F410 Panic disorder [episodic paroxysmal anxiety] without agoraphobia: Secondary | ICD-10-CM | POA: Diagnosis not present

## 2022-07-24 DIAGNOSIS — F411 Generalized anxiety disorder: Secondary | ICD-10-CM | POA: Insufficient documentation

## 2022-07-24 DIAGNOSIS — Z20822 Contact with and (suspected) exposure to covid-19: Secondary | ICD-10-CM | POA: Insufficient documentation

## 2022-07-24 DIAGNOSIS — K30 Functional dyspepsia: Secondary | ICD-10-CM | POA: Insufficient documentation

## 2022-07-24 DIAGNOSIS — F4322 Adjustment disorder with anxiety: Secondary | ICD-10-CM | POA: Diagnosis present

## 2022-07-24 DIAGNOSIS — F4010 Social phobia, unspecified: Secondary | ICD-10-CM | POA: Diagnosis not present

## 2022-07-24 DIAGNOSIS — R443 Hallucinations, unspecified: Secondary | ICD-10-CM | POA: Insufficient documentation

## 2022-07-24 DIAGNOSIS — Z046 Encounter for general psychiatric examination, requested by authority: Secondary | ICD-10-CM | POA: Diagnosis not present

## 2022-07-24 LAB — POCT URINE DRUG SCREEN - MANUAL ENTRY (I-SCREEN)
POC Amphetamine UR: NOT DETECTED
POC Buprenorphine (BUP): NOT DETECTED
POC Cocaine UR: NOT DETECTED
POC Marijuana UR: POSITIVE — AB
POC Methadone UR: NOT DETECTED
POC Methamphetamine UR: NOT DETECTED
POC Morphine: NOT DETECTED
POC Oxazepam (BZO): POSITIVE — AB
POC Oxycodone UR: NOT DETECTED
POC Secobarbital (BAR): NOT DETECTED

## 2022-07-24 LAB — POCT PREGNANCY, URINE: Preg Test, Ur: NEGATIVE

## 2022-07-24 LAB — POC SARS CORONAVIRUS 2 AG
SARSCOV2ONAVIRUS 2 AG: NEGATIVE
SARSCOV2ONAVIRUS 2 AG: NEGATIVE

## 2022-07-24 MED ORDER — HYDROXYZINE HCL 25 MG PO TABS
25.0000 mg | ORAL_TABLET | Freq: Three times a day (TID) | ORAL | Status: DC | PRN
  Administered 2022-07-24: 25 mg via ORAL
  Filled 2022-07-24: qty 1

## 2022-07-24 MED ORDER — ACETAMINOPHEN 325 MG PO TABS: 650.0000 mg | ORAL_TABLET | Freq: Four times a day (QID) | ORAL | Status: DC | PRN

## 2022-07-24 MED ORDER — MAGNESIUM HYDROXIDE 400 MG/5ML PO SUSP: 30.0000 mL | Freq: Every day | ORAL | Status: DC | PRN

## 2022-07-24 MED ORDER — GABAPENTIN 400 MG PO CAPS
800.0000 mg | ORAL_CAPSULE | Freq: Three times a day (TID) | ORAL | Status: DC
  Administered 2022-07-24 – 2022-07-25 (×2): 800 mg via ORAL
  Filled 2022-07-24 (×2): qty 2

## 2022-07-24 MED ORDER — ALUM & MAG HYDROXIDE-SIMETH 200-200-20 MG/5ML PO SUSP: 30.0000 mL | ORAL | Status: DC | PRN

## 2022-07-24 MED ORDER — ADULT MULTIVITAMIN W/MINERALS CH
1.0000 | ORAL_TABLET | Freq: Every day | ORAL | Status: DC
  Administered 2022-07-25: 1 via ORAL
  Filled 2022-07-24: qty 1

## 2022-07-24 MED ORDER — AMPHETAMINE-DEXTROAMPHETAMINE 10 MG PO TABS
20.0000 mg | ORAL_TABLET | Freq: Two times a day (BID) | ORAL | Status: DC
  Administered 2022-07-25: 20 mg via ORAL
  Filled 2022-07-24: qty 2
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
## 2022-07-25 DIAGNOSIS — F4322 Adjustment disorder with anxiety: Secondary | ICD-10-CM | POA: Diagnosis not present

## 2022-07-25 DIAGNOSIS — F411 Generalized anxiety disorder: Secondary | ICD-10-CM | POA: Diagnosis not present

## 2022-07-25 DIAGNOSIS — F4010 Social phobia, unspecified: Secondary | ICD-10-CM

## 2022-07-25 DIAGNOSIS — F900 Attention-deficit hyperactivity disorder, predominantly inattentive type: Secondary | ICD-10-CM

## 2022-07-25 DIAGNOSIS — F410 Panic disorder [episodic paroxysmal anxiety] without agoraphobia: Secondary | ICD-10-CM

## 2022-07-25 DIAGNOSIS — Z046 Encounter for general psychiatric examination, requested by authority: Secondary | ICD-10-CM

## 2022-07-25 LAB — CBC WITH DIFFERENTIAL/PLATELET
Abs Immature Granulocytes: 0.02 10*3/uL (ref 0.00–0.07)
Basophils Absolute: 0 10*3/uL (ref 0.0–0.1)
Basophils Relative: 1 %
Eosinophils Absolute: 0 10*3/uL (ref 0.0–0.5)
Eosinophils Relative: 0 %
HCT: 38.5 % (ref 36.0–46.0)
Hemoglobin: 13.7 g/dL (ref 12.0–15.0)
Immature Granulocytes: 0 %
Lymphocytes Relative: 28 %
Lymphs Abs: 2.2 10*3/uL (ref 0.7–4.0)
MCH: 31.5 pg (ref 26.0–34.0)
MCHC: 35.6 g/dL (ref 30.0–36.0)
MCV: 88.5 fL (ref 80.0–100.0)
Monocytes Absolute: 0.8 10*3/uL (ref 0.1–1.0)
Monocytes Relative: 10 %
Neutro Abs: 4.8 10*3/uL (ref 1.7–7.7)
Neutrophils Relative %: 61 %
Platelets: 295 10*3/uL (ref 150–400)
RBC: 4.35 MIL/uL (ref 3.87–5.11)
RDW: 12.7 % (ref 11.5–15.5)
WBC: 7.9 10*3/uL (ref 4.0–10.5)
nRBC: 0 % (ref 0.0–0.2)

## 2022-07-25 LAB — COMPREHENSIVE METABOLIC PANEL
ALT: 20 U/L (ref 0–44)
AST: 18 U/L (ref 15–41)
Albumin: 4.4 g/dL (ref 3.5–5.0)
Alkaline Phosphatase: 83 U/L (ref 38–126)
Anion gap: 8 (ref 5–15)
BUN: 9 mg/dL (ref 6–20)
CO2: 27 mmol/L (ref 22–32)
Calcium: 9.9 mg/dL (ref 8.9–10.3)
Chloride: 103 mmol/L (ref 98–111)
Creatinine, Ser: 0.66 mg/dL (ref 0.44–1.00)
GFR, Estimated: >60 mL/min (ref >60)
Glucose, Bld: 91 mg/dL (ref 70–99)
Potassium: 3.6 mmol/L (ref 3.5–5.1)
Sodium: 138 mmol/L (ref 135–145)
Total Bilirubin: 0.9 mg/dL (ref 0.3–1.2)
Total Protein: 7.2 g/dL (ref 6.5–8.1)

## 2022-07-25 LAB — TSH: TSH: 1.469 u[IU]/mL (ref 0.350–4.500)

## 2022-07-25 LAB — RESP PANEL BY RT-PCR (FLU A&B, COVID) ARPGX2
Influenza A by PCR: NEGATIVE
Influenza B by PCR: NEGATIVE
SARS Coronavirus 2 by RT PCR: NEGATIVE

## 2022-07-25 LAB — HEMOGLOBIN A1C
Hgb A1c MFr Bld: 5.3 % (ref 4.8–5.6)
Mean Plasma Glucose: 105.41 mg/dL

## 2022-07-25 LAB — LIPID PANEL
Cholesterol: 220 mg/dL — ABNORMAL HIGH (ref 0–200)
HDL: 76 mg/dL (ref >40)
LDL Cholesterol: 133 mg/dL — ABNORMAL HIGH (ref 0–99)
Total CHOL/HDL Ratio: 2.9 RATIO
Triglycerides: 57 mg/dL (ref <150)
VLDL: 11 mg/dL (ref 0–40)

## 2022-07-25 MED ORDER — AMPHETAMINE-DEXTROAMPHETAMINE 10 MG PO TABS: 20.0000 mg | ORAL_TABLET | Freq: Every day | ORAL | Status: DC

## 2022-07-25 NOTE — BH Assessment (Signed)
LCSW Progress Note   LCSW spoke with the pt to get information on her preferences and explain the PHP/IOP treatment programs to her.  Pt appeared to be passive, agitated, and not forthcoming on what she wanted for herself. "It doesn't matter what I want."  Per Beatriz Stallion, FNP, this pt does not require psychiatric hospitalization at this time.  Pt is psychiatrically cleared.  Discharge instructions include appointment date and time for an assessment for PHP/IOP, several outpatient providers for therapy and medication management, and community resources for support.  EDP Beatriz Stallion, FNP, has been notified.  Omelia Blackwater, MSW, [place] [phone] or [phone]

## 2022-07-25 NOTE — Progress Notes (Signed)
   07/24/22 2334  Real (Walk-ins at [facility] only)  How Did You Hear About [place]? Other (Comment) (IVC)  What Is the Reason for Your Visit/Call Today? [name] is a 51 year old female presenting under IVC to [facility] due to hallucinations. Patient denied SI, HI, psychosis and alcohol/drug usage. Patient reports recently moving in with her son and not being able to adjust to all the noise in her new environment. Patient reported there are weird sounds coming from outside and that her cat even thinks the noises are strange. Patient denied depressive symptoms. Patient reported only increased anxiety and stress living in the new surroundings. Patient reported history of psych hospitalizations and suicide attempt, however when asked about timeframe, patient stated "don't know". Patient is currently being seen for outpatient services at [facility], however patient denied being on any psych medications. Patient reported varied sleeping patterns and normal appetite.     IVC, petitioner, daughter, [name], [phone]  Respondent has been diagnosed with manic depression, bipolar, anxiety and psychosis. Respondent has been prescribed gabapentin, Xanax, Adderall, and Abilify respondent has been committed twice in the last 2 years, Respondent is hallucination, Respondent is talking to self, thinks TV and phone are listening to her. Respondent was found outside the home this morning. Respondent is not eating, seeing things that ER not there and thinks she is being watched. Respondent is a danger to herself.  How Long Has This Been Causing You Problems? <Week  Have You Recently Had Any Thoughts About Hurting Yourself? No  Are You Planning to Commit Suicide/Harm Yourself At This time? No  Have you Recently Had Thoughts About [place]? No  Are You Planning To Harm Someone At This Time? No  Are you currently experiencing any auditory, visual or other  hallucinations? No  Have You Used Any Alcohol or Drugs in the Past 24 Hours? No  Do you have any current medical co-morbidities that require immediate attention? No  Clinician description of patient physical appearance/behavior: casual / cooperative  What Do You Feel Would Help You the Most Today? Treatment for Depression or other mood problem  If access to [facility] was not available, would you have sought care in the Emergency Department? Yes  Determination of Need Urgent (48 hours)  Options For Referral Outpatient Therapy;Medication Management;Other: Comment (observation)
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
## 2022-07-25 NOTE — Discharge Instructions (Addendum)
Good afternoon, [name]!  Thank you for letting us serve you.  A referral has been made to the facilitators of the PHP/IOP:  You are scheduled for an assessment for the PHP on  07/27/22 @ 10a. This appointment will last approximately one hour and will be virtual via Webex. PHP is virtual group therapy that runs Mon-Fri from 9am-1pm. Please download the [employer] app prior to the appointment. If you need to cancel or reschedule, please call [phone].  Listed below are other outpatient resources for therapy and medication management that serve Medicare recipients. Also, if you look below, there are a few community resources that could be beneficial to your independence, self-sufficiency, and over all well-being such as the Soldier, etc.  In case of an urgent emergency, you have the option of contacting the Mobile Crisis Unit with [facility] at [phone].  You can also dial 211 or go to www.nc211.org to research hundreds of community resources in this area and around [place] that could be of benefits to you for basic needs, healthcare, mental health/substance use, legal, employment, etc.   [employer] of [place], [place], [address] [phone] phone  The [place] has services such as the Buyer, retail where you can get education and free consultation from local attorneys in 20 areas of law, or the Women to Work Program if that is something you are interested in.  The [employer] also offers [employer] Resources and [place] for Archivist.  The greatest benefit is having the support of other women who will understand your needs.   [address] [place], [place], [address] [phone] phone  Many nonprofits in this sector focus on a single  disability or a single aspect of a person's life. Here at [facility], we take a whole-person approach in less of a medical or judicial way than a traditional case worker. We work with the individual to determine all the factors of the social determinants of health that would improve overall livelihood and ability to live independently. Our goal is not just independency but a fulfilled life with the rights of success, enjoyment, and productivity deserved by every individual regardless of disability and any societal barriers.   Our five core services are: Information & Referrals, Systems & Individual Advocacy, Independent Living Skills Training, Institutional & Youth Transitions Support, and Peer Counseling. Contact us today to learn more.  Outpatient Therapy and Psychiatry for Medicare Recipients  [facility] [address] [place], [place], [address] [phone] phone  [facility] [address], [place], [address] [phone] phone [phone] fax  [facility] [address] [place], [place], [address] [phone] phone  [facility] [address] [place], [place], [address] [phone] phone  [place] [address] [place] [place] [place], [place], [address] [phone] phone [phone] fax  [facility] [place] [address], [place], [address] [phone] phone  [facility] [address] [place] [place], [place], [address] [phone] phone [phone] fax  [facility] [address] [place], [place], [address] [phone] phone  [name] [address] [place], [place], [address] [phone] phone  [facility] [place] [employer]. [place], [place], [address] [phone] phone [phone] fax    There are  agencies out there that offer services that you may be  eligible for.   Conservator, museum/gallery at [facility]  [address], [place], [place] [address] [phone] phone  Go online to [email or web address] - Click on [place] and Services - Residential Options - Look to the right side of the screen and click on "Information Request Form." - Complete the information request form and be sure to have a solid contact number for them to reach you at.  You can also go to the physical office and inquire about availability in [place] and [place] for adults with mental illness on disability.  It would also behoove you to establish medication management, therapy, or see if you can have an Actuary (ACTT) assigned to you that can give you all of that in one team.  With ACTT, they will meet you in the community and assist with wrap-around services to make sure you maintain your goals with mental health and independent living.   You have the option to apply at [place] for Section 8 housing even if there is a waiting list.  HUD will usually have a list of subsidized housing available in the area that you can go to on your own and apply for a lease.  [place] [place], [place] [address] [phone] phone  [place] [address] [place], [place], [address] Some 1-bedrooms open  You will have to leave a voice message as the individual you need to speak with is working in a different area today.    To see which pharmacy near you is the CHEAPEST for certain medications, please use GoodRx. It is free website and has a free phone app.       Also consider looking at [facility] $9.62 or Public's $9.52 prescription list. Both are free to view if googled "[facility] $4 prescription" and "public's $7 prescription". These are set prices, no insurance required.   For issues with sleep, please use this free app  for insomnia called CBT-I. Let your doctors and therapists know so they can help with extra tips and tricks or for guidance and accountability. NO ADDS on the app.     For therapy outside the hospital, please ask for these specific types of therapy: CBT   Please make regular appointments with an outpatient psychiatrist and other doctors once you leave the hospital.    Suicide hotline: 988 Emergency: [place] [address] [place], [place], [address] [phone] phone   Check to see if you are eligible for [facility]ist @ [email or web address] or call [phone].  Patient is instructed prior to discharge to:  Take all medications as prescribed by his/her mental healthcare provider. Report any adverse effects and or reactions from the medicines to his/her outpatient provider promptly. Keep all scheduled appointments, to ensure that you are getting refills on time and to avoid any interruption in your medication.  If you are unable to keep an appointment call to reschedule.  Be sure to follow-up with resources and follow-up appointments provided.  Patient has been instructed & cautioned: To not engage in alcohol and or illegal drug use while on prescription medicines. In the event of worsening symptoms, patient is instructed to call the crisis hotline, 911 and or go to the nearest ED for appropriate evaluation and treatment of symptoms. To  follow-up with his/her primary care provider for your other medical issues, concerns and or health care needs.

## 2022-07-25 NOTE — ED Notes (Signed)
Discharge instructions provided and Pt stated understanding. Pt alert, orient and ambulatory prior to d/c from facility. Personal belongings returned from locker. [employer] non-emergency called for transportation services to Pt's home. Pt escorted to the sally port at time of departure from facility. Safety maintained.
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
## 2022-07-25 NOTE — ED Notes (Signed)
Pt admitted to obs under IVC for hallucinations. Pt A&O x4, cooperative but irritable. Pt denies current SI/HI/AVH. Pt tolerated lab work and skin assessment well. Pt ambulated independently to unit. Oriented to unit/staff. Medications administered without difficulty. Pt declined offer for meal, water given. No signs of distress noted. Monitoring for safety.

## 2022-07-25 NOTE — ED Notes (Signed)
Pt resting in bed. Respirations even and unlabored. Monitoring for safety.

## 2022-07-25 NOTE — ED Notes (Signed)
[facility] Non-Emergency Transportation called for services to take Pt back home. IVC has been rescinded per provider.

## 2022-07-27 ENCOUNTER — Other Ambulatory Visit (HOSPITAL_COMMUNITY): Payer: Medicare HMO | Attending: Licensed Clinical Social Worker

## 2022-07-27 ENCOUNTER — Encounter (HOSPITAL_COMMUNITY): Payer: Self-pay

## 2022-07-27 ENCOUNTER — Telehealth (HOSPITAL_COMMUNITY): Payer: Self-pay | Admitting: Professional

## 2022-08-07 ENCOUNTER — Ambulatory Visit (HOSPITAL_COMMUNITY)
Admission: EM | Admit: 2022-08-07 | Discharge: 2022-08-07 | Disposition: A | Discharge status: Home / Self Care | Payer: Medicare HMO | Attending: Nurse Practitioner | Admitting: Nurse Practitioner

## 2022-08-07 DIAGNOSIS — M546 Pain in thoracic spine: Secondary | ICD-10-CM | POA: Insufficient documentation

## 2022-08-07 DIAGNOSIS — T733XXA Exhaustion due to excessive exertion, initial encounter: Secondary | ICD-10-CM | POA: Diagnosis not present

## 2022-08-07 DIAGNOSIS — R5383 Other fatigue: Secondary | ICD-10-CM | POA: Diagnosis present

## 2022-08-07 NOTE — ED Provider Notes (Signed)
Behavioral Health Urgent Care Medical Screening Exam  Patient Name: [name] MRN: [id number] Date of Evaluation: 08/07/22 Chief Complaint: " I slept on the floor, I don't have a mattress".   Diagnosis:  Final diagnoses:  Fatigue due to excessive exertion, initial encounter    History of Present illness: [name] is a 51 y.o. female with past psychiatric history significant for attention deficit hyperactivity disorder (unspecified ADHD type), generalized anxiety disorder, social anxiety disorder, and panic disorder, who was brought in by [facility] voluntarily to [facility] with complaints of upper back pain from sleeping on the floor x 1 week because she did not have a mattress.  During this evaluation, the patient is minimally cooperative.  On how she got here today the patient reports I do not know, I just asked for help to speak to someone. On what type of help is she seeking today, the patient patient reports " I don't know, I slept on the floor".   When asked what her stressors are today? the patient reports "I am saying, that I just moved into a place,and I bought a bunch of stuff for my house, and I have slept on the floor, I don't have a mattress, and I've tossed and turned for like a week, I don't know, not long".    Patient reports "I have a lot of upper back pain, from going up and down the stairs moving stuff, standing up for long, and just using my legs".   Patient reports "I don't know what I am doing here, I just want to go back home and get my pain medicines".  Patient denies suicidal ideations, denies homicidal ideations, denies audio and visual hallucinations.  Patient reports her sleep is poor because she is currently sleeping on the floor, patient reports her appetite as fair.  Patient denies illicit drug use, denies alcohol use.  Patient reports she takes some psychiatric medications prescribed by an outpatient provider. Patient reports she manages her pain with gabapentin  which she takes 3 times a day.  Patient reports she lives with her daughter [name] and her cat.  Patient denies access or means to weapons at home.  Support and encouragement and reassurance provided about ongoing stressors.  Patient provided with opportunity for questions.  On evaluation, patient is alert, oriented x 3, and minimally cooperative. Speech is clear, coherent and logical. Pt appears casual. Eye contact is fair. Mood is euthymic, affect is congruent with mood. Thought process and thought content is coherent. Pt denies SI/HI/AVH. There is no indication that the patient is responding to internal stimuli. No delusions elicited during this assessment.    Psychiatric Specialty Exam  Presentation  General Appearance:Casual  Eye Contact:Fair  Speech:Clear and Coherent  Speech Volume:Normal  Handedness:Right   Mood and Affect  Mood:Euthymic  Affect:Congruent   Thought Process  Thought Processes:Coherent  Descriptions of Associations:Intact  Orientation:Full (Time, Place and Person)  Thought Content:WDL  Diagnosis of Schizophrenia or Schizoaffective disorder in past: No data recorded Duration of Psychotic Symptoms: No data recorded Hallucinations:None  Ideas of Reference:None  Suicidal Thoughts:No  Homicidal Thoughts:No   Sensorium  Memory:Immediate Good; Recent Fair  Judgment:Fair  Insight:Fair   Executive Functions  Concentration:Fair  Attention Span:Fair  [place]   Psychomotor Activity  Psychomotor Activity:Normal   Assets  Assets:Communication Skills; Desire for Improvement; Housing; Social Support   Sleep  Sleep:Fair  Number of hours: No data recorded  Nutritional Assessment (For OBS and  FBC admissions only) Has the patient had a weight loss or gain of 10 pounds or more in the last 3 months?: No Has the patient had a decrease in food intake/or appetite?: No Does the patient have  dental problems?: No Does the patient have eating habits or behaviors that may be indicators of an eating disorder including binging or inducing vomiting?: No Has the patient recently lost weight without trying?: 0 Has the patient been eating poorly because of a decreased appetite?: 0 Malnutrition Screening Tool Score: 0    Physical Exam: Physical Exam Constitutional:      Appearance: She is not toxic-appearing or diaphoretic.  HENT:     Head: Normocephalic.     Right Ear: External ear normal.     Left Ear: External ear normal.     Nose: No congestion.  Eyes:     General:        Right eye: No discharge.        Left eye: No discharge.  Cardiovascular:     Rate and Rhythm: Normal rate.  Pulmonary:     Effort: No respiratory distress.  Chest:     Chest wall: No tenderness.  Neurological:     Mental Status: She is alert and oriented to person, place, and time.  Psychiatric:        Attention and Perception: Attention and perception normal.        Mood and Affect: Mood and affect normal.        Speech: Speech normal.        Behavior: Behavior normal.        Thought Content: Thought content normal. Thought content is not paranoid or delusional. Thought content does not include homicidal or suicidal ideation. Thought content does not include homicidal or suicidal plan.        Cognition and Memory: Cognition normal.        Judgment: Judgment normal.    Review of Systems  Constitutional:  Negative for chills and fever.  HENT:  Negative for congestion.   Eyes:  Negative for discharge.  Respiratory:  Negative for cough, shortness of breath and wheezing.   Cardiovascular:  Negative for chest pain and palpitations.  Gastrointestinal:  Negative for diarrhea, nausea and vomiting.  Musculoskeletal:  Positive for back pain.  Neurological:  Negative for dizziness, seizures, loss of consciousness, weakness and headaches.  Psychiatric/Behavioral:  Negative for depression, hallucinations,  memory loss, substance abuse and suicidal ideas. The patient is not nervous/anxious and does not have insomnia.    Blood pressure (!) 137/112, pulse 94, temperature 98.4 F (36.9 C), temperature source Oral, resp. rate 18, SpO2 98 %. There is no height or weight on file to calculate BMI.  Musculoskeletal: Strength & Muscle Tone: within normal limits Gait & Station: normal Patient leans: N/A   [place] MSE Discharge Disposition for Follow up and Recommendations: Based on my evaluation the patient does not appear to have an emergency medical condition and can be discharged with resources and follow up care in outpatient services for Medication Management and Individual Therapy  Recommend  Pt be discharged to home.  Recommend continue home medications as prescribed. There is no evidence of imminent risk of harm to self or others at this time.   Condition at discharge is stable.   Randon Goldsmith, NP 08/07/2022, 10:39 PM

## 2022-08-07 NOTE — BH Assessment (Addendum)
Comprehensive Clinical Assessment (CCA) Screening, Triage and Referral Note  08/07/2022 [name] [id number]  Triage/Screening completed. Patient is Routine. Patient is in Room 136. MSE pending.      Chief Complaint: Body Pain from sleeping on the floor of her new home and Anxiety  Visit Diagnosis: Anxiety  Patient Reported Information How did you hear about [place]? Legal System  What Is the Reason for Your Visit/Call Today?  [name] is a 51 year old female presenting voluntarily to [facility]. She was transported via [employer]. Patient's complaint is that she recently moved into a new apartment, slept on the floor because she doesn't have a mattress, and now experiencing a lot of pain. When asked if she has a mental health history patient says, "I don't know". States that she takes medications for anxiety. She is guarded in all her responses and a poor historian. Denies SI, HI, AVH's, alcohol/drug use. No sleeping well x1 day. Appetite is fair.  Denies that she has outpatient therapeutic services. Prescribed Xanax, Adderall, and Gabapentin. Appearance is disheveled. Patient also, restless and irritable throughout the screening/triage. She is asking to leave.   How Long Has This Been Causing You Problems? <Week  What Do You Feel Would Help You the Most Today? Treatment for Depression or other mood problem; Social Support; Medication(s)   Have You Recently Had Any Thoughts About Hurting Yourself? No  Are You Planning to Commit Suicide/Harm Yourself At This time? No   Have you Recently Had Thoughts About [place]? No  Are You Planning to Harm Someone at This Time? No  Explanation: No data recorded  Have You Used Any Alcohol or Drugs in the Past 24 Hours? No  How Long Ago Did You Use Drugs or Alcohol? No data recorded What Did You Use and How Much? No data recorded  Do You Currently Have a Therapist/Psychiatrist? Yes  Name of  Therapist/Psychiatrist: [name]   Have You Been Recently Discharged From Any Office Practice or Programs? No  Explanation of Discharge From Practice/Program: No data recorded   CCA Screening Triage Referral Assessment Type of Contact: Face-to-Face  Telemedicine Service Delivery:   Is this Initial or Reassessment? Initial Assessment  Date Telepsych consult ordered in CHL:  No data recorded Time Telepsych consult ordered in CHL:  No data recorded [facility] of [facility] Assessment Services  Provider Location: [facility] Assessment Services   Collateral Involvement: [name], daughter, [phone]   Does Patient Have a [place]? No data recorded Name and Contact of Legal Guardian: No data recorded If Minor and Not Living with Parent(s), Who has Custody? No data recorded Is CPS involved or ever been involved? No data recorded Is APS involved or ever been involved? No data recorded  Patient Determined To Be At Risk for Harm To Self or Others Based on Review of Patient Reported Information or Presenting Complaint? No data recorded Method: No data recorded Availability of Means: No data recorded Intent: No data recorded Notification Required: No data recorded Additional Information for Danger to Others Potential: No data recorded Additional Comments for Danger to Others Potential: No data recorded Are There Guns or Other Weapons in Your Home? No data recorded Types of Guns/Weapons: No data recorded Are These Weapons Safely Secured?                            No data recorded Who  Could Verify You Are Able To Have These Secured: No data recorded Do You Have any Outstanding Charges, Pending Court Dates, Parole/Probation? No data recorded Contacted To Inform of Risk of Harm To Self or Others: No data recorded  Does Patient Present under Involuntary Commitment? Yes  IVC Papers Initial File Date: 07/24/22   [place]  of Residence: [place]   Patient Currently Receiving the Following Services: Not Receiving Services   Determination of Need: Urgent (48 hours)   Options For Referral: Medication Management; Outpatient Therapy; Other: Comment   Discharge Disposition: Patient stating that she does not want to stay and doesn't want services. She requested to leave. Denies SI, HI, and AVH's. Therefore, the [facility] provider Jordan Hawks, NP) determined that patient is ok to discharge.     Waldon Merl, Counselor

## 2022-08-07 NOTE — ED Notes (Signed)
[employer] Dispatch contacted for transport home.

## 2022-08-07 NOTE — Discharge Instructions (Addendum)
  Discharge recommendations:  Patient is to take medications as prescribed. Please see information for follow-up appointment with psychiatry and therapy. Please follow up with your primary care provider for all medical related needs.   Therapy: We recommend that patient participate in individual therapy to address mental health concerns.  Medications: The parent/guardian is to contact a medical professional and/or outpatient provider to address any new side effects that develop. Parent/guardian should update outpatient providers of any new medications and/or medication changes.   Atypical antipsychotics: If you are prescribed an atypical antipsychotic, it is recommended that your height, weight, BMI, blood pressure, fasting lipid panel, and fasting blood sugar be monitored by your outpatient providers.  Safety:  The patient should abstain from use of illicit substances/drugs and abuse of any medications. If symptoms worsen or do not continue to improve or if the patient becomes actively suicidal or homicidal then it is recommended that the patient return to the closest hospital emergency department, the [facility], or call 911 for further evaluation and treatment. National Suicide Prevention Lifeline 1-800-SUICIDE or 236-193-0341.  About 988 988 offers 24/7 access to trained crisis counselors who can help people experiencing mental health-related distress. People can call or text 988 or chat 988lifeline.org for themselves or if they are worried about a loved one who may need crisis support.  Crisis Mobile: [facility]:                     986-773-1813 (for crisis response 24 hours a day) [place]:                                            (218) 110-5212

## 2022-08-08 ENCOUNTER — Ambulatory Visit (HOSPITAL_COMMUNITY)
Admission: EM | Admit: 2022-08-08 | Discharge: 2022-08-09 | Disposition: A | Discharge status: Other Acute Inpatient Hospital | Payer: Medicare HMO | Attending: Psychiatry | Admitting: Psychiatry

## 2022-08-08 DIAGNOSIS — Z9151 Personal history of suicidal behavior: Secondary | ICD-10-CM | POA: Insufficient documentation

## 2022-08-08 DIAGNOSIS — Z79899 Other long term (current) drug therapy: Secondary | ICD-10-CM | POA: Diagnosis not present

## 2022-08-08 DIAGNOSIS — F1290 Cannabis use, unspecified, uncomplicated: Secondary | ICD-10-CM | POA: Diagnosis not present

## 2022-08-08 DIAGNOSIS — Z560 Unemployment, unspecified: Secondary | ICD-10-CM | POA: Insufficient documentation

## 2022-08-08 DIAGNOSIS — F419 Anxiety disorder, unspecified: Secondary | ICD-10-CM | POA: Diagnosis not present

## 2022-08-08 DIAGNOSIS — R410 Disorientation, unspecified: Secondary | ICD-10-CM | POA: Insufficient documentation

## 2022-08-08 DIAGNOSIS — F411 Generalized anxiety disorder: Secondary | ICD-10-CM | POA: Insufficient documentation

## 2022-08-08 DIAGNOSIS — R462 Strange and inexplicable behavior: Secondary | ICD-10-CM | POA: Insufficient documentation

## 2022-08-08 DIAGNOSIS — Z20822 Contact with and (suspected) exposure to covid-19: Secondary | ICD-10-CM | POA: Diagnosis not present

## 2022-08-08 LAB — POCT URINE DRUG SCREEN - MANUAL ENTRY (I-SCREEN)
POC Amphetamine UR: NOT DETECTED
POC Buprenorphine (BUP): NOT DETECTED
POC Cocaine UR: NOT DETECTED
POC Marijuana UR: POSITIVE — AB
POC Methadone UR: NOT DETECTED
POC Methamphetamine UR: NOT DETECTED
POC Morphine: NOT DETECTED
POC Oxazepam (BZO): POSITIVE — AB
POC Oxycodone UR: NOT DETECTED
POC Secobarbital (BAR): NOT DETECTED

## 2022-08-08 LAB — URINALYSIS, ROUTINE W REFLEX MICROSCOPIC
Bilirubin Urine: NEGATIVE
Glucose, UA: NEGATIVE mg/dL
Hgb urine dipstick: NEGATIVE
Ketones, ur: 5 mg/dL — AB
Nitrite: NEGATIVE
Protein, ur: NEGATIVE mg/dL
Specific Gravity, Urine: 1.006 (ref 1.005–1.030)
pH: 6 (ref 5.0–8.0)

## 2022-08-08 LAB — COMPREHENSIVE METABOLIC PANEL
ALT: 19 U/L (ref 0–44)
AST: 24 U/L (ref 15–41)
Albumin: 5.1 g/dL — ABNORMAL HIGH (ref 3.5–5.0)
Alkaline Phosphatase: 73 U/L (ref 38–126)
Anion gap: 10 (ref 5–15)
BUN: <5 mg/dL — ABNORMAL LOW (ref 6–20)
CO2: 29 mmol/L (ref 22–32)
Calcium: 10.6 mg/dL — ABNORMAL HIGH (ref 8.9–10.3)
Chloride: 97 mmol/L — ABNORMAL LOW (ref 98–111)
Creatinine, Ser: 0.74 mg/dL (ref 0.44–1.00)
GFR, Estimated: >60 mL/min (ref >60)
Glucose, Bld: 110 mg/dL — ABNORMAL HIGH (ref 70–99)
Potassium: 3.2 mmol/L — ABNORMAL LOW (ref 3.5–5.1)
Sodium: 136 mmol/L (ref 135–145)
Total Bilirubin: 0.9 mg/dL (ref 0.3–1.2)
Total Protein: 8 g/dL (ref 6.5–8.1)

## 2022-08-08 LAB — CBC WITH DIFFERENTIAL/PLATELET
Abs Immature Granulocytes: 0.03 10*3/uL (ref 0.00–0.07)
Basophils Absolute: 0 10*3/uL (ref 0.0–0.1)
Basophils Relative: 0 %
Eosinophils Absolute: 0 10*3/uL (ref 0.0–0.5)
Eosinophils Relative: 0 %
HCT: 41.7 % (ref 36.0–46.0)
Hemoglobin: 15 g/dL (ref 12.0–15.0)
Immature Granulocytes: 0 %
Lymphocytes Relative: 20 %
Lymphs Abs: 1.8 10*3/uL (ref 0.7–4.0)
MCH: 31.6 pg (ref 26.0–34.0)
MCHC: 36 g/dL (ref 30.0–36.0)
MCV: 87.8 fL (ref 80.0–100.0)
Monocytes Absolute: 1.1 10*3/uL — ABNORMAL HIGH (ref 0.1–1.0)
Monocytes Relative: 12 %
Neutro Abs: 6 10*3/uL (ref 1.7–7.7)
Neutrophils Relative %: 68 %
Platelets: 345 10*3/uL (ref 150–400)
RBC: 4.75 MIL/uL (ref 3.87–5.11)
RDW: 12.6 % (ref 11.5–15.5)
WBC: 9 10*3/uL (ref 4.0–10.5)
nRBC: 0 % (ref 0.0–0.2)

## 2022-08-08 LAB — ETHANOL: Alcohol, Ethyl (B): <10 mg/dL (ref <10)

## 2022-08-08 LAB — LIPID PANEL
Cholesterol: 236 mg/dL — ABNORMAL HIGH (ref 0–200)
HDL: 89 mg/dL (ref >40)
LDL Cholesterol: 133 mg/dL — ABNORMAL HIGH (ref 0–99)
Total CHOL/HDL Ratio: 2.7 RATIO
Triglycerides: 69 mg/dL (ref <150)
VLDL: 14 mg/dL (ref 0–40)

## 2022-08-08 LAB — RESP PANEL BY RT-PCR (FLU A&B, COVID) ARPGX2
Influenza A by PCR: NEGATIVE
Influenza B by PCR: NEGATIVE
SARS Coronavirus 2 by RT PCR: NEGATIVE

## 2022-08-08 LAB — MAGNESIUM: Magnesium: 1.5 mg/dL — ABNORMAL LOW (ref 1.7–2.4)

## 2022-08-08 LAB — HEMOGLOBIN A1C
Hgb A1c MFr Bld: 5 % (ref 4.8–5.6)
Mean Plasma Glucose: 96.8 mg/dL

## 2022-08-08 LAB — PREGNANCY, URINE: Preg Test, Ur: NEGATIVE

## 2022-08-08 LAB — POC SARS CORONAVIRUS 2 AG -  ED

## 2022-08-08 LAB — TSH: TSH: 1.455 u[IU]/mL (ref 0.350–4.500)

## 2022-08-08 MED ORDER — MAGNESIUM HYDROXIDE 400 MG/5ML PO SUSP: 30.0000 mL | Freq: Every day | ORAL | Status: DC | PRN

## 2022-08-08 MED ORDER — ALUM & MAG HYDROXIDE-SIMETH 200-200-20 MG/5ML PO SUSP: 30.0000 mL | ORAL | Status: DC | PRN

## 2022-08-08 MED ORDER — TRAZODONE HCL 50 MG PO TABS: 50.0000 mg | ORAL_TABLET | Freq: Every evening | ORAL | Status: DC | PRN

## 2022-08-08 MED ORDER — LORAZEPAM 1 MG PO TABS: 1.0000 mg | ORAL_TABLET | ORAL | Status: DC | PRN

## 2022-08-08 MED ORDER — OLANZAPINE 5 MG PO TBDP
5.0000 mg | ORAL_TABLET | Freq: Three times a day (TID) | ORAL | Status: DC | PRN
  Administered 2022-08-08: 5 mg via ORAL
  Filled 2022-08-08: qty 1

## 2022-08-08 MED ORDER — GABAPENTIN 100 MG PO CAPS
200.0000 mg | ORAL_CAPSULE | Freq: Three times a day (TID) | ORAL | Status: DC
  Administered 2022-08-08 – 2022-08-09 (×3): 200 mg via ORAL
  Filled 2022-08-08 (×3): qty 2

## 2022-08-08 MED ORDER — CLONAZEPAM 0.5 MG PO TABS
0.5000 mg | ORAL_TABLET | Freq: Two times a day (BID) | ORAL | Status: DC
Start: 2022-08-08 — End: 2022-08-09
  Administered 2022-08-08 – 2022-08-09 (×2): 0.5 mg via ORAL
  Filled 2022-08-08 (×2): qty 1

## 2022-08-08 MED ORDER — ZIPRASIDONE MESYLATE 20 MG IM SOLR: 20.0000 mg | INTRAMUSCULAR | Status: DC | PRN

## 2022-08-08 MED ORDER — ALPRAZOLAM 0.5 MG PO TABS: 1.0000 mg | ORAL_TABLET | Freq: Two times a day (BID) | ORAL | Status: DC | PRN

## 2022-08-08 MED ORDER — OLANZAPINE 5 MG PO TBDP: 5.0000 mg | ORAL_TABLET | Freq: Once | ORAL | Status: DC

## 2022-08-08 MED ORDER — ACETAMINOPHEN 325 MG PO TABS: 650.0000 mg | ORAL_TABLET | Freq: Four times a day (QID) | ORAL | Status: DC | PRN

## 2022-08-08 MED ORDER — QUETIAPINE FUMARATE 50 MG PO TABS
50.0000 mg | ORAL_TABLET | Freq: Every day | ORAL | Status: DC
  Administered 2022-08-08: 50 mg via ORAL
  Filled 2022-08-08: qty 1

## 2022-08-08 MED ORDER — HYDROXYZINE HCL 25 MG PO TABS: 25.0000 mg | ORAL_TABLET | Freq: Three times a day (TID) | ORAL | Status: DC | PRN

## 2022-08-08 NOTE — BH Assessment (Addendum)
Comprehensive Clinical Assessment (CCA) Screening, Triage and Referral Note  08/08/2022 [name] [id number]  Triage/Screening completed. Patient is Routine. Patient is in Room 134. MSE completed and disposition is pending collateral information.      Chief Complaint: Brought in by [employer]. Patient states that her son called the police on her. She is a poor historian. [employer] report that patient's son called [employer] reporting that patient was not taking her medications and walking around outside naked. [employer] stated that when they arrived patient was outside shirtless.    Visit Diagnosis: Acute Psychosis  Patient Reported Information How did you hear about [place]? Legal System  What Is the Reason for Your Visit/Call Today?   [name] is a 51 year old female presenting voluntarily to [facility]. Dx's with Adjustment Disorder, GAD, Social Anxiety, and ADHD.  She was transported via [employer] (voluntarily). [employer] report  patient's son called [employer] reporting that patient was not taking her medications and walking around outside naked. [employer] stated that when they arrived patient was outside shirtless.  Upon chart review, patient presented last night 08/07/2022 to the [facility] via [employer] (see EPIC notes regarding visit) related to visit.  She had another visit 07/24/2022-07/25/2022 and was held overnight after presenting under IVC (see EPIC notes regarding visit). Today, she is guarded in all her responses and a poor historian. She answers some of her questions with unrelated responses. Mood is labile and thought processes disconnected. Her presentation is bizarre. Denies SI, HI, AVH's, alcohol/drug use.  Denies that she is prescribed any medications. Calm and cooperative.  How Long Has This Been Causing You Problems? <Week  What Do You Feel Would Help You the Most Today? Treatment for Depression or other mood problem; Social Support; Medication(s)   Have You Recently Had Any  Thoughts About Hurting Yourself? No  Are You Planning to Commit Suicide/Harm Yourself At This time? No   Have you Recently Had Thoughts About [place]? No  Are You Planning to Harm Someone at This Time? No  Explanation: No data recorded  Have You Used Any Alcohol or Drugs in the Past 24 Hours? No  How Long Ago Did You Use Drugs or Alcohol? No data recorded What Did You Use and How Much? No data recorded  Do You Currently Have a Therapist/Psychiatrist? Yes  Name of Therapist/Psychiatrist: [facility]   Have You Been Recently Discharged From Any Office Practice or Programs? No  Explanation of Discharge From Practice/Program: No data recorded   Do You Have any Outstanding Charges, Pending Court Dates, Parole/Probation? No data recorded Contacted To Inform of Risk of Harm To Self or Others: No data recorded  Does Patient Present under Involuntary Commitment? Yes  IVC Papers Initial File Date: 07/24/22   [place] of Residence: [place]   Patient Currently Receiving the Following Services: Not Receiving Services   Determination of Need: Urgent (48 hours)   Options For Referral: Medication Management; Outpatient Therapy; Other: Comment   Discharge Disposition: Thomes Lolling, NP, recommends inpatient psychiatric treatment.     Waldon Merl, Counselor

## 2022-08-08 NOTE — Progress Notes (Signed)
Pt was accepted to [facility]; [address] [place]  Pt meets inpatient criteria per   Attending Physician will be Dr.Rja Thotakura  Report can be called to: [phone]  Pt can arrive after: 8:00am  Care Team notified: Jamie Kato, RN   Benjaman Kindler, MSW, [facility] 08/09/2022 12:03 AM

## 2022-08-08 NOTE — Progress Notes (Signed)
Pt was accepted to [facility]; [address] [place]  Pt meets inpatient criteria per   Attending Physician will be Dr.Rja Thotakura  Report can be called to: [phone]  Pt can arrive after: 8:00am  (RN notified *name)   Nadara Mode, Pink 08/08/2022 @ 11:31 PM
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
## 2022-08-08 NOTE — ED Provider Notes (Signed)
[facility] Urgent Care Continuous Assessment Admission H&P  Date: 08/08/22 Patient Name: [name] MRN: [id number] Chief Complaint: No chief complaint on file.     Diagnoses:  Final diagnoses:  Bizarre behavior    HPI: [name] 51 y.o., female patient presented to [facility] as a walk in voluntarily via [employer] due to bizarre behavior.[employer] report  patient's son called [employer] reporting that patient was not taking her medications and walking around outside naked. [employer] stated that when they arrived patient was outside shirtless.   [name], 51 y.o., female patient seen face to face by this provider, consulted with Dr. Dwyane Dee; and chart reviewed on 08/08/22.  Chart review patient presented to [facility] on 08/07/2022 with similar presentation and was discharged.  She was also assessed 07/24/2022 at [facility] and was admitted to the continuous assessment unit and was discharged next day.  She has had services in the past at [facility] behavioral health on the second floor.  Her last visit was 06/25/2022. Per chart review patient has a history of GAD, social anxiety, ADHD, and MDD, and drug-induced delirium. She is prescribed Xanax 1 mg twice daily, Adderall 20 mg twice daily, gabapentin 800 mg 3 times daily  UDS on admission positive for THC and Benzodiazepines.   During evaluation [name] is observed sitting in the assessment room.  She is alert to self, city, but unaware of situation.  States she was brought here because it is her "birthday".  Her speech is clear and coherent but she is experiencing some thought blocking.  She is inattentive and answers questions inappropriately.  She is redirectable.  She has a flat affect.  She appears paranoid and states at one point during the evaluation "I am scared".  She denies SI/HI/AVH.  She does not appear to be responding to internal/external stimuli.  However when this writer walked out of the room patient took all of her clothes off and threw them in the trash can  and walked down the hall naked.  She became naked again when she was giving a urine sample.  When asked why she taken her close off she states "I do not know but I think I was going to take bath".  Patient is requesting to be discharged.  She cannot be safely discharged at this time.  She will be placed under involuntary commitment.  And recommended for inpatient psychiatric admission.  Collateral: Patient's daughter.  Reports her mother affect changed roughly 2 years ago.  She stopped laughing or having many emotions and became flat.  However over the past month she has decompensated and cannot maintain her own living/housing situation, pay her bills, or buy groceries.  She is not taking care of her ADLs.  She is not eating or sleeping.  Daughter states she notices that her mother talking to herself.  States she makes comments about the TV listening to her and peers paranoid.  Recently over the past couple days she is taking off all of her clothes and walking around the apartment naked.  She lives in an apartment with her son.  Daughter believes patient is a danger to herself because she is confused most of the time and is unaware of her surroundings or what she is doing.  Reports patient is currently receiving disability due to her back.  She is unemployed.   PHQ 2-9:  [place] ED from 11/13/2020 in [facility]  Thoughts that you would  be better off dead, or of hurting yourself in some way Not at all  PHQ-9 Total Score 0       Flowsheet Row ED from 07/24/2022 in [facility] Video Visit from 03/29/2022 in [facility] Video Visit from 12/28/2021 in [facility] No Risk No Risk No Risk        Total Time spent with patient: 30 minutes  Musculoskeletal  Strength & Muscle Tone: within normal limits Gait & Station: normal Patient leans: N/A  Psychiatric  Specialty Exam  Presentation General Appearance: Bizarre  Eye Contact:Fair  Speech:Clear and Coherent; Blocked  Speech Volume:Normal  Handedness:Right   Mood and Affect  Mood:Anxious; Dysphoric  Affect:Congruent; Flat   Thought Process  Thought Processes:-- (scattered at times)  Descriptions of Associations:Loose  Orientation:Full (Time, Place and Person)  Thought Content:Logical  Diagnosis of Schizophrenia or Schizoaffective disorder in past: No  Duration of Psychotic Symptoms: No data recorded Hallucinations:Hallucinations: None  Ideas of Reference:None  Suicidal Thoughts:Suicidal Thoughts: No  Homicidal Thoughts:Homicidal Thoughts: No   Sensorium  Memory:Immediate Poor; Remote Poor; Recent Poor  Judgment:Poor  Insight:Poor   Executive Functions  Concentration:Poor  Attention Span:Poor  Recall:Poor  Fund of Knowledge:Poor  Language:Good   Psychomotor Activity  Psychomotor Activity:Psychomotor Activity: Normal   Assets  Assets:Physical Health; Resilience   Sleep  Sleep:Sleep: Fair Number of Hours of Sleep: 6   Nutritional Assessment (For OBS and FBC admissions only) Has the patient had a weight loss or gain of 10 pounds or more in the last 3 months?: No Has the patient had a decrease in food intake/or appetite?: No Does the patient have dental problems?: No Does the patient have eating habits or behaviors that may be indicators of an eating disorder including binging or inducing vomiting?: No Has the patient recently lost weight without trying?: 2.0 Has the patient been eating poorly because of a decreased appetite?: 0 Malnutrition Screening Tool Score: 2    Physical Exam Vitals and nursing note reviewed.  Constitutional:      General: She is not in acute distress.    Appearance: Normal appearance. She is not ill-appearing.  HENT:     Head: Normocephalic.  Eyes:     General:        Right eye: No discharge.        Left eye:  No discharge.     Conjunctiva/sclera: Conjunctivae normal.  Cardiovascular:     Rate and Rhythm: Normal rate.  Pulmonary:     Effort: Pulmonary effort is normal.  Musculoskeletal:        General: Normal range of motion.     Cervical back: Normal range of motion.  Skin:    Coloration: Skin is not jaundiced or pale.  Neurological:     Mental Status: She is alert.  Psychiatric:        Attention and Perception: She is inattentive.        Mood and Affect: Affect normal. Mood is anxious.        Behavior: Behavior is cooperative.        Thought Content: Thought content is paranoid.        Cognition and Memory: Cognition normal.        Judgment: Judgment is impulsive.    Review of Systems  Constitutional: Negative.   HENT: Negative.    Eyes: Negative.   Respiratory: Negative.    Cardiovascular: Negative.   Musculoskeletal: Negative.  Skin: Negative.   Neurological: Negative.   Psychiatric/Behavioral:  The patient is nervous/anxious.     Blood pressure 133/89, pulse 76, temperature 98.6 F (37 C), temperature source Oral, resp. rate 18, SpO2 100 %. There is no height or weight on file to calculate BMI.  Past Psychiatric History:     GAD, social anxiety, ADHD, and MDD, and drug-induced delirium.   Is the patient at risk to self? Yes  Has the patient been a risk to self in the past 6 months? Yes .    Has the patient been a risk to self within the distant past? Yes   Is the patient a risk to others? No   Has the patient been a risk to others in the past 6 months? No   Has the patient been a risk to others within the distant past? No   Past Medical History:  Past Medical History:  Diagnosis Date   ADHD (attention deficit hyperactivity disorder)    Anxiety    Depression    Hx of staphylococcal septicemia     Past Surgical History:  Procedure Laterality Date   ABDOMINAL HYSTERECTOMY     BACK SURGERY     left elbow surgery     VIDEO BRONCHOSCOPY N/A 01/27/2013    Procedure: VIDEO BRONCHOSCOPY WITH FLUORO;  Surgeon: Elsie Stain, MD;  Location: [facility];  Service: Cardiopulmonary;  Laterality: N/A;    Family History: No family history on file.  Social History:  Social History   Socioeconomic History   Marital status: Widowed    Spouse name: Not on file   Number of children: Not on file   Years of education: Not on file   Highest education level: Not on file  Occupational History   Not on file  Tobacco Use   Smoking status: Some Days    Packs/day: 1.00    Years: 20.00    Total pack years: 20.00    Types: Cigarettes   Smokeless tobacco: Never  Vaping Use   Vaping Use: Never used  Substance and Sexual Activity   Alcohol use: No   Drug use: No   Sexual activity: Not on file  Other Topics Concern   Not on file  Social History Narrative   Not on file   Social Determinants of Health   Financial Resource Strain: Not on file  Food Insecurity: Not on file  Transportation Needs: Not on file  Physical Activity: Not on file  Stress: Not on file  Social Connections: Not on file  Intimate Partner Violence: Not on file    SDOH:  SDOH Screenings   Alcohol Screen: Low Risk  (11/14/2020)   Alcohol Screen    Last Alcohol Screening Score (AUDIT): 0  Depression (PHQ2-9): Low Risk  (06/25/2022)   Depression (PHQ2-9)    PHQ-2 Score: 4  Financial Resource Strain: Not on file  Food Insecurity: Not on file  Housing: Not on file  Physical Activity: Not on file  Social Connections: Not on file  Stress: Not on file  Tobacco Use: High Risk (06/26/2022)   Patient History    Smoking Tobacco Use: Some Days    Smokeless Tobacco Use: Never    Passive Exposure: Not on file  Transportation Needs: Not on file    Last Labs:  Admission on 08/08/2022  Component Date Value Ref Range Status   POC Amphetamine UR 08/08/2022 None Detected  NONE DETECTED (Cut Off Level 1000 ng/mL) Final   POC Secobarbital (BAR) 08/08/2022  None Detected  NONE  DETECTED (Cut Off Level 300 ng/mL) Final   POC Buprenorphine (BUP) 08/08/2022 None Detected  NONE DETECTED (Cut Off Level 10 ng/mL) Final   POC Oxazepam (BZO) 08/08/2022 Positive (A)  NONE DETECTED (Cut Off Level 300 ng/mL) Final   POC Cocaine UR 08/08/2022 None Detected  NONE DETECTED (Cut Off Level 300 ng/mL) Final   POC Methamphetamine UR 08/08/2022 None Detected  NONE DETECTED (Cut Off Level 1000 ng/mL) Final   POC Morphine 08/08/2022 None Detected  NONE DETECTED (Cut Off Level 300 ng/mL) Final   POC Methadone UR 08/08/2022 None Detected  NONE DETECTED (Cut Off Level 300 ng/mL) Final   POC Oxycodone UR 08/08/2022 None Detected  NONE DETECTED (Cut Off Level 100 ng/mL) Final   POC Marijuana UR 08/08/2022 Positive (A)  NONE DETECTED (Cut Off Level 50 ng/mL) Final  Admission on 07/24/2022, Discharged on 07/25/2022  Component Date Value Ref Range Status   SARS Coronavirus 2 by RT PCR 07/24/2022 NEGATIVE  NEGATIVE Final   Comment: (NOTE) SARS-CoV-2 target nucleic acids are NOT DETECTED.  The SARS-CoV-2 RNA is generally detectable in upper respiratory specimens during the acute phase of infection. The lowest concentration of SARS-CoV-2 viral copies this assay can detect is 138 copies/mL. A negative result does not preclude SARS-Cov-2 infection and should not be used as the sole basis for treatment or other patient management decisions. A negative result may occur with  improper specimen collection/handling, submission of specimen other than nasopharyngeal swab, presence of viral mutation(s) within the areas targeted by this assay, and inadequate number of viral copies(<138 copies/mL). A negative result must be combined with clinical observations, patient history, and epidemiological information. The expected result is Negative.  Fact Sheet for Patients:  EntrepreneurPulse.com.au  Fact Sheet for Healthcare Providers:  IncredibleEmployment.be  This test  is no                          t yet approved or cleared by the [place] FDA and  has been authorized for detection and/or diagnosis of SARS-CoV-2 by FDA under an Emergency Use Authorization (EUA). This EUA will remain  in effect (meaning this test can be used) for the duration of the COVID-19 declaration under Section 564(b)(1) of the Act, 21 U.S.C.section 360bbb-3(b)(1), unless the authorization is terminated  or revoked sooner.       Influenza A by PCR 07/24/2022 NEGATIVE  NEGATIVE Final   Influenza B by PCR 07/24/2022 NEGATIVE  NEGATIVE Final   Comment: (NOTE) The Xpert Xpress SARS-CoV-2/FLU/RSV plus assay is intended as an aid in the diagnosis of influenza from Nasopharyngeal swab specimens and should not be used as a sole basis for treatment. Nasal washings and aspirates are unacceptable for Xpert Xpress SARS-CoV-2/FLU/RSV testing.  Fact Sheet for Patients: EntrepreneurPulse.com.au  Fact Sheet for Healthcare Providers: IncredibleEmployment.be  This test is not yet approved or cleared by the [place] FDA and has been authorized for detection and/or diagnosis of SARS-CoV-2 by FDA under an Emergency Use Authorization (EUA). This EUA will remain in effect (meaning this test can be used) for the duration of the COVID-19 declaration under Section 564(b)(1) of the Act, 21 U.S.C. section 360bbb-3(b)(1), unless the authorization is terminated or revoked.  Performed at [facility], [place] [address], [place], [place] [address]    WBC 07/24/2022 7.9  4.0 - 10.5 K/uL Final   RBC 07/24/2022 4.35  3.87 - 5.11 MIL/uL Final   Hemoglobin  07/24/2022 13.7  12.0 - 15.0 g/dL Final   HCT 07/24/2022 38.5  36.0 - 46.0 % Final   MCV 07/24/2022 88.5  80.0 - 100.0 fL Final   MCH 07/24/2022 31.5  26.0 - 34.0 pg Final   MCHC 07/24/2022 35.6  30.0 - 36.0 g/dL Final   RDW 07/24/2022 12.7  11.5 - 15.5 % Final   Platelets 07/24/2022 295  150 - 400  K/uL Final   nRBC 07/24/2022 0.0  0.0 - 0.2 % Final   Neutrophils Relative % 07/24/2022 61  % Final   Neutro Abs 07/24/2022 4.8  1.7 - 7.7 K/uL Final   Lymphocytes Relative 07/24/2022 28  % Final   Lymphs Abs 07/24/2022 2.2  0.7 - 4.0 K/uL Final   Monocytes Relative 07/24/2022 10  % Final   Monocytes Absolute 07/24/2022 0.8  0.1 - 1.0 K/uL Final   Eosinophils Relative 07/24/2022 0  % Final   Eosinophils Absolute 07/24/2022 0.0  0.0 - 0.5 K/uL Final   Basophils Relative 07/24/2022 1  % Final   Basophils Absolute 07/24/2022 0.0  0.0 - 0.1 K/uL Final   Immature Granulocytes 07/24/2022 0  % Final   Abs Immature Granulocytes 07/24/2022 0.02  0.00 - 0.07 K/uL Final   Performed at [facility], [place] [address], [place], [place] [address]   Sodium 07/24/2022 138  135 - 145 mmol/L Final   Potassium 07/24/2022 3.6  3.5 - 5.1 mmol/L Final   Chloride 07/24/2022 103  98 - 111 mmol/L Final   CO2 07/24/2022 27  22 - 32 mmol/L Final   Glucose, Bld 07/24/2022 91  70 - 99 mg/dL Final   Glucose reference range applies only to samples taken after fasting for at least 8 hours.   BUN 07/24/2022 9  6 - 20 mg/dL Final   Creatinine, Ser 07/24/2022 0.66  0.44 - 1.00 mg/dL Final   Calcium 07/24/2022 9.9  8.9 - 10.3 mg/dL Final   Total Protein 07/24/2022 7.2  6.5 - 8.1 g/dL Final   Albumin 07/24/2022 4.4  3.5 - 5.0 g/dL Final   AST 07/24/2022 18  15 - 41 U/L Final   ALT 07/24/2022 20  0 - 44 U/L Final   Alkaline Phosphatase 07/24/2022 83  38 - 126 U/L Final   Total Bilirubin 07/24/2022 0.9  0.3 - 1.2 mg/dL Final   GFR, Estimated 07/24/2022 >60  >60 mL/min Final   Comment: (NOTE) Calculated using the CKD-EPI Creatinine Equation (2021)    Anion gap 07/24/2022 8  5 - 15 Final   Performed at [facility], [place] [address], [place], [place] [address]   Hgb A1c MFr Bld 07/24/2022 5.3  4.8 - 5.6 % Final   Comment: (NOTE) Pre diabetes:          5.7%-6.4%  Diabetes:              >6.4%  Glycemic control  for   <7.0% adults with diabetes    Mean Plasma Glucose 07/24/2022 105.41  mg/dL Final   Performed at [facility], [place] [address] [place], [place] [address]   Cholesterol 07/24/2022 220 (H)  0 - 200 mg/dL Final   Triglycerides 07/24/2022 57  <150 mg/dL Final   HDL 07/24/2022 76  >40 mg/dL Final   Total CHOL/HDL Ratio 07/24/2022 2.9  RATIO Final   VLDL 07/24/2022 11  0 - 40 mg/dL Final   LDL Cholesterol 07/24/2022 133 (H)  0 - 99 mg/dL Final  Comment:        Total Cholesterol/HDL:CHD Risk Coronary Heart Disease Risk Table                     Men   Women  1/2 Average Risk   3.4   3.3  Average Risk       5.0   4.4  2 X Average Risk   9.6   7.1  3 X Average Risk  23.4   11.0        Use the calculated Patient Ratio above and the CHD Risk Table to determine the patient's CHD Risk.        ATP III CLASSIFICATION (LDL):  <100     mg/dL   Optimal  100-129  mg/dL   Near or Above                    Optimal  130-159  mg/dL   Borderline  160-189  mg/dL   High  >190     mg/dL   Very High Performed at [place] [address] [place], [place] [address]    TSH 07/24/2022 1.469  0.350 - 4.500 uIU/mL Final   Comment: Performed by a 3rd Generation assay with a functional sensitivity of <=0.01 uIU/mL. Performed at [facility], [place] [address] [place], [place] [address]    POC Amphetamine UR 07/24/2022 None Detected  NONE DETECTED (Cut Off Level 1000 ng/mL) Final   POC Secobarbital (BAR) 07/24/2022 None Detected  NONE DETECTED (Cut Off Level 300 ng/mL) Final   POC Buprenorphine (BUP) 07/24/2022 None Detected  NONE DETECTED (Cut Off Level 10 ng/mL) Final   POC Oxazepam (BZO) 07/24/2022 Positive (A)  NONE DETECTED (Cut Off Level 300 ng/mL) Final   POC Cocaine UR 07/24/2022 None Detected  NONE DETECTED (Cut Off Level 300 ng/mL) Final   POC Methamphetamine UR 07/24/2022 None Detected  NONE DETECTED (Cut Off Level 1000 ng/mL) Final   POC Morphine 07/24/2022 None Detected   NONE DETECTED (Cut Off Level 300 ng/mL) Final   POC Methadone UR 07/24/2022 None Detected  NONE DETECTED (Cut Off Level 300 ng/mL) Final   POC Oxycodone UR 07/24/2022 None Detected  NONE DETECTED (Cut Off Level 100 ng/mL) Final   POC Marijuana UR 07/24/2022 Positive (A)  NONE DETECTED (Cut Off Level 50 ng/mL) Final   SARSCOV2ONAVIRUS 2 AG 07/24/2022 NEGATIVE  NEGATIVE Final   Comment: (NOTE) SARS-CoV-2 antigen NOT DETECTED.   Negative results are presumptive.  Negative results do not preclude SARS-CoV-2 infection and should not be used as the sole basis for treatment or other patient management decisions, including infection  control decisions, particularly in the presence of clinical signs and  symptoms consistent with COVID-19, or in those who have been in contact with the virus.  Negative results must be combined with clinical observations, patient history, and epidemiological information. The expected result is Negative.  Fact Sheet for Patients: HandmadeRecipes.com.cy  Fact Sheet for Healthcare Providers: FuneralLife.at  This test is not yet approved or cleared by the [place] FDA and  has been authorized for detection and/or diagnosis of SARS-CoV-2 by FDA under an Emergency Use Authorization (EUA).  This EUA will remain in effect (meaning this test can be used) for the duration of  the COV                          [place]-19 declaration under Section 564(b)(1)  of the Act, 21 U.S.C. section 360bbb-3(b)(1), unless the authorization is terminated or revoked sooner.     SARSCOV2ONAVIRUS 2 AG 07/24/2022 NEGATIVE  NEGATIVE Final   Comment: (NOTE) SARS-CoV-2 antigen NOT DETECTED.   Negative results are presumptive.  Negative results do not preclude SARS-CoV-2 infection and should not be used as the sole basis for treatment or other patient management decisions, including infection  control decisions, particularly in the presence of  clinical signs and  symptoms consistent with COVID-19, or in those who have been in contact with the virus.  Negative results must be combined with clinical observations, patient history, and epidemiological information. The expected result is Negative.  Fact Sheet for Patients: HandmadeRecipes.com.cy  Fact Sheet for Healthcare Providers: FuneralLife.at  This test is not yet approved or cleared by the [place] FDA and  has been authorized for detection and/or diagnosis of SARS-CoV-2 by FDA under an Emergency Use Authorization (EUA).  This EUA will remain in effect (meaning this test can be used) for the duration of  the COV                          ID-19 declaration under Section 564(b)(1) of the Act, 21 U.S.C. section 360bbb-3(b)(1), unless the authorization is terminated or revoked sooner.     Preg Test, Ur 07/24/2022 NEGATIVE  NEGATIVE Final   Comment:        THE SENSITIVITY OF THIS METHODOLOGY IS >24 mIU/mL     Allergies: Penicillins  PTA Medications: (Not in a hospital admission)   Medical Decision Making  Patient presents the [facility] with bizarre behavior. She is thought blocking, answering questions inappropriately. She is paranoid. She can not be safely discharged at this time. She will be placed under IVC. She will be recommended for IP admission.     Recommendations  Based on my evaluation the patient does not appear to have an emergency medical condition.  Patient is recommended for IP admission. She will be placed under IVC.  [facility] notified and there is no bed availability.  Patient will be faxed out  Seroquel 50 mg QHS Alprazolam 1 mg BID discontinued Klonopin 0.5 mg BID ordered  Gabapentin 200 mg 3 times daily oral-a decrease from her 800 mg 3 times daily.  Patient states she doesn't think she has been taking her medications.   Lab Orders         Resp Panel by RT-PCR (Flu A&B, Covid) Anterior Nasal  Swab         CBC with Differential/Platelet         Comprehensive metabolic panel         Hemoglobin A1c         Magnesium         Ethanol         Lipid panel         TSH         RPR         Urinalysis, Routine w reflex microscopic Urine, Clean Catch         Pregnancy, urine         POCT Urine Drug Screen - (I-Screen)         POC SARS Coronavirus 2 Ag-ED - Nasal Swab      EKG.     Revonda Humphrey, NP 08/08/22  5:05 PM

## 2022-08-08 NOTE — Progress Notes (Signed)
Pt admitted to Continuous observation due to bizarre behavior. Pt took her clothes off  and threw them in the trash in the assessment room prior to admission. Pt appears to be thought blocking, confused and guarded. Pt appears to be paranoid and restless. Pt reported hx of falls with recent one on 08/07/22 due to feeling dizzy.  Pt is ambulatory and is oriented to staff and unit. Pt was cooperative with labs and skin assessment. Pt denies pain and current SI/HI/AVH. Staff will monitor for pt's safety.

## 2022-08-08 NOTE — ED Notes (Signed)
Pt just arrived to the unit  

## 2022-08-08 NOTE — Discharge Instructions (Addendum)
For additional resources, call 211 or go to [email or web address].    [facility] [place], [place], [address] [phone] main phone  This is a 24/7 facility offering mental health and substance use disorder services along with crisis assessment, an inpatient program, detox, etc. Accepts Medicaid, Medicare, and most private insurance.   [facility] [place], [place], [address] [phone] main phone  Inpatient is 24/7.  Offers several inpatient mental health programs.    [facility] [address] [place], [place], [address] [phone] phone [email or web address]   [facility] [address] [place], [place], [address] [phone] main phone  24/7 facility.  Offers inpatient mental health and substance use disorder services for adults and youth. Accepts Medicaid, Medicare, and most private insurances.   [facility] [address] [place], [place], [address] [phone] main phone  This is a 24/7 facility offering inpatient and outpatient mental health and substance use disorder services. Offers counseling, case management, and more.   Accepts Medicaid, Medicare, and most private insurance.   [place], [place], [address] [phone] phone [phone] fax   [facility] [address] [place], [place], [address] [phone] phone [phone] fax    Base on the information you have provided and the presenting issue, outpatient services with therapy and psychiatry have been recommended.  It is imperative that you follow through with treatment recommendations within 5-7 days from the of discharge to mitigate further risk to your safety and mental well-being. A list of referrals has been provided below to get you started.  You are not limited to the list provided.  In case of an urgent crisis, you may contact the Mobile Crisis  Unit with [facility] at [phone].  Outpatient Therapy and Psychiatry for Medicare Recipients  [place] [address] [place], [place], [address] [phone] phone  [facility] [address] [place], [place], [address] [phone] phone  [facility] [address], [place], [place], [address] [phone] phone  [address] [place], [place], [address] [phone] phone [phone] fax  [facility], [place] [address], [place], [address] [phone] phone  [name] [place], [place], [address] [phone] phone [phone] fax  [facility] [address] [place], [place], [address] [phone] phone  [name] [address] [place], [place], [address] [phone] phone  [facility]. [place], [place], [address] [phone] phone [phone] fax   For your behavioral health needs, you are advised to follow up with an outpatient provider.  You may be eligible for ACT Team services, which would include more frequent visits with your provider, as well as in-home services.  The following providers offer ACT Team services.  Contact them at your earliest opportunity to ask about enrolling in their program:       [facility]      [address] [place], [place] [address]      [phone] [address]      [place], [place] [address]      [phone]       [facility] [place]  [place], [place] [address]      [phone]       [facility]      [address] [place]      [address]      [place], [place]  [address]      [phone]       [facility]      [address]      [place],  [address]      [phone]

## 2022-08-08 NOTE — BH Assessment (Deleted)
LCSW Progress Note   Per Thomes Lolling, NP, this pt does not require psychiatric hospitalization at this time.  Pt is psychiatrically cleared.  Discharge instructions include several resources for inpatient/residential facilities, outpatient therapy and medication management, and providers offering ACTT service.  EDP Thomes Lolling, NP, has been notified.  Omelia Blackwater, MSW, [place] [phone] or [phone]

## 2022-08-08 NOTE — ED Notes (Addendum)
Pt resting in bed. Alert, denies SI/HI/AVH. Respirations even and unlabored. Monitoring for safety.

## 2022-08-09 DIAGNOSIS — R410 Disorientation, unspecified: Secondary | ICD-10-CM | POA: Diagnosis not present

## 2022-08-09 DIAGNOSIS — R462 Strange and inexplicable behavior: Secondary | ICD-10-CM | POA: Diagnosis not present

## 2022-08-09 LAB — RPR: RPR Ser Ql: NONREACTIVE

## 2022-08-09 NOTE — ED Notes (Signed)
Pt was given a salad and roast beef for lunch.

## 2022-08-09 NOTE — ED Notes (Signed)
Pt asleep in bed. Respirations even and unlabored. Monitoring for safety. 

## 2022-08-09 NOTE — ED Provider Notes (Signed)
FBC/OBS ASAP Discharge Summary  Date and Time: 08/09/2022 11:23 AM  Name: [name]  MRN:  [id number]   Discharge Diagnoses:  Final diagnoses:  Bizarre behavior   Subjective:  Pt states "I don't know where anyone is." When asked what she means, she states she does not know where her daughter or son are currently. She asks me why she is being asked the same questions again today. Discussed w/ pt that an assessment is completed every day she is at this facility. She becomes frustrated and states "you already have all the information". She states she is not sure why she was brought to this facility and believes her family continues to bring her to the hospital. She states that she lives with her son. She reports feeling anxious and irritable about being at this facility. She denies she has been using any substances. Utox from 08/08/22 is +oxazepam (BZO), +marijuana. PDMP reviewed and rx for alprazolam last written on 06/14/22 and filled on 07/23/22. She denies SI/VI/HI, AVH, paranoia. Discussed w/ pt that she has been placed under IVC during admission and that [facility]. Pt verbalizes understanding. Pt gave verbal consent to call her daughter [name] and her son [name]. Call made to her son [name] at [phone] and reviewed pt has been accepted to [facility] and will transferred there today. [name] states he has been living with pt. He states that he will be moving to [place] in the next week or two. He states [name] will be blocking their mother soon. He states that on Saturday pt texted him while he was at home and asked "Are you here? I killed my parents." [name] states pt's mother passed away from an aneurysm in her sleep and pt's father passed away from a heart attack. Call made to pt's daughter [name] at [phone] and reviewed pt has been accepted to [facility] and will be transferred there today.   Stay Summary:  Pt is a 51 y/o female presenting  to [facility] on 08/08/22 due to bizarre behavior. She was placed under IVC during admission. Pt [facility] been [facility].  Total Time spent with patient: 20 minutes  Past Psychiatric History:  Past Medical History:  Past Medical History:  Diagnosis Date   ADHD (attention deficit hyperactivity disorder)    Anxiety    Depression    Hx of staphylococcal septicemia     Past Surgical History:  Procedure Laterality Date   ABDOMINAL HYSTERECTOMY     BACK SURGERY     left elbow surgery     VIDEO BRONCHOSCOPY N/A 01/27/2013   Procedure: VIDEO BRONCHOSCOPY WITH FLUORO;  Surgeon: Elsie Stain, MD;  Location: [facility];  Service: Cardiopulmonary;  Laterality: N/A;   Family History: No family history on file. Family Psychiatric History: Pt reports possible family psychiatric hx although does not provide further information. Social History:  Social History   Substance and Sexual Activity  Alcohol Use No     Social History   Substance and Sexual Activity  Drug Use No    Social History   Socioeconomic History   Marital status: Widowed    Spouse name: Not on file   Number of children: Not on file   Years of education: Not on file   Highest education level: Not on file  Occupational History   Not on file  Tobacco Use   Smoking status: Some Days    Packs/day:  1.00    Years: 20.00    Total pack years: 20.00    Types: Cigarettes   Smokeless tobacco: Never  Vaping Use   Vaping Use: Never used  Substance and Sexual Activity   Alcohol use: No   Drug use: No   Sexual activity: Not on file  Other Topics Concern   Not on file  Social History Narrative   Not on file   Social Determinants of Health   Financial Resource Strain: Not on file  Food Insecurity: Not on file  Transportation Needs: Not on file  Physical Activity: Not on file  Stress: Not on file  Social Connections: Not on file   SDOH:  SDOH Screenings   Alcohol Screen: Low Risk   (11/14/2020)   Alcohol Screen    Last Alcohol Screening Score (AUDIT): 0  Depression (PHQ2-9): Low Risk  (06/25/2022)   Depression (PHQ2-9)    PHQ-2 Score: 4  Financial Resource Strain: Not on file  Food Insecurity: Not on file  Housing: Not on file  Physical Activity: Not on file  Social Connections: Not on file  Stress: Not on file  Tobacco Use: High Risk (06/26/2022)   Patient History    Smoking Tobacco Use: Some Days    Smokeless Tobacco Use: Never    Passive Exposure: Not on file  Transportation Needs: Not on file    Tobacco Cessation:  N/A, patient does not currently use tobacco products  Current Medications:  Current Facility-Administered Medications  Medication Dose Route Frequency Provider Last Rate Last Admin   acetaminophen (TYLENOL) tablet 650 mg  650 mg Oral Q6H PRN Revonda Humphrey, NP       alum & mag hydroxide-simeth (MAALOX/MYLANTA) 200-200-20 MG/5ML suspension 30 mL  30 mL Oral Q4H PRN Revonda Humphrey, NP       clonazePAM Bobbye [place]) tablet 0.5 mg  0.5 mg Oral BID Revonda Humphrey, NP   0.5 mg at 08/09/22 2119   gabapentin (NEURONTIN) capsule 200 mg  200 mg Oral TID Revonda Humphrey, NP   200 mg at 08/09/22 4174   hydrOXYzine (ATARAX) tablet 25 mg  25 mg Oral TID PRN Revonda Humphrey, NP       OLANZapine zydis (ZYPREXA) disintegrating tablet 5 mg  5 mg Oral Q8H PRN Revonda Humphrey, NP   5 mg at 08/08/22 1727   And   LORazepam (ATIVAN) tablet 1 mg  1 mg Oral PRN Revonda Humphrey, NP       And   ziprasidone (GEODON) injection 20 mg  20 mg Intramuscular PRN Revonda Humphrey, NP       magnesium hydroxide (MILK OF MAGNESIA) suspension 30 mL  30 mL Oral Daily PRN Revonda Humphrey, NP       QUEtiapine (SEROQUEL) tablet 50 mg  50 mg Oral QHS Revonda Humphrey, NP   50 mg at 08/08/22 2134   traZODone (DESYREL) tablet 50 mg  50 mg Oral QHS PRN Revonda Humphrey, NP       Current Outpatient Medications  Medication Sig Dispense Refill   ALPRAZolam  (XANAX) 1 MG tablet Take 1 tablet (1 mg total) by mouth 2 (two) times daily as needed. for anxiety 60 tablet 1    PTA Medications: (Not in a hospital admission)      06/25/2022    2:46 PM 03/29/2022    2:48 PM 12/28/2021    2:51 PM  Depression screen PHQ 2/9  Decreased Interest 2 0 0  Down, Depressed, Hopeless 2 0 0  PHQ - 2 Score 4 0 0    Flowsheet Row ED from 07/24/2022 in [facility] Video Visit from 03/29/2022 in [facility] Video Visit from 12/28/2021 in [place] No Risk No Risk No Risk       Musculoskeletal  Strength & Muscle Tone: within normal limits Gait & Station: normal Patient leans: N/A  Psychiatric Specialty Exam  Presentation  General Appearance: Appropriate for Environment  Eye Contact:Fair  Speech:Clear and Coherent; Normal Rate  Speech Volume:Normal  Handedness:Right   Mood and Affect  Mood:Anxious; Irritable  Affect:Congruent; Flat   Thought Process  Thought Processes:Coherent  Descriptions of Associations:Intact  Orientation:Full (Time, Place and Person)  Thought Content:Logical  Diagnosis of Schizophrenia or Schizoaffective disorder in past: No  Duration of Psychotic Symptoms: No data recorded  Hallucinations:Hallucinations: None  Ideas of Reference:None  Suicidal Thoughts:Suicidal Thoughts: No  Homicidal Thoughts:Homicidal Thoughts: No   Sensorium  Memory:Immediate Fair  Judgment:Poor  Insight:Poor   Executive Functions  Concentration:Fair  Attention Span:Fair  [place]  Language:Good   Psychomotor Activity  Psychomotor Activity:Psychomotor Activity: Normal   Assets  Assets:Communication Skills; Financial Resources/Insurance; Housing; Social Support   Sleep  Sleep:Sleep: Fair Number of Hours of Sleep: 6   Nutritional Assessment (For OBS and FBC admissions only) Has  the patient had a weight loss or gain of 10 pounds or more in the last 3 months?: No Has the patient had a decrease in food intake/or appetite?: No Does the patient have dental problems?: No Does the patient have eating habits or behaviors that may be indicators of an eating disorder including binging or inducing vomiting?: No Has the patient recently lost weight without trying?: 2.0 Has the patient been eating poorly because of a decreased appetite?: 0 Malnutrition Screening Tool Score: 2    Physical Exam  Physical Exam Cardiovascular:     Rate and Rhythm: Normal rate.  Pulmonary:     Effort: Pulmonary effort is normal.  Neurological:     Mental Status: She is alert and oriented to person, place, and time.  Psychiatric:        Attention and Perception: Attention and perception normal.        Mood and Affect: Mood is anxious. Affect is flat.        Speech: Speech normal.        Behavior: Behavior is combative. Behavior is cooperative.    Review of Systems  Constitutional:  Negative for chills and fever.  Respiratory:  Negative for shortness of breath.   Cardiovascular:  Negative for chest pain and palpitations.  Gastrointestinal:  Negative for abdominal pain.  Neurological:  Negative for dizziness and headaches.  Psychiatric/Behavioral: Negative.     Blood pressure 109/88, pulse 77, temperature 98.2 F (36.8 C), resp. rate 18, SpO2 100 %. There is no height or weight on file to calculate BMI.  Demographic Factors:  Caucasian and Unemployed  Loss Factors: NA  Historical Factors: Prior suicide attempts  Risk Reduction Factors:   Living with another person, especially a relative and Positive social support  Continued Clinical Symptoms:  Previous Psychiatric Diagnoses and Treatments  Cognitive Features That Contribute To Risk:  None    Suicide Risk:  Minimal: No identifiable suicidal ideation.  Patients presenting with no risk factors but with morbid ruminations;  may be classified as minimal risk based on the severity of the  depressive symptoms  Plan Of Care/Follow-up recommendations:  [facility]  Disposition:  Pt has been accepted to [facility] for inpatient admission  Tharon Aquas, NP 08/09/2022, 11:23 AM

## 2022-08-09 NOTE — ED Notes (Signed)
[employer]iff [employer] here at this time to transport pt to OV.  All belongings returned to pt .  Pt left without incident.

## 2022-08-09 NOTE — ED Notes (Signed)
Pt is awake and alert.  She is asking " where am I... I need my phone".  Pt  was instructed by provider that she will be going to [facility] and that her family will be notified.  Pt  settled down and ate a muffin.  No current distress noted.

## 2022-08-09 NOTE — ED Notes (Signed)
Pt given towels and hygiene supplies including denture care items.  Has asked to take a shower.

## 2022-08-09 NOTE — ED Notes (Signed)
Voicemail left for sheriff transport to take IVC'd pt to [facility] this morning after 8am.

## 2022-08-09 NOTE — ED Notes (Signed)
Report called to [place] at [facility].  [place] 3W.  Verbalized understanding.

## 2022-08-09 NOTE — ED Notes (Signed)
Pt was given a muffin and juice for breakfast.  

## 2022-08-09 NOTE — ED Notes (Signed)
Patient is resting comfortably. 

## 2022-08-24 ENCOUNTER — Telehealth (HOSPITAL_COMMUNITY): Payer: Medicare HMO | Admitting: Physician Assistant

## 2022-08-26 ENCOUNTER — Emergency Department (HOSPITAL_COMMUNITY)
Admission: EM | Admit: 2022-08-26 | Discharge: 2022-08-26 | Discharge status: Against Medical Advice | Payer: Medicare HMO | Attending: Emergency Medicine | Admitting: Emergency Medicine

## 2022-08-26 ENCOUNTER — Encounter (HOSPITAL_COMMUNITY): Payer: Self-pay | Admitting: Emergency Medicine

## 2022-08-26 ENCOUNTER — Other Ambulatory Visit: Payer: Self-pay

## 2022-08-26 DIAGNOSIS — F22 Delusional disorders: Secondary | ICD-10-CM | POA: Diagnosis not present

## 2022-08-26 DIAGNOSIS — M545 Low back pain, unspecified: Secondary | ICD-10-CM | POA: Diagnosis present

## 2022-08-26 DIAGNOSIS — Z5321 Procedure and treatment not carried out due to patient leaving prior to being seen by health care provider: Secondary | ICD-10-CM | POA: Diagnosis not present

## 2022-08-26 DIAGNOSIS — F419 Anxiety disorder, unspecified: Secondary | ICD-10-CM | POA: Diagnosis not present

## 2022-08-26 LAB — SALICYLATE LEVEL: Salicylate Lvl: <7 mg/dL — ABNORMAL LOW (ref 7.0–30.0)

## 2022-08-26 LAB — URINALYSIS, ROUTINE W REFLEX MICROSCOPIC
Bilirubin Urine: NEGATIVE
Glucose, UA: NEGATIVE mg/dL
Hgb urine dipstick: NEGATIVE
Ketones, ur: NEGATIVE mg/dL
Nitrite: NEGATIVE
Protein, ur: NEGATIVE mg/dL
Specific Gravity, Urine: 1.003 — ABNORMAL LOW (ref 1.005–1.030)
pH: 6 (ref 5.0–8.0)

## 2022-08-26 LAB — CBC
HCT: 43.1 % (ref 36.0–46.0)
Hemoglobin: 15.1 g/dL — ABNORMAL HIGH (ref 12.0–15.0)
MCH: 32.1 pg (ref 26.0–34.0)
MCHC: 35 g/dL (ref 30.0–36.0)
MCV: 91.7 fL (ref 80.0–100.0)
Platelets: 398 10*3/uL (ref 150–400)
RBC: 4.7 MIL/uL (ref 3.87–5.11)
RDW: 13.1 % (ref 11.5–15.5)
WBC: 11.6 10*3/uL — ABNORMAL HIGH (ref 4.0–10.5)
nRBC: 0 % (ref 0.0–0.2)

## 2022-08-26 LAB — COMPREHENSIVE METABOLIC PANEL
ALT: 16 U/L (ref 0–44)
AST: 17 U/L (ref 15–41)
Albumin: 4.5 g/dL (ref 3.5–5.0)
Alkaline Phosphatase: 82 U/L (ref 38–126)
Anion gap: 10 (ref 5–15)
BUN: 5 mg/dL — ABNORMAL LOW (ref 6–20)
CO2: 26 mmol/L (ref 22–32)
Calcium: 10.2 mg/dL (ref 8.9–10.3)
Chloride: 101 mmol/L (ref 98–111)
Creatinine, Ser: 0.72 mg/dL (ref 0.44–1.00)
GFR, Estimated: >60 mL/min (ref >60)
Glucose, Bld: 106 mg/dL — ABNORMAL HIGH (ref 70–99)
Potassium: 3.2 mmol/L — ABNORMAL LOW (ref 3.5–5.1)
Sodium: 137 mmol/L (ref 135–145)
Total Bilirubin: 0.4 mg/dL (ref 0.3–1.2)
Total Protein: 7.8 g/dL (ref 6.5–8.1)

## 2022-08-26 LAB — RAPID URINE DRUG SCREEN, HOSP PERFORMED
Amphetamines: NOT DETECTED
Barbiturates: NOT DETECTED
Benzodiazepines: NOT DETECTED
Cocaine: NOT DETECTED
Opiates: NOT DETECTED
Tetrahydrocannabinol: POSITIVE — AB

## 2022-08-26 LAB — I-STAT BETA HCG BLOOD, ED (MC, WL, AP ONLY): I-stat hCG, quantitative: 9.8 m[IU]/mL — ABNORMAL HIGH (ref <5)

## 2022-08-26 LAB — ETHANOL: Alcohol, Ethyl (B): <10 mg/dL (ref <10)

## 2022-08-26 LAB — ACETAMINOPHEN LEVEL: Acetaminophen (Tylenol), Serum: <10 ug/mL — ABNORMAL LOW (ref 10–30)

## 2022-08-26 NOTE — ED Triage Notes (Addendum)
Patient [place] with EMS [place] [facility] reports chronic low back pain for several months , also reports anxiety with flight of ideas/agitated and paranoia at arrival , poor historian . No SI or HI.

## 2022-08-26 NOTE — ED Notes (Signed)
Called pt for vitals with no answer and pt not seen outside

## 2022-08-28 ENCOUNTER — Emergency Department (HOSPITAL_COMMUNITY)
Admission: EM | Admit: 2022-08-28 | Discharge: 2022-08-30 | Disposition: A | Discharge status: Other Acute Inpatient Hospital | Payer: Medicare HMO | Attending: Emergency Medicine | Admitting: Emergency Medicine

## 2022-08-28 ENCOUNTER — Encounter (HOSPITAL_COMMUNITY): Payer: Self-pay

## 2022-08-28 ENCOUNTER — Ambulatory Visit (HOSPITAL_COMMUNITY)
Admission: RE | Admit: 2022-08-28 | Discharge: 2022-08-28 | Disposition: A | Discharge status: Home / Self Care | Payer: Medicare HMO | Attending: Psychiatry | Admitting: Psychiatry

## 2022-08-28 ENCOUNTER — Emergency Department (HOSPITAL_COMMUNITY): Payer: Medicare HMO

## 2022-08-28 DIAGNOSIS — R462 Strange and inexplicable behavior: Secondary | ICD-10-CM | POA: Diagnosis present

## 2022-08-28 DIAGNOSIS — Z20822 Contact with and (suspected) exposure to covid-19: Secondary | ICD-10-CM | POA: Diagnosis not present

## 2022-08-28 DIAGNOSIS — F17210 Nicotine dependence, cigarettes, uncomplicated: Secondary | ICD-10-CM | POA: Diagnosis not present

## 2022-08-28 DIAGNOSIS — Z79899 Other long term (current) drug therapy: Secondary | ICD-10-CM | POA: Diagnosis not present

## 2022-08-28 DIAGNOSIS — R002 Palpitations: Secondary | ICD-10-CM | POA: Diagnosis not present

## 2022-08-28 DIAGNOSIS — K0889 Other specified disorders of teeth and supporting structures: Secondary | ICD-10-CM | POA: Diagnosis not present

## 2022-08-28 DIAGNOSIS — R110 Nausea: Secondary | ICD-10-CM | POA: Diagnosis not present

## 2022-08-28 DIAGNOSIS — M791 Myalgia, unspecified site: Secondary | ICD-10-CM | POA: Diagnosis not present

## 2022-08-28 DIAGNOSIS — F29 Unspecified psychosis not due to a substance or known physiological condition: Secondary | ICD-10-CM | POA: Diagnosis not present

## 2022-08-28 DIAGNOSIS — R630 Anorexia: Secondary | ICD-10-CM | POA: Diagnosis not present

## 2022-08-28 DIAGNOSIS — R202 Paresthesia of skin: Secondary | ICD-10-CM | POA: Diagnosis not present

## 2022-08-28 LAB — RESP PANEL BY RT-PCR (FLU A&B, COVID) ARPGX2
Influenza A by PCR: NEGATIVE
Influenza B by PCR: NEGATIVE
SARS Coronavirus 2 by RT PCR: NEGATIVE

## 2022-08-28 LAB — RAPID URINE DRUG SCREEN, HOSP PERFORMED
Amphetamines: NOT DETECTED
Barbiturates: NOT DETECTED
Benzodiazepines: NOT DETECTED
Cocaine: NOT DETECTED
Opiates: NOT DETECTED
Tetrahydrocannabinol: POSITIVE — AB

## 2022-08-28 LAB — URINALYSIS, ROUTINE W REFLEX MICROSCOPIC
Bacteria, UA: NONE SEEN
Bilirubin Urine: NEGATIVE
Glucose, UA: NEGATIVE mg/dL
Hgb urine dipstick: NEGATIVE
Ketones, ur: 5 mg/dL — AB
Nitrite: NEGATIVE
Protein, ur: 30 mg/dL — AB
Specific Gravity, Urine: 1.024 (ref 1.005–1.030)
pH: 5 (ref 5.0–8.0)

## 2022-08-28 LAB — CBC WITH DIFFERENTIAL/PLATELET
Abs Immature Granulocytes: 0.02 10*3/uL (ref 0.00–0.07)
Basophils Absolute: 0 10*3/uL (ref 0.0–0.1)
Basophils Relative: 1 %
Eosinophils Absolute: 0 10*3/uL (ref 0.0–0.5)
Eosinophils Relative: 0 %
HCT: 42.3 % (ref 36.0–46.0)
Hemoglobin: 14.2 g/dL (ref 12.0–15.0)
Immature Granulocytes: 0 %
Lymphocytes Relative: 26 %
Lymphs Abs: 2 10*3/uL (ref 0.7–4.0)
MCH: 31.4 pg (ref 26.0–34.0)
MCHC: 33.6 g/dL (ref 30.0–36.0)
MCV: 93.6 fL (ref 80.0–100.0)
Monocytes Absolute: 0.7 10*3/uL (ref 0.1–1.0)
Monocytes Relative: 9 %
Neutro Abs: 5 10*3/uL (ref 1.7–7.7)
Neutrophils Relative %: 64 %
Platelets: 355 10*3/uL (ref 150–400)
RBC: 4.52 MIL/uL (ref 3.87–5.11)
RDW: 13.2 % (ref 11.5–15.5)
WBC: 7.8 10*3/uL (ref 4.0–10.5)
nRBC: 0 % (ref 0.0–0.2)

## 2022-08-28 LAB — COMPREHENSIVE METABOLIC PANEL
ALT: 18 U/L (ref 0–44)
AST: 19 U/L (ref 15–41)
Albumin: 4.4 g/dL (ref 3.5–5.0)
Alkaline Phosphatase: 73 U/L (ref 38–126)
Anion gap: 8 (ref 5–15)
BUN: 12 mg/dL (ref 6–20)
CO2: 26 mmol/L (ref 22–32)
Calcium: 10.2 mg/dL (ref 8.9–10.3)
Chloride: 104 mmol/L (ref 98–111)
Creatinine, Ser: 0.69 mg/dL (ref 0.44–1.00)
GFR, Estimated: >60 mL/min (ref >60)
Glucose, Bld: 101 mg/dL — ABNORMAL HIGH (ref 70–99)
Potassium: 3.6 mmol/L (ref 3.5–5.1)
Sodium: 138 mmol/L (ref 135–145)
Total Bilirubin: 1 mg/dL (ref 0.3–1.2)
Total Protein: 8 g/dL (ref 6.5–8.1)

## 2022-08-28 LAB — LIPASE, BLOOD: Lipase: 30 U/L (ref 11–51)

## 2022-08-28 MED ORDER — ONDANSETRON 8 MG PO TBDP: 8.0000 mg | ORAL_TABLET | Freq: Once | ORAL | Status: DC

## 2022-08-28 NOTE — ED Provider Triage Note (Signed)
Emergency Medicine Provider Triage Evaluation Note  [name] , a 51 y.o. female  was evaluated in triage.  Pt complains of numerous, vague unrelated complaints.  Her daughter accompanies her who is a CNA who provides most of the history.  She states that patient was on her way to [facility] when she experienced what she describes as either seizures or strokelike symptoms.  This includes shaking, gritting her teeth and possible facial droop.  Daughter states that she has had these numerous times in the past, but has never had this evaluated.  Not sure if she is on antipsychotic medications.  Patient states she feels numbness and tingling all over her body.  ROS pan positive.  Endorses constipation for several weeks, nausea, tremors, palpitations.  She states that she has not eaten in quite a while because she believes that her teeth are decaying in her mouth and that she cannot eat because of this.  When I asked her daughter what exactly was concerning enough that [facility] wanted her to come to the emergency department, she states it was for the seizure/strokelike symptoms she experienced in route.  Daughter notes that patient's speech is slow.  Review of Systems  Positive: As above Negative:   Physical Exam  BP (!) 145/91 (BP Location: Right Arm)   Pulse 89   Temp 99.4 F (37.4 C) (Oral)   Resp 18   SpO2 96%  Gen:   Awake, no distress   Resp:  Normal effort  MSK:   Moves extremities without difficulty  Other:  Neuro exam difficult.  Global subjective sensation diminished.  Strong and equal grip strength.  Eyes PERRL.  Overall poor dentition.  Medical Decision Making  Medically screening exam initiated at 1:22 PM.  Appropriate orders placed.  [name] was informed that the remainder of the evaluation will be completed by another provider, this initial triage assessment does not replace that evaluation, and the importance of remaining in the ED until their evaluation is complete.      Tonye Pearson, [place] 08/28/22 1327

## 2022-08-28 NOTE — ED Triage Notes (Signed)
Pt arrives via [facility] from [facility]. Pt's daughter states that she was trying to get pt evaluated due to her generalized body aches, poor PO intake, and overall disposition. Pt states that daughter has psych history and similar episodes occur when off of medications. Pt states she is unsure when the last time she took her medication. Pt with multiple complaints including: palpitations, toothache, body aches, nausea, etc.

## 2022-08-28 NOTE — ED Provider Notes (Signed)
[facility] DEPT Provider Note   CSN: [id number] Arrival date & time: 08/28/22  1222     History  Chief Complaint  Patient presents with   Psychiatric Evaluation   Generalized Body Aches    [name] is a 51 y.o. female with history of ADHD, GAD, MDD, previous history of drug psychosis who presents to the emergency department from [facility] for evaluation of psychiatric complaints as well as potential strokelike symptoms.  Patient's daughter states that she was taking patient to [facility] to get evaluated due for her overall bizarre behavior.  She states patient has been complaining of body pain from head to toe, numbness head to toe, decreased oral intake and overall bizarre behavior.  On the way to [facility], patient reportedly began jerking with potential seizure-like or strokelike activity.  She was sent here by [facility] for evaluation.  Patient states that she has not eaten in several weeks and that she has not had a bowel movement in 7 weeks.  She is also endorsing palpitations, tooth aches and nausea.  She states that she cannot eat because she feels that her body and her teeth are decaying.  Patient is unsure of the last time she took her medication.  Daughter notes that similar behavior has happened in the past when patient is off of her medications.  HPI     Home Medications Prior to Admission medications   Medication Sig Start Date End Date Taking? Authorizing Provider  ALPRAZolam Duanne Moron) 1 MG tablet Take 1 tablet (1 mg total) by mouth 2 (two) times daily as needed. for anxiety 06/14/22  Yes Salley Slaughter, NP      Allergies    Penicillins    Review of Systems   Review of Systems  Constitutional:  Negative for fever.    Physical Exam Updated Vital Signs BP 126/79 (BP Location: Left Arm)   Pulse 87   Temp 98.2 F (36.8 C) (Oral)   Resp 16   SpO2 97%  Physical Exam Vitals and nursing note reviewed.  Constitutional:      General: She is not in  acute distress.    Appearance: She is not ill-appearing.  HENT:     Head: Atraumatic.     Mouth/Throat:     Comments: Overall poor dentition with 2 bottom canines remaining, no evidence of infection or fracture Eyes:     Conjunctiva/sclera: Conjunctivae normal.  Cardiovascular:     Rate and Rhythm: Normal rate and regular rhythm.     Pulses: Normal pulses.     Heart sounds: No murmur heard. Pulmonary:     Effort: Pulmonary effort is normal. No respiratory distress.     Breath sounds: Normal breath sounds.  Abdominal:     General: Abdomen is flat. There is no distension.     Palpations: Abdomen is soft.     Tenderness: There is no abdominal tenderness.  Genitourinary:    Comments: No fecal impaction on DRE Musculoskeletal:        General: Normal range of motion.     Cervical back: Normal range of motion.  Skin:    General: Skin is warm and dry.     Capillary Refill: Capillary refill takes less than 2 seconds.  Neurological:     General: No focal deficit present.     Mental Status: She is alert.     Comments: Speech is clear, able to follow commands CN III-XII intact Normal strength in upper and lower extremities bilaterally  including dorsiflexion and plantar flexion, strong and equal grip strength  Moves extremities without ataxia, coordination intact  Tremors noted to the bilateral upper extremities No pronator drift    Psychiatric:        Mood and Affect: Mood normal.     ED Results / Procedures / Treatments   Labs (all labs ordered are listed, but only abnormal results are displayed) Labs Reviewed  COMPREHENSIVE METABOLIC PANEL - Abnormal; Notable for the following components:      Result Value   Glucose, Bld 101 (*)    All other components within normal limits  URINALYSIS, ROUTINE W REFLEX MICROSCOPIC - Abnormal; Notable for the following components:   Color, Urine AMBER (*)    APPearance CLOUDY (*)    Ketones, ur 5 (*)    Protein, ur 30 (*)     Leukocytes,Ua TRACE (*)    All other components within normal limits  RESP PANEL BY RT-PCR (FLU A&B, COVID) ARPGX2  CBC WITH DIFFERENTIAL/PLATELET  LIPASE, BLOOD  RAPID URINE DRUG SCREEN, HOSP PERFORMED    EKG None  Radiology CT Head Wo Contrast  Result Date: 08/28/2022 CLINICAL DATA:  Mental status change EXAM: CT HEAD WITHOUT CONTRAST TECHNIQUE: Contiguous axial images were obtained from the base of the skull through the vertex without intravenous contrast. RADIATION DOSE REDUCTION: This exam was performed according to the departmental dose-optimization program which includes automated exposure control, adjustment of the mA and/or kV according to patient size and/or use of iterative reconstruction technique. COMPARISON:  prior head CT 03/15/2016 FINDINGS: Brain: No evidence of acute infarction, hemorrhage, hydrocephalus, extra-axial collection or mass lesion/mass effect. Vascular: No hyperdense vessel or unexpected calcification. Skull: Normal. Negative for fracture or focal lesion. Sinuses/Orbits: No acute finding. Other: None. IMPRESSION: Negative head CT. Electronically Signed   By: Jacqulynn Cadet M.D.   On: 08/28/2022 13:45    Procedures Procedures    Medications Ordered in ED Medications  ondansetron (ZOFRAN-ODT) disintegrating tablet 8 mg (8 mg Oral Patient Refused/Not Given 08/28/22 1736)    ED Course/ Medical Decision Making/ A&P                           Medical Decision Making Amount and/or Complexity of Data Reviewed Labs: ordered. Radiology: ordered.  Risk Prescription drug management.   Social determinants of health:  Social History   Socioeconomic History   Marital status: Widowed    Spouse name: Not on file   Number of children: Not on file   Years of education: Not on file   Highest education level: Not on file  Occupational History   Not on file  Tobacco Use   Smoking status: Some Days    Packs/day: 1.00    Years: 20.00    Total pack years:  20.00    Types: Cigarettes   Smokeless tobacco: Never  Vaping Use   Vaping Use: Never used  Substance and Sexual Activity   Alcohol use: No   Drug use: No   Sexual activity: Not on file  Other Topics Concern   Not on file  Social History Narrative   Not on file   Social Determinants of Health   Financial Resource Strain: Not on file  Food Insecurity: Not on file  Transportation Needs: Not on file  Physical Activity: Not on file  Stress: Not on file  Social Connections: Not on file  Intimate Partner Violence: Not on file     Initial impression:  This patient presents to the ED for concern of numerous complaints as described in HPI, this involves an extensive number of treatment options, and is a complaint that carries with it a high risk of complications and morbidity.     Comorbidities affecting care:  Per HPI  Additional history obtained: Daughter  Lab Tests  I Ordered, reviewed, and interpreted labs and EKG.  The pertinent results include:  CMP, CBC, lipase and urinalysis normal Respiratory panel negative  Imaging Studies ordered:  I ordered imaging studies including  CT head without acute findings I independently visualized and interpreted imaging and I agree with the radiologist interpretation.    Medicines ordered and prescription drug management:  I ordered medication including: Zofran Reevaluation of the patient after these medicines showed that the patient stayed the same I have reviewed the patients home medicines and have made adjustments as needed  Consultations Obtained:  I requested consultation with TTS. Currently pending   ED Course/Re-evaluation: Presents in no obvious distress.  Vitals are without significant abnormality.  Neuro exam normal.  Physical exam technically difficult given the patient is complaining of pan positive symptoms and endorsing pain when pressing on any part of her body.  She reports no bowel movements for 3 weeks.  I  discussed with her that I need to do a rectal exam to determine presence of impaction.  Patient agreed and there was no impaction noted on DRE.  She became very upset afterwards saying that I personally caused her impaction.  She was given Zofran 8 mg p.o. for her complaints of nausea.  Ultimately, her labs were all normal.  Urine shows evidence of contamination without obvious signs of infection.  She is medically cleared and ready for TTS consult.  Disposition:  After consideration of the diagnostic results, physical exam, history and the patients response to treatment feel that the patent would benefit from TTS evaluation.   Bizarre behavior: Plan and management as described above. Pending TTS recommendations for dispo  Final Clinical Impression(s) / ED Diagnoses Final diagnoses:  Bizarre behavior    Rx / DC Orders ED Discharge Orders     None         Tonye Pearson, PA-C 08/28/22 1818    Godfrey Pick, MD 08/29/22 0111

## 2022-08-28 NOTE — ED Notes (Signed)
TTS consult at this time with Kirtland Bouchard, [facility], pt moved to a private area for consult form hall bed for HIPPA compliance.

## 2022-08-28 NOTE — ED Notes (Addendum)
Pt was not able to complete her TTS consult at this time, pt placed back in Hall-D, in front of nurses station for continuous observation. Unable to have conversation with pt per [place], [facility]

## 2022-08-28 NOTE — Progress Notes (Signed)
   Brenda R.  [name] is a 51 year old Caucasian female who presents voluntarily as a walk-in accompanied by her daughter to [facility] for evaluation.  At the lobby, patient become obtunded.  EMS was called and patient transferred to [facility] long ED hospital for medical clearance.  Charmaine Downs NP psychiatry.

## 2022-08-28 NOTE — BH Assessment (Signed)
TTS clinician attempted to complete assessment. Patient uncooperative. Estill Bamberg, RN informed. TTS will attempt at later time.

## 2022-08-29 DIAGNOSIS — F23 Brief psychotic disorder: Secondary | ICD-10-CM

## 2022-08-29 DIAGNOSIS — F29 Unspecified psychosis not due to a substance or known physiological condition: Secondary | ICD-10-CM | POA: Diagnosis not present

## 2022-08-29 MED ORDER — LORAZEPAM 1 MG PO TABS
1.0000 mg | ORAL_TABLET | Freq: Once | ORAL | Status: AC
  Administered 2022-08-29: 1 mg via ORAL
  Filled 2022-08-29: qty 1

## 2022-08-29 NOTE — ED Notes (Signed)
Pt appears to be resting, observed even RR and unlabored, blanket on pt for warmth and comfort, NAD noted, pt within view of staff for safety, hall secured, plan of care on going, no further concerns as of present

## 2022-08-29 NOTE — ED Notes (Signed)
Pt son at bedside.

## 2022-08-29 NOTE — ED Notes (Signed)
I rounded on this patient and as of 08/29/2022 patient has not been IVCed.

## 2022-08-29 NOTE — ED Provider Notes (Signed)
Emergency Medicine Observation Re-evaluation Note  [name] is a 51 y.o. female, seen on rounds today.  Pt initially presented to the ED for complaints of Psychiatric Evaluation and Generalized Body Aches Currently, the patient is awaiting TTS evaluation.  Pt does not want to talk to me.  She states she is fine and wants me to leave her alone.   Physical Exam  BP (!) 144/85   Pulse 76   Temp 98.9 F (37.2 C)   Resp 16   SpO2 100%  Physical Exam Vitals reviewed.  Constitutional:      Appearance: Normal appearance.  Cardiovascular:     Rate and Rhythm: Normal rate.  Pulmonary:     Effort: Pulmonary effort is normal.  Neurological:     General: No focal deficit present.     Mental Status: She is alert.  Psychiatric:        Mood and Affect: Mood normal.     ED Course / MDM  EKG:   I have reviewed the labs performed to date as well as medications administered while in observation.  Recent changes in the last 24 hours include no change  Plan  Current plan is for TTS assessment     Sidney Ace 08/29/22 6767    Pattricia Boss, MD 08/29/22 1057

## 2022-08-29 NOTE — ED Notes (Signed)
Pt ate 25% of her lunch.

## 2022-08-29 NOTE — ED Notes (Signed)
Pt appears to be resting, awake, observed even RR and unlabored, NAD noted, pt within view of staff for safety, hall bed remains secured, plan of care on going, no further concerns as of present

## 2022-08-29 NOTE — ED Notes (Signed)
Safety transport called to arrange transportation. Safety transport stated they will call back in 30 minutes

## 2022-08-29 NOTE — ED Notes (Signed)
Pt is still refusing vital reassessment.

## 2022-08-29 NOTE — ED Notes (Signed)
Pt did not attempt to eat breakfast. Pt continued to lay in bed.

## 2022-08-29 NOTE — ED Notes (Addendum)
Pt refused SPO2 and temp.Pt stated "if you have a vape we can talk".RN notified.

## 2022-08-29 NOTE — ED Notes (Signed)
Pt encouraged to use restroom and eat lunch. Pt stated she was "confused" on what she needed to do. Easily redirectable.

## 2022-08-29 NOTE — ED Notes (Signed)
Assisted pt to shower w/ son at standby per pt request. Charge RN approved. Pt able to shower and dress w/o assist. Pt given new clean linen and burgundy scrubs. Pt comfortable in bed w/ son at bedside att.

## 2022-08-29 NOTE — Progress Notes (Signed)
[name] is a 51 year old caucasian female with a history of GAD, social anxiety, who presented to [facility] as a walk-in. Per notes, the patient appeared obtunded and was transferred to [facility] for medical clearance. Attempted to evaluate the patient this morning. She is alert; however, refuses to participate in the assessment. States "why the fuck do I need to answer these questions?" Encouraged patient to participate in the assessment. She does mention that she does not recall why she presented to [facility], but she remembers "bits and pieces" of presenting to [facility] and then the ED. When attempting to assess orientation further, she states "how the hell I am I supposed to know what day it is." She refused to participate any further. Assessment terminated. Plan to evaluate later today.   UDS +THC. Bal <10.  Head CT 08/28/22 Negative  On chart review, it is noted that the patient presented to [facility] on 08/07/22 due to bizzare behavior. She was transferred to [facility] on 08/09/22. At this time, the only medication that is available in PTA meds is xanax 1 mg BID prn for anxiety. Per PDMP, the patient last filled xanax 1 mg #60 on 07/23/22.

## 2022-08-29 NOTE — ED Notes (Signed)
Pt refusing vital reassessment at this time.

## 2022-08-29 NOTE — Progress Notes (Cosign Needed Addendum)
On second evaluation attempt, patient sitting up on stretcher looking towards wall. She is alert, however, continues to refuse to answer some questions. Patient stares towards wall and does not look at provider. She acknowledges that she was recently discharged from [facility]. But does not provide any details. She states that she went to [facility] yesterday, because she is "crazy." She will not elaborate on what she means by "crazy." Although patient appears to be refusing to answer some questions. She also appears to be confused or having difficulty processing what is being asked at times.  At one point, she pulls down her pants down over her right groin and asks "do you want my tattoo too. I think you do." Patient again stopped participating.   UDS +THC. Bal <10.   Head CT 08/28/22 Negative  Patient has been medically cleared.  Patient discussed with Dr. Leverne Humbles. Will recommend inpatient treatment at this time.  Lynnda Shields RN, [facility] reports that the patient has been accepted to [facility] after 11 pm.

## 2022-08-29 NOTE — ED Notes (Signed)
Family updated as to patient's status. Pt daughter concerned regarding pt low intake. I let her know pt has been provided food and drink and pt refuses to eat and participate in TTS.

## 2022-08-30 ENCOUNTER — Encounter (HOSPITAL_COMMUNITY): Payer: Self-pay | Admitting: Nurse Practitioner

## 2022-08-30 ENCOUNTER — Inpatient Hospital Stay (HOSPITAL_COMMUNITY)
Admission: AD | Admit: 2022-08-30 | Discharge: 2022-09-01 | DRG: 885 | Disposition: A | Discharge status: Other Acute Inpatient Hospital | Payer: Medicare HMO | Source: Intra-hospital | Attending: Emergency Medicine | Admitting: Emergency Medicine

## 2022-08-30 ENCOUNTER — Other Ambulatory Visit: Payer: Self-pay

## 2022-08-30 DIAGNOSIS — F315 Bipolar disorder, current episode depressed, severe, with psychotic features: Secondary | ICD-10-CM | POA: Diagnosis not present

## 2022-08-30 DIAGNOSIS — R4701 Aphasia: Secondary | ICD-10-CM | POA: Diagnosis present

## 2022-08-30 DIAGNOSIS — Z20822 Contact with and (suspected) exposure to covid-19: Secondary | ICD-10-CM | POA: Diagnosis present

## 2022-08-30 DIAGNOSIS — R4182 Altered mental status, unspecified: Secondary | ICD-10-CM | POA: Diagnosis not present

## 2022-08-30 DIAGNOSIS — F19959 Other psychoactive substance use, unspecified with psychoactive substance-induced psychotic disorder, unspecified: Secondary | ICD-10-CM | POA: Diagnosis present

## 2022-08-30 DIAGNOSIS — F200 Paranoid schizophrenia: Secondary | ICD-10-CM | POA: Diagnosis not present

## 2022-08-30 DIAGNOSIS — Z91148 Patient's other noncompliance with medication regimen for other reason: Secondary | ICD-10-CM

## 2022-08-30 DIAGNOSIS — F333 Major depressive disorder, recurrent, severe with psychotic symptoms: Principal | ICD-10-CM | POA: Diagnosis present

## 2022-08-30 DIAGNOSIS — F3164 Bipolar disorder, current episode mixed, severe, with psychotic features: Secondary | ICD-10-CM | POA: Diagnosis not present

## 2022-08-30 DIAGNOSIS — Z8661 Personal history of infections of the central nervous system: Secondary | ICD-10-CM | POA: Diagnosis not present

## 2022-08-30 DIAGNOSIS — F1990 Other psychoactive substance use, unspecified, uncomplicated: Secondary | ICD-10-CM | POA: Diagnosis not present

## 2022-08-30 DIAGNOSIS — F4010 Social phobia, unspecified: Secondary | ICD-10-CM | POA: Diagnosis present

## 2022-08-30 DIAGNOSIS — F1490 Cocaine use, unspecified, uncomplicated: Secondary | ICD-10-CM | POA: Diagnosis not present

## 2022-08-30 DIAGNOSIS — F4323 Adjustment disorder with mixed anxiety and depressed mood: Principal | ICD-10-CM

## 2022-08-30 DIAGNOSIS — F410 Panic disorder [episodic paroxysmal anxiety] without agoraphobia: Secondary | ICD-10-CM | POA: Diagnosis present

## 2022-08-30 DIAGNOSIS — Z880 Allergy status to penicillin: Secondary | ICD-10-CM | POA: Diagnosis not present

## 2022-08-30 DIAGNOSIS — Z634 Disappearance and death of family member: Secondary | ICD-10-CM | POA: Diagnosis not present

## 2022-08-30 DIAGNOSIS — F12159 Cannabis abuse with psychotic disorder, unspecified: Secondary | ICD-10-CM | POA: Diagnosis present

## 2022-08-30 DIAGNOSIS — F1290 Cannabis use, unspecified, uncomplicated: Secondary | ICD-10-CM

## 2022-08-30 DIAGNOSIS — X789XXA Intentional self-harm by unspecified sharp object, initial encounter: Secondary | ICD-10-CM | POA: Diagnosis present

## 2022-08-30 DIAGNOSIS — F909 Attention-deficit hyperactivity disorder, unspecified type: Secondary | ICD-10-CM | POA: Diagnosis present

## 2022-08-30 DIAGNOSIS — Z87891 Personal history of nicotine dependence: Secondary | ICD-10-CM

## 2022-08-30 DIAGNOSIS — Z79899 Other long term (current) drug therapy: Secondary | ICD-10-CM

## 2022-08-30 DIAGNOSIS — F14159 Cocaine abuse with cocaine-induced psychotic disorder, unspecified: Secondary | ICD-10-CM | POA: Diagnosis present

## 2022-08-30 DIAGNOSIS — F332 Major depressive disorder, recurrent severe without psychotic features: Secondary | ICD-10-CM | POA: Diagnosis present

## 2022-08-30 DIAGNOSIS — E441 Mild protein-calorie malnutrition: Secondary | ICD-10-CM | POA: Diagnosis not present

## 2022-08-30 DIAGNOSIS — F13131 Sedative, hypnotic or anxiolytic abuse with withdrawal delirium: Secondary | ICD-10-CM | POA: Diagnosis present

## 2022-08-30 DIAGNOSIS — F1410 Cocaine abuse, uncomplicated: Secondary | ICD-10-CM | POA: Insufficient documentation

## 2022-08-30 DIAGNOSIS — F29 Unspecified psychosis not due to a substance or known physiological condition: Secondary | ICD-10-CM | POA: Diagnosis not present

## 2022-08-30 DIAGNOSIS — F411 Generalized anxiety disorder: Secondary | ICD-10-CM | POA: Diagnosis present

## 2022-08-30 DIAGNOSIS — F39 Unspecified mood [affective] disorder: Secondary | ICD-10-CM | POA: Diagnosis not present

## 2022-08-30 MED ORDER — ARIPIPRAZOLE 5 MG PO TABS
5.0000 mg | ORAL_TABLET | Freq: Every day | ORAL | Status: DC
  Filled 2022-08-30 (×2): qty 1

## 2022-08-30 MED ORDER — TRAZODONE HCL 50 MG PO TABS: 50.0000 mg | ORAL_TABLET | Freq: Every evening | ORAL | Status: DC | PRN

## 2022-08-30 MED ORDER — MAGNESIUM HYDROXIDE 400 MG/5ML PO SUSP: 30.0000 mL | Freq: Every day | ORAL | Status: DC | PRN

## 2022-08-30 MED ORDER — NICOTINE 14 MG/24HR TD PT24: 14.0000 mg | MEDICATED_PATCH | Freq: Every day | TRANSDERMAL | Status: DC | PRN

## 2022-08-30 MED ORDER — HYDROXYZINE HCL 25 MG PO TABS
25.0000 mg | ORAL_TABLET | Freq: Three times a day (TID) | ORAL | Status: DC
  Administered 2022-08-30: 25 mg via ORAL
  Filled 2022-08-30 (×4): qty 1

## 2022-08-30 MED ORDER — VITAMIN B-1 100 MG PO TABS: 100.0000 mg | ORAL_TABLET | Freq: Every day | ORAL | Status: DC

## 2022-08-30 MED ORDER — LORAZEPAM 1 MG PO TABS
1.0000 mg | ORAL_TABLET | Freq: Four times a day (QID) | ORAL | Status: DC | PRN
  Administered 2022-08-30: 1 mg via ORAL
  Filled 2022-08-30: qty 1

## 2022-08-30 MED ORDER — ADULT MULTIVITAMIN W/MINERALS CH
1.0000 | ORAL_TABLET | Freq: Every day | ORAL | Status: DC
  Administered 2022-09-01: 1 via ORAL
  Filled 2022-08-30 (×4): qty 1

## 2022-08-30 MED ORDER — ACETAMINOPHEN 325 MG PO TABS
650.0000 mg | ORAL_TABLET | Freq: Four times a day (QID) | ORAL | Status: DC | PRN
  Filled 2022-08-30: qty 2

## 2022-08-30 MED ORDER — MELATONIN 5 MG PO TABS
5.0000 mg | ORAL_TABLET | Freq: Every evening | ORAL | Status: DC | PRN
  Filled 2022-08-30: qty 1

## 2022-08-30 MED ORDER — VITAMIN B-1 100 MG PO TABS
100.0000 mg | ORAL_TABLET | Freq: Three times a day (TID) | ORAL | Status: DC
  Administered 2022-08-30 – 2022-09-01 (×2): 100 mg via ORAL
  Filled 2022-08-30 (×10): qty 1

## 2022-08-30 MED ORDER — TRAZODONE HCL 50 MG PO TABS
50.0000 mg | ORAL_TABLET | Freq: Every day | ORAL | Status: DC
  Filled 2022-08-30: qty 1

## 2022-08-30 MED ORDER — ENSURE ENLIVE PO LIQD
237.0000 mL | Freq: Three times a day (TID) | ORAL | Status: DC
  Administered 2022-08-30 – 2022-08-31 (×2): 237 mL via ORAL
  Filled 2022-08-30 (×9): qty 237

## 2022-08-30 MED ORDER — GABAPENTIN 400 MG PO CAPS
400.0000 mg | ORAL_CAPSULE | Freq: Three times a day (TID) | ORAL | Status: DC
  Administered 2022-09-01: 400 mg via ORAL
  Filled 2022-08-30 (×9): qty 1

## 2022-08-30 MED ORDER — ALUM & MAG HYDROXIDE-SIMETH 200-200-20 MG/5ML PO SUSP: 30.0000 mL | ORAL | Status: DC | PRN

## 2022-08-30 MED ORDER — ADULT MULTIVITAMIN W/MINERALS CH
1.0000 | ORAL_TABLET | Freq: Every day | ORAL | Status: DC
  Administered 2022-08-30: 1 via ORAL
  Filled 2022-08-30 (×3): qty 1

## 2022-08-30 MED ORDER — HYDROXYZINE HCL 25 MG PO TABS
25.0000 mg | ORAL_TABLET | Freq: Four times a day (QID) | ORAL | Status: DC | PRN
  Administered 2022-09-01: 25 mg via ORAL
  Filled 2022-08-30 (×2): qty 1

## 2022-08-30 MED ORDER — GABAPENTIN 800 MG PO TABS
400.0000 mg | ORAL_TABLET | Freq: Three times a day (TID) | ORAL | Status: DC
  Filled 2022-08-30 (×3): qty 0.5

## 2022-08-30 MED ORDER — LOPERAMIDE HCL 2 MG PO CAPS: 2.0000 mg | ORAL_CAPSULE | ORAL | Status: DC | PRN

## 2022-08-30 MED ORDER — HYDROXYZINE HCL 25 MG PO TABS
25.0000 mg | ORAL_TABLET | Freq: Three times a day (TID) | ORAL | Status: DC | PRN
  Filled 2022-08-30: qty 1

## 2022-08-30 MED ORDER — ONDANSETRON 4 MG PO TBDP: 4.0000 mg | ORAL_TABLET | Freq: Four times a day (QID) | ORAL | Status: DC | PRN

## 2022-08-30 NOTE — Hospital Course (Signed)
Add mirtazepine?

## 2022-08-30 NOTE — BHH Suicide Risk Assessment (Signed)
[facility] Admission Suicide Risk Assessment   Nursing information obtained from:  Patient Demographic factors:  Caucasian, Unemployed Current Mental Status:  NA Loss Factors:  Decline in physical health, Financial problems / change in socioeconomic status Historical Factors:  Family history of mental illness or substance abuse Risk Reduction Factors:  Positive social support, Living with another person, especially a relative  Total Time spent with patient: 1.5 hours Principal Problem: MDD (major depressive disorder), recurrent, severe, with psychosis ([place]) Diagnosis:  Principal Problem:   MDD (major depressive disorder), recurrent, severe, with psychosis ([place]) Active Problems:   GAD (generalized anxiety disorder)   Social anxiety disorder   Attention deficit hyperactivity disorder (ADHD)   Cannabis use disorder   Cocaine use disorder ([place])   Subjective Data: [name] is a 51 y.o., female with a past psychiatric history significant for MDD, ADHD, GAD, social anxiety who presents to the [facility] voluntarily from [facility] Emergency department for evaluation and management of bizarre behavior and multiple somatic complaints, potential seizure-like activity  Continued Clinical Symptoms:  Alcohol Use Disorder Identification Test Final Score (AUDIT): 1  Mental Status Exam: This patient interview was conducted in-person in the presence of the resident physician and ancillary support staff (Education officer, museum) . I personally interviewed the patient with members of the team contributing their own questions, and by the end of the interview, established poor rapport with the patient.   Basic Cognition: [name] is alert; she is oriented to person, oriented to place, and oriented to time.   Appearance and Grooming: White female with documented age of 51 y.o. who presents congruently to her documented sex. Patient is  mostly seated and sometimes gets up to stand  in a/n  upright posture; she appears older than documented age, as evidenced by her lack of teeth and looking frail , and is  initially walking around naked in her room, though eventually wearing a hospital gown . Grooming appears overall disheveled: hair is clean and unkempt and fingernails appear dirty. The patient has no noticeable scent or odor. There are is visible scarring on her wrists under her tattoos . There is visible evidence of self harm, namely, the aforementioned scarring on wrists .   Behavior: The patient appears visibly distressed, and during the interview, was restless, as evidenced by frequently getting up and threatening to leave the room, agitated, as evidenced by raising her voice and accusing members of the team of being out to get her and treating her poorly, required frequent redirection, and behaving inappropriately to scenario, as evidenced by initially walking around naked as well as trying to leave the interview ; she was able to follow commands though sometimes noncompliant to requests to sit down and made minimal eye contact.   Attitude: Patient was uncooperative, as evidenced by threatening to leave room and suspicious, as evidenced by refusing to answer certain questions  during the interview.   Motor activity: The patient's movement speed was normal; her gait was normal. There was no notable abnormal facial movements and no notable abnormal extremity movements.   Speech: The patient's speech was clear, fluent, with good articulation, and with appropriately placed inflections. The volume of her speech was normal and excessive in quantity. The rate was normal with a normal rhythm. Responses were normal in latency. There were no abnormal patterns in speech.   Mood: "Scared"   Affect: Patient's affect is dysphoric, tearful, irritable, and angry with broad range and labile fluctuations; her affect is  congruent with her stated mood.   Thought Content The patient experiences  no hallucinations. The patient describes persecutory delusions, specifically that everyone is out to get her ; she described no misperceptions of stimuli (illusions). The patient denies feelings of derealization and denies feelings of depersonalization. The patient; she denies thought insertion, denies thought withdrawal, denies thought interruption, and denies thought broadcasting.   Patient denies active suicidal intent and denies passive suicidal ideation; she denies homicidal intent.   Thought Process The patient's thought process is tangential, often straying from the topic and questions at hand .   Insight The patient demonstrates poor insight, as evidenced by an inability to recognize her mental health conditions.   Judgement The patient demonstrates poor judgement, as evidenced by unwillingness to seek help / treatment and unwillingness to participate in the patient interview .   Expanded Cognitive Exam: A more comprehensive cognitive exam is not indicated at this time.  Physical Exam Vitals and nursing note reviewed.  Constitutional:      General: She is in acute distress.     Comments: malnourished  HENT:     Head: Normocephalic and atraumatic.  Pulmonary:     Effort: Pulmonary effort is normal.  Neurological:     General: No focal deficit present.     Mental Status: She is alert.     Comments: Oriented to place, person, year and month. Not oriented to situation      Review of Systems  Constitutional:  Positive for malaise/fatigue and weight loss.  Respiratory:  Positive for shortness of breath.   Cardiovascular:  Positive for chest pain.  Gastrointestinal:  Positive for nausea and vomiting.   Blood pressure (!) 125/99, pulse 90, temperature 97.7 F (36.5 C), temperature source Oral, resp. rate 18, height '5\' 4"'$  (1.626 m), weight 47.2 kg, SpO2 98 %. Body mass index is 17.85 kg/m.  COGNITIVE FEATURES THAT CONTRIBUTE TO RISK:  Closed-mindedness, Loss of executive  function, and Thought constriction (tunnel vision)    SUICIDE RISK:  Acute Risk:  Minimal: No identifiable suicidal ideation.  Patients presenting with no risk factors but with morbid ruminations; may be classified as minimal risk based on the severity of the depressive symptoms  Chronic Risk:  Minimal: No identifiable suicidal ideation.  Patients presenting with no risk factors but with morbid ruminations; may be classified as minimal risk based on the severity of the depressive symptoms  PLAN OF CARE: see H&P for full plan of care  I certify that inpatient services furnished can reasonably be expected to improve the patient's condition.   Camelia Phenes, MD 08/30/2022, 2:31 PM

## 2022-08-30 NOTE — Progress Notes (Signed)
Patient has been taking her clothes off, getting them wet, then becoming frustrated when we have to dry them. Patient continues to  become frustrated when redirected about coming out into the hall without clothes on. She states she is hungry but refuses to eat our food here, she states it is poisoned. Patient has been moved to room 506-1 for safety of patient.

## 2022-08-30 NOTE — Progress Notes (Signed)
   Pt complained of pain, anxiety, and was offered Melatonin for sleep.  Pt declined PRN Tylenol, Hydroxyzine.  Pt agreed to take PRN Melatonin but then spit it out stating, "I can't swallow it because I have no teeth."  Melatonin tab recovered and wasted.  Pt is safe on the unit, in bed, with Q15 minute safety checks.

## 2022-08-30 NOTE — H&P (Signed)
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 Lab Results  Component Value Date   PROLACTIN 19.2 11/13/2020    Lipid Panel: Lab Results  Component Value Date   CHOL 236 (H) 08/08/2022   TRIG 69 08/08/2022   HDL 89 08/08/2022   CHOLHDL 2.7 08/08/2022   VLDL 14 08/08/2022   LDLCALC 133 (H) 08/08/2022   LDLCALC 133 (H) 07/24/2022    HbgA1c: Hgb A1c MFr Bld (%)  Date Value  08/08/2022 5.0    TSH: TSH (uIU/mL)  Date Value  08/08/2022 1.455    EKG monitoring: QTc: 438  4. Group Therapy:  -- Encouraged patient to participate in unit milieu and in scheduled group therapies   -- Short Term Goals: Ability to identify changes in lifestyle to reduce recurrence of condition will improve, Ability to verbalize feelings will improve, Ability to demonstrate self-control will improve,  Ability to identify and develop effective coping behaviors will improve, Ability to maintain clinical measurements within normal limits will improve, Compliance with prescribed medications will improve, and Ability to identify triggers associated with substance abuse/mental health issues will improve  -- Long Term Goals: Improvement in symptoms so as ready for discharge -- Patient is encouraged to participate in group therapy while admitted to the psychiatric unit. -- We will address other chronic and acute stressors, which contributed to the patient's MDD (major depressive disorder), recurrent, severe, with psychosis ([place]) in order to reduce the risk of self-harm at discharge.  5. Discharge Planning:   -- Social work and case management to assist with discharge planning and identification of hospital follow-up needs prior to discharge  -- Estimated LOS: 5-7 days  -- Discharge Concerns: Need to establish a safety plan; Medication compliance and effectiveness  -- Discharge Goals: Return home with outpatient referrals for mental health follow-up including medication management/psychotherapy    Total Time Spent in Direct Patient Care:  I personally spent 120 minutes on the unit in direct patient care. The direct patient care time included face-to-face time with the patient, reviewing the patient's chart, communicating with other professionals, and coordinating care. Greater than 50% of this time was spent in counseling or coordinating care with the patient regarding goals of hospitalization, psycho-education, and discharge planning needs.   I certify that inpatient services furnished can reasonably be expected to improve the patient's condition.    I discussed my assessment, planned testing and intervention for the patient with Dr. Aneta Mins who agrees with my formulated course  of action.   Camelia Phenes, MD, PGY-1 9/14/20231:24 PM
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
## 2022-08-30 NOTE — Progress Notes (Signed)
NUTRITION ASSESSMENT  RD consulted for nutritional assessment.  INTERVENTION: 1. Supplements: Ensure Plus High Protein po TID, each supplement provides 350 kcal and 20 grams of protein.  2. Continue current vitamin regimen and replete if vitamin levels are low 3. Consider transfer to [facility] if refuses to eat/drink for >3 days  NUTRITION DIAGNOSIS: Unintentional weight loss related to sub-optimal intake as evidenced by pt report.   Goal: Pt to meet >/= 90% of their estimated nutrition needs.  Monitor:  PO intake  Assessment:  51 y.o. female with history of ADHD, GAD, MDD, previous history of drug psychosis who presents to the emergency department from [facility] for evaluation of psychiatric complaints as well as potential strokelike symptoms.  Per staff, pt reports feeling hungry but is suspicious of eating and drinking as she thinks it's poisoned. Pt with poor dentition, lost her dentures.  Pt reported not eating for several weeks.   Meets criteria for severe malnutrition in the setting of social/environmental circumstances given significant weight loss and energy intake </=50% for >/= 1 month.   If patient continues to refuse to eat or drink, may need to return to [facility] for admission and IVF.   MD has ordered MVI and thiamine. Noted MD has ordered vitamin labs given likely malnutrition. Checking folate, Vitamin C, E, D, A, B-12 and thiamine levels.  Per weight records, pt has lost 31 lbs since December 2022 (23% wt loss x 8.5 months, significant for time frame).   Height: Ht Readings from Last 1 Encounters:  08/30/22 '5\' 4"'$  (1.626 m)    Weight: Wt Readings from Last 1 Encounters:  08/30/22 47.2 kg    Weight Hx: Wt Readings from Last 10 Encounters:  08/30/22 47.2 kg  12/13/21 61 kg  12/12/21 61 kg  08/18/21 59 kg  11/14/20 47.6 kg  11/13/20 47.6 kg  06/17/19 56.7 kg  02/05/18 60.9 kg  02/05/18 60.3 kg  02/01/18 62.4 kg    BMI:  Body mass index is 17.85 kg/m. Pt meets  criteria for underweight based on current BMI.  Estimated Nutritional Needs: Kcal: 25-30 kcal/kg Protein: > 1 gram protein/kg Fluid: 1 ml/kcal  Diet Order:  Diet Order             Diet regular Room service appropriate? Yes; Fluid consistency: Thin  Diet effective now                  Pt is also offered choice of unit snacks mid-morning and mid-afternoon.   Lab results and medications reviewed.   Clayton Bibles, MS, RD, LDN Inpatient Clinical Dietitian Contact information available via Amion

## 2022-08-30 NOTE — Plan of Care (Signed)
Per patient's pharmacy - [place], [place] [phone])  Patient had the following prescriptions:  - Alprazolam 1 mg BID PRN - Gabapentin 800 mg TID - Dextroamphetamine 20 mg BID  Last fill was beginning of August 2023

## 2022-08-30 NOTE — Progress Notes (Signed)
Pt too disorganized and lethargic at this time to take medications, pt able to take 1 mg PRN Ativan per [facility] , will continue to monitor to see if pt is able to take rest of HS medications.    08/30/22 2000  Psych Admission Type (Psych Patients Only)  Admission Status Voluntary  Psychosocial Assessment  Patient Complaints Isolation  Eye Contact Brief  Facial Expression Anxious  Affect Angry;Anxious  Speech Argumentative  Interaction Defensive  Motor Activity Slow  Appearance/Hygiene Disheveled  Behavior Characteristics Anxious;Agitated  Mood Anxious;Labile  Aggressive Behavior  Effect No apparent injury  Thought Process  Coherency Disorganized;Flight of ideas;Incoherent  Content Confabulation;Delusions;Blaming others  Delusions Paranoid  Perception WDL  Hallucination None reported or observed  Judgment Poor  Confusion Moderate  Danger to Self  Current suicidal ideation? Denies  Danger to Others  Danger to Others None reported or observed

## 2022-08-30 NOTE — Progress Notes (Signed)
Date:  08/30/2022 Time:  9:00 AM   Group Topic/Focus:    Goals Group:   The focus of this group is to help patients establish daily goals to achieve during treatment and discuss how the patient can incorporate goal setting into their daily lives to aide in recovery.   Orientation:   The focus of this group is to educate the patient on the purpose and policies of crisis stabilization and provide a format to answer questions about their admission.  The group details unit policies and expectations of patients while admitted.    Participation Level:  Did not attend   Participation Quality:  Did not attend   Affect:  Did not attend  Cognitive:  Did not attend   Insight: Did not attend   Engagement in Group:  Did not attend   Modes of Intervention:  Did not attend   Additional Comments:  N/A   Luanna Salk RN 08/30/2022 9:30AM

## 2022-08-30 NOTE — Plan of Care (Signed)
  Problem: Education: Goal: Emotional status will improve Outcome: Not Progressing Goal: Mental status will improve Outcome: Not Progressing Goal: Verbalization of understanding the information provided will improve Outcome: Not Progressing   Problem: Activity: Goal: Interest or engagement in activities will improve Outcome: Not Progressing Goal: Sleeping patterns will improve Outcome: Not Progressing   Problem: Coping: Goal: Ability to verbalize frustrations and anger appropriately will improve Outcome: Not Progressing Goal: Ability to demonstrate self-control will improve Outcome: Not Progressing   Problem: Health Behavior/Discharge Planning: Goal: Identification of resources available to assist in meeting health care needs will improve Outcome: Not Progressing Goal: Compliance with treatment plan for underlying cause of condition will improve Outcome: Not Progressing
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
## 2022-08-30 NOTE — Progress Notes (Signed)
[facility] LCSW Progress Note   08/30/2022    3:29 PM  [name]   [id number]   Type of Contact and Topic:  PSA Attempt #1   CSW attempted to complete PSA with patient in conjunction with physician. Patient became irritable, disorganized, and circumstantial. By the end of the physician's portion of assessment, patient was visibly distraught. CSW determined patient's emotional states was not suitable for further assessment. CSW will make additional attempts to collect remaining information at a later time.     Signed:  Durenda Hurt, MSW, LCSWA, LCAS 08/30/2022 3:29 PM
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
## 2022-08-30 NOTE — Progress Notes (Signed)
Adult Psychoeducational Group Note  Date:  08/30/2022 Time:  9:13 PM  Group Topic/Focus:  Wrap-Up Group:   The focus of this group is to help patients review their daily goal of treatment and discuss progress on daily workbooks.  Participation Level:  Did Not Attend  Participation Quality:   n/a  Affect:   n/a  Cognitive:   n/a  Insight: None  Engagement in Group:   n/a  Modes of Intervention:   n/a  Additional Comments:   Pt did not attend the Wrap Up group.  [name] 08/30/2022, 9:13 PM

## 2022-08-30 NOTE — Tx Team (Signed)
Initial Treatment Plan 08/30/2022 3:16 AM [name] [id number]    PATIENT STRESSORS: Financial difficulties   Health problems   Marital or family conflict   Medication change or noncompliance   Occupational concerns     PATIENT STRENGTHS: Motivation for treatment/growth  Supportive family/friends    PATIENT IDENTIFIED PROBLEMS: Anxiety  Depression  Financial issues                 DISCHARGE CRITERIA:  Ability to meet basic life and health needs Improved stabilization in mood, thinking, and/or behavior Verbal commitment to aftercare and medication compliance  PRELIMINARY DISCHARGE PLAN: Return to previous living arrangement  PATIENT/FAMILY INVOLVEMENT: This treatment plan has been presented to and reviewed with the patient, [name].  The patient has been given the opportunity to ask questions and make suggestions.  Azucena Cecil, RN 08/30/2022, 3:16 AM

## 2022-08-30 NOTE — Progress Notes (Addendum)
Pt reassigned high fall risk. Pt given yellow socks and yellow armband. Pt keeps taking yellow socks off. Pt re-educated in fall precautions but keeps arguing with the nurse that nothing is wrong with her. Pt asked orientation questions and she is oriented to self and sometimes place. Pt stated it was 2024 and she doesn't know why she is here.    08/30/22 0600  Precautions / Armbands  Precautions Fall risk;Other (Comment) (Safety q15 minute checks)  Patient armbands applied: Patient Identification (White);Fall risk (Yellow)  [facility] Fall Risk Assessment  Age 51  Mental Status 2  Physical Satus 0  Elimination 0  Sensory Impairments 0  Gait or Balance 2  History or falls in past 6 months 2  Mood Stabilizer Medications 0  Benzodiazepines 1  Narcotics 0  Sedatives/Hypnotics 0  Atypical Anti Psychotics 0  Detox Protocol (Alcohol, Narcotics, etc.) 0  Total Score 7  Patient Fall Risk Level High fall risk  Required Bundle Interventions *See Row Information* High fall risk - low and high requirements implemented  Screening for Fall Injury Risk (To be completed on HIGH fall risk patients) - Assessing Need for Floor Mats  Risk For Fall Injury- Criteria for Floor Mats None identified - No additional interventions needed  Safety Interventions  Less Restrictive Interventions Active listening;Observation;Pain/Anxiety management;Provide reassurance

## 2022-08-31 ENCOUNTER — Inpatient Hospital Stay (HOSPITAL_COMMUNITY): Payer: Medicare HMO

## 2022-08-31 DIAGNOSIS — F333 Major depressive disorder, recurrent, severe with psychotic symptoms: Secondary | ICD-10-CM

## 2022-08-31 LAB — COMPREHENSIVE METABOLIC PANEL
ALT: 14 U/L (ref 0–44)
ALT: 15 U/L (ref 0–44)
AST: 18 U/L (ref 15–41)
AST: 17 U/L (ref 15–41)
Albumin: 4.1 g/dL (ref 3.5–5.0)
Albumin: 3.9 g/dL (ref 3.5–5.0)
Alkaline Phosphatase: 64 U/L (ref 38–126)
Alkaline Phosphatase: 58 U/L (ref 38–126)
Anion gap: 4 — ABNORMAL LOW (ref 5–15)
Anion gap: 4 — ABNORMAL LOW (ref 5–15)
BUN: 17 mg/dL (ref 6–20)
BUN: 15 mg/dL (ref 6–20)
CO2: 29 mmol/L (ref 22–32)
CO2: 26 mmol/L (ref 22–32)
Calcium: 9.6 mg/dL (ref 8.9–10.3)
Calcium: 9.2 mg/dL (ref 8.9–10.3)
Chloride: 105 mmol/L (ref 98–111)
Chloride: 108 mmol/L (ref 98–111)
Creatinine, Ser: 0.75 mg/dL (ref 0.44–1.00)
Creatinine, Ser: 0.54 mg/dL (ref 0.44–1.00)
GFR, Estimated: >60 mL/min (ref >60)
GFR, Estimated: >60 mL/min (ref >60)
Glucose, Bld: 100 mg/dL — ABNORMAL HIGH (ref 70–99)
Glucose, Bld: 93 mg/dL (ref 70–99)
Potassium: 3.7 mmol/L (ref 3.5–5.1)
Potassium: 3.5 mmol/L (ref 3.5–5.1)
Sodium: 138 mmol/L (ref 135–145)
Sodium: 138 mmol/L (ref 135–145)
Total Bilirubin: 0.7 mg/dL (ref 0.3–1.2)
Total Bilirubin: 0.9 mg/dL (ref 0.3–1.2)
Total Protein: 7 g/dL (ref 6.5–8.1)
Total Protein: 6.5 g/dL (ref 6.5–8.1)

## 2022-08-31 LAB — URINALYSIS, ROUTINE W REFLEX MICROSCOPIC
Bilirubin Urine: NEGATIVE
Glucose, UA: NEGATIVE mg/dL
Hgb urine dipstick: NEGATIVE
Ketones, ur: NEGATIVE mg/dL
Leukocytes,Ua: NEGATIVE
Nitrite: NEGATIVE
Protein, ur: NEGATIVE mg/dL
Specific Gravity, Urine: 1.011 (ref 1.005–1.030)
pH: 7 (ref 5.0–8.0)

## 2022-08-31 LAB — CBC
HCT: 41.6 % (ref 36.0–46.0)
HCT: 39.3 % (ref 36.0–46.0)
Hemoglobin: 14.3 g/dL (ref 12.0–15.0)
Hemoglobin: 13.3 g/dL (ref 12.0–15.0)
MCH: 32.3 pg (ref 26.0–34.0)
MCH: 32 pg (ref 26.0–34.0)
MCHC: 34.4 g/dL (ref 30.0–36.0)
MCHC: 33.8 g/dL (ref 30.0–36.0)
MCV: 93.9 fL (ref 80.0–100.0)
MCV: 94.7 fL (ref 80.0–100.0)
Platelets: 277 10*3/uL (ref 150–400)
Platelets: 264 10*3/uL (ref 150–400)
RBC: 4.43 MIL/uL (ref 3.87–5.11)
RBC: 4.15 MIL/uL (ref 3.87–5.11)
RDW: 12.9 % (ref 11.5–15.5)
RDW: 12.9 % (ref 11.5–15.5)
WBC: 6.8 10*3/uL (ref 4.0–10.5)
WBC: 6.4 10*3/uL (ref 4.0–10.5)
nRBC: 0 % (ref 0.0–0.2)
nRBC: 0 % (ref 0.0–0.2)

## 2022-08-31 LAB — FOLATE: Folate: 6.7 ng/mL (ref >5.9)

## 2022-08-31 LAB — VITAMIN D 25 HYDROXY (VIT D DEFICIENCY, FRACTURES): Vit D, 25-Hydroxy: 30.17 ng/mL (ref 30–100)

## 2022-08-31 LAB — PREGNANCY, URINE: Preg Test, Ur: NEGATIVE

## 2022-08-31 LAB — AMMONIA
Ammonia: 14 umol/L (ref 9–35)
Ammonia: <10 umol/L (ref 9–35)

## 2022-08-31 LAB — VITAMIN B12: Vitamin B-12: 202 pg/mL (ref 180–914)

## 2022-08-31 LAB — HIV ANTIBODY (ROUTINE TESTING W REFLEX): HIV Screen 4th Generation wRfx: NONREACTIVE

## 2022-08-31 LAB — TSH: TSH: 1.564 u[IU]/mL (ref 0.350–4.500)

## 2022-08-31 MED ORDER — OLANZAPINE 5 MG PO TBDP
5.0000 mg | ORAL_TABLET | Freq: Every day | ORAL | Status: DC
  Administered 2022-08-31: 5 mg via ORAL
  Filled 2022-08-31 (×2): qty 1

## 2022-08-31 MED ORDER — ZIPRASIDONE MESYLATE 20 MG IM SOLR: 20.0000 mg | INTRAMUSCULAR | Status: DC | PRN

## 2022-08-31 MED ORDER — LORAZEPAM 1 MG PO TABS: 1.0000 mg | ORAL_TABLET | ORAL | Status: DC | PRN

## 2022-08-31 MED ORDER — LORAZEPAM 2 MG/ML IJ SOLN: 2.0000 mg | Freq: Once | INTRAMUSCULAR | Status: DC

## 2022-08-31 MED ORDER — LORAZEPAM 1 MG PO TABS: 2.0000 mg | ORAL_TABLET | Freq: Once | ORAL | Status: DC

## 2022-08-31 MED ORDER — LORAZEPAM 1 MG PO TABS
1.0000 mg | ORAL_TABLET | Freq: Three times a day (TID) | ORAL | Status: DC
  Administered 2022-08-31: 1 mg via ORAL
  Filled 2022-08-31: qty 1

## 2022-08-31 MED ORDER — BENZTROPINE MESYLATE 0.5 MG PO TABS: 0.5000 mg | ORAL_TABLET | Freq: Two times a day (BID) | ORAL | Status: DC | PRN

## 2022-08-31 MED ORDER — LORAZEPAM 2 MG/ML IJ SOLN
INTRAMUSCULAR | Status: AC
  Administered 2022-08-31: 2 mg
  Filled 2022-08-31: qty 1

## 2022-08-31 MED ORDER — OLANZAPINE 5 MG PO TBDP: 5.0000 mg | ORAL_TABLET | Freq: Three times a day (TID) | ORAL | Status: DC | PRN
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
## 2022-08-31 NOTE — Plan of Care (Signed)
Spoke to Dr. Aletta Edouard, MD at [facility] Emergency department regarding patient for concerns of altered mental status - patient not eating, drinking, or taking medications. Minimally responsive to painful stimuli. This is a significant change from yesterday when patient was severely agitated and confused but eating, drinking, and mobilizing.  Patient was trialed on 2 mg lorazepam this AM with negligible improvement.  Given patient's acute change, poor PO intake, suspected seizure activity on chart review prior to [facility] transfer in the setting of anorexia nervosa (BMI 17.85), and limited resource capabilities at [facility], will transfer to [facility] ED for further workup / stabilization.

## 2022-08-31 NOTE — Progress Notes (Signed)
   08/31/22 0530  Sleep  Number of Hours 7.5

## 2022-08-31 NOTE — Progress Notes (Signed)
Adult Psychoeducational Group Note  Date:  08/31/2022 Time:  9:24 PM  Group Topic/Focus:  Wrap-Up Group:   The focus of this group is to help patients review their daily goal of treatment and discuss progress on daily workbooks.  Participation Level:  None  Participation Quality:   n/a  Affect:  Flat  Cognitive:  Confused and Lacking  Insight: None  Engagement in Group:  None  Modes of Intervention:  Discussion  Additional Comments:   Pt attended the Wrap Up group. Pt appeared confused and disoriented. Pt did not participated in the [place] group. When prompted by MHT, Pt  speech was slurred , mumbling tone along with nonverbal pointing  gestures and grunting sounds. Pt mumbled that " pain." RN entered day room and attended to Pt. Pt uncooperative and refused treatment offered by RN.  Wetzel Bjornstad Ajdin Macke 08/31/2022, 9:24 PM

## 2022-08-31 NOTE — ED Triage Notes (Signed)
Pt BIBA from [facility] admission for 'catatonia' and poor PO intake. Pt has been refusing food and meds. Pt was walking around halls when EMS arrived, compliant in triage. VSS CBG 128. [facility] sitter at bedside

## 2022-08-31 NOTE — ED Notes (Signed)
Called [facility] and spoke with Benjamine Mola. Report given and will set up transport for this patients return.

## 2022-08-31 NOTE — Progress Notes (Signed)
Patient scheduled to be transported to [facility] ER vis EMS.

## 2022-08-31 NOTE — ED Provider Notes (Signed)
  Physical Exam  BP 124/77   Pulse (!) 121   Temp 98.4 F (36.9 C) (Oral)   Resp (!) 28   Ht '5\' 4"'$  (1.626 m)   Wt 47.2 kg   SpO2 100%   BMI 17.85 kg/m   Physical Exam  Procedures  Procedures  ED Course / MDM   Clinical Course as of 08/31/22 1801  Fri Aug 31, 2022  1648 Upon reevaluation, patient is resting comfortably in bed. She is alert and responsive, but has difficulty speaking.  [AS]    Clinical Course User Index [AS] Sostenes Kauffmann, [place] Folk, PA-C   Medical Decision Making Amount and/or Complexity of Data Reviewed Labs: ordered. Radiology: ordered.  Care was transferred to me from [place], [place]. In short, patient is a 51 year old female with a history of panic disorder, ADHD, anxiety, depression.  She was being seen and evaluated by behavioral health due to significant change in her baseline patient was reportedly catatonic at [facility].  Per report, patient was alert and oriented on initial evaluation.  But reports generalized and slurred speech.  Reportedly answers "sometimes "to all questions.   Patient is currently undergoing medical clearance with plan to return to [facility] if all results are normal.  CT head normal. 1800: labs all within normal limits. CT head normal. Patient is alert and responsive on reevaluation. She has a depressed affect and does not want to talk, however does respond to some questions. Was eating food on reevaluation. Has some stuttering, but this has been present in the past per chart review. Initial evaluation showed patient ambulatory and conversing. Sitter has been present throughout her stay and has seen no seizure like activity. Symptoms likely secondary to psychiatric history. Stable for transfer back to [facility].         [name], Hershal Coria 08/31/22 1816    Isla Pence, MD 09/01/22 1429

## 2022-08-31 NOTE — Group Note (Addendum)
LCSW Group Therapy Note   Group Date: 08/31/2022 Start Time: 1100 End Time: 1200  Type of Therapy/Topic:  Group Therapy:  Balance in Life  Participation Level:  Active  Description of Group:    This group will address the concept of balance and how it feels and looks when one is unbalanced. Patients will be encouraged to process areas in their lives that are out of balance and identify reasons for remaining unbalanced. Facilitators will guide patients in utilizing problem-solving interventions to address and correct the stressor making their life unbalanced. Understanding and applying boundaries will be explored and addressed for obtaining and maintaining a balanced life. Patients will be encouraged to explore ways to assertively make their unbalanced needs known to significant others in their lives, using other group members and facilitator for support and feedback.  Therapeutic Goals: Patient will identify two or more emotions or situations they have that consume much of in their lives. Patient will identify two ways to set boundaries in order to achieve balance in their lives:   Summary of Patient Progress:  Due to the acuity and complex discharge plans, group was not held. Patient was provided therapeutic worksheets and asked to meet with CSW as needed.   Therapeutic Modalities:   Cognitive Behavioral Therapy Solution-Focused Therapy Assertiveness Training  Darleen Crocker, [place] 08/31/2022  11:43 AM

## 2022-08-31 NOTE — Progress Notes (Signed)
  Pt visible in the dayroom some this evening after coming from the [place] took HS medications without incident, pt incoherent at times .     08/31/22 2100  Psych Admission Type (Psych Patients Only)  Admission Status Voluntary  Psychosocial Assessment  Patient Complaints Suspiciousness  Eye Contact Brief  Facial Expression Anxious  Affect Angry;Anxious  Speech Argumentative  Interaction Defensive  Motor Activity Slow  Appearance/Hygiene Disheveled  Behavior Characteristics Anxious  Mood Anxious;Preoccupied  Aggressive Behavior  Effect No apparent injury  Thought Process  Coherency Disorganized;Flight of ideas;Incoherent  Content Confabulation;Delusions;Blaming others  Delusions Paranoid  Perception WDL  Hallucination None reported or observed  Judgment Poor  Confusion Moderate  Danger to Self  Current suicidal ideation? Denies  Danger to Others  Danger to Others None reported or observed

## 2022-08-31 NOTE — ED Provider Notes (Signed)
[facility] DEPT Provider Note   CSN: [id number] Arrival date & time: 08/31/22  1243     History  Chief Complaint  Patient presents with   MDD    [name] is a 51 y.o. female with a past medical history significant for previous intraspinal abscess, cavitary lung lesion, staph infection, tobacco abuse, cocaine use, panic disorder, ADHD, anxiety, depression who is currently being seen and evaluated by behavioral health secondary to anxiety, depression with psychosis, anxiety disorder and cannabis use disorder who is brought downstairs from [facility] due to significant change in her baseline per their notes.  Patient was quite mobile, agitated yesterday, with report that patient was poorly arousable to sternal rub, and "nearly catatonic" as well as declining any p.o. food or meds.  On arrival patient was walking around halls, compliant in triage, on my evaluation she reports some generalized pain, feels like she is having difficulty with speech stating that she is having some stuttering speech, when evaluating her review of symptoms she reports "sometimes" to all questions, and asking for clarification reports that "yes I've had the symptoms before, what are you asking?"  HPI     Home Medications Prior to Admission medications   Medication Sig Start Date End Date Taking? Authorizing Provider  ALPRAZolam Duanne Moron) 1 MG tablet Take 1 tablet (1 mg total) by mouth 2 (two) times daily as needed. for anxiety 06/14/22   Salley Slaughter, NP      Allergies    Penicillins    Review of Systems   Review of Systems  All other systems reviewed and are negative.   Physical Exam Updated Vital Signs BP 124/77   Pulse (!) 121   Temp 98.4 F (36.9 C) (Oral)   Resp (!) 28   Ht '5\' 4"'$  (1.626 m)   Wt 47.2 kg   SpO2 100%   BMI 17.85 kg/m  Physical Exam Vitals and nursing note reviewed.  Constitutional:      General: She is not in acute distress.    Appearance:  Normal appearance.  HENT:     Head: Normocephalic and atraumatic.  Eyes:     General:        Right eye: No discharge.        Left eye: No discharge.  Cardiovascular:     Rate and Rhythm: Normal rate and regular rhythm.  Pulmonary:     Effort: Pulmonary effort is normal. No respiratory distress.  Musculoskeletal:        General: No deformity.  Skin:    General: Skin is warm and dry.  Neurological:     Mental Status: She is alert and oriented to person, place, and time.     Comments: Patient moves all 4 limbs spontaneously, intact and 5/5 in bilateral upper and lower extremities.  She can follow some commands, she is alert and oriented to self, location.  She does have some stuttering speech but has lapses where she has completely normal speech  Psychiatric:     Comments: Patient with intermittent anger, agitation, confusion, she does have some stuttering speech.     ED Results / Procedures / Treatments   Labs (all labs ordered are listed, but only abnormal results are displayed) Labs Reviewed  URINE CULTURE  CBC  AMMONIA  PREGNANCY, URINE  COMPREHENSIVE METABOLIC PANEL  FOLATE  TSH  VITAMIN A  VITAMIN B1  VITAMIN B12  VITAMIN E  URINALYSIS, ROUTINE W REFLEX MICROSCOPIC  CBC  COMPREHENSIVE METABOLIC  PANEL  AMMONIA  HIV ANTIBODY (ROUTINE TESTING W REFLEX)  RPR  VITAMIN D 25 HYDROXY (VIT D DEFICIENCY, FRACTURES)  VITAMIN C    EKG EKG Interpretation  Date/Time:  Friday August 31 2022 14:03:17 EDT Ventricular Rate:  71 PR Interval:  120 QRS Duration: 72 QT Interval:  420 QTC Calculation: 457 R Axis:   81 Text Interpretation: Sinus rhythm Biatrial enlargement No significant change since prior 8/23 Confirmed by Aletta Edouard [phone]) on 08/31/2022 2:09:14 PM  Radiology CT Head Wo Contrast  Result Date: 08/31/2022 CLINICAL DATA:  Mental status change, unknown cause. EXAM: CT HEAD WITHOUT CONTRAST TECHNIQUE: Contiguous axial images were obtained from the base  of the skull through the vertex without intravenous contrast. RADIATION DOSE REDUCTION: This exam was performed according to the departmental dose-optimization program which includes automated exposure control, adjustment of the mA and/or kV according to patient size and/or use of iterative reconstruction technique. COMPARISON:  Head CT 08/28/2022 FINDINGS: Brain: There is no evidence of an acute infarct, intracranial hemorrhage, mass, midline shift, or extra-axial fluid collection. The ventricles and sulci are normal. Vascular: No hyperdense vessel. Skull: No fracture or suspicious osseous lesion. Sinuses/Orbits: Visualized paranasal sinuses and mastoid air cells are clear. Unremarkable orbits. Other: None. IMPRESSION: Negative head CT. Electronically Signed   By: Logan Bores M.D.   On: 08/31/2022 14:00    Procedures Procedures    Medications Ordered in ED Medications  acetaminophen (TYLENOL) tablet 650 mg (has no administration in time range)  alum & mag hydroxide-simeth (MAALOX/MYLANTA) 200-200-20 MG/5ML suspension 30 mL (has no administration in time range)  magnesium hydroxide (MILK OF MAGNESIA) suspension 30 mL (has no administration in time range)  feeding supplement (ENSURE ENLIVE / ENSURE PLUS) liquid 237 mL (237 mLs Oral Not Given 08/31/22 0923)  thiamine (VITAMIN B1) tablet 100 mg (100 mg Oral Not Given 08/31/22 1111)    Followed by  thiamine (VITAMIN B1) tablet 100 mg (has no administration in time range)  multivitamin with minerals tablet 1 tablet (1 tablet Oral Not Given 08/31/22 0923)  LORazepam (ATIVAN) tablet 1 mg (1 mg Oral Given 08/30/22 2006)  hydrOXYzine (ATARAX) tablet 25 mg (has no administration in time range)  loperamide (IMODIUM) capsule 2-4 mg (has no administration in time range)  ondansetron (ZOFRAN-ODT) disintegrating tablet 4 mg (has no administration in time range)  traZODone (DESYREL) tablet 50 mg (has no administration in time range)  gabapentin (NEURONTIN) capsule  400 mg (400 mg Oral Not Given 08/31/22 1112)  nicotine (NICODERM CQ - dosed in mg/24 hours) patch 14 mg (has no administration in time range)  LORazepam (ATIVAN) tablet 2 mg ( Oral See Alternative 08/31/22 1103)    Or  LORazepam (ATIVAN) injection 2 mg (2 mg Intramuscular Not Given 08/31/22 1103)  OLANZapine zydis (ZYPREXA) disintegrating tablet 5 mg (has no administration in time range)  benztropine (COGENTIN) tablet 0.5 mg (has no administration in time range)  OLANZapine zydis (ZYPREXA) disintegrating tablet 5 mg (has no administration in time range)    And  LORazepam (ATIVAN) tablet 1 mg (has no administration in time range)    And  ziprasidone (GEODON) injection 20 mg (has no administration in time range)  LORazepam (ATIVAN) 2 MG/ML injection (2 mg  Given 08/31/22 1119)    ED Course/ Medical Decision Making/ A&P                           Medical Decision Making  This is  an overall well-appearing 51 year old female who presents from the inpatient behavioral health due to concern for altered mental status.  Per behavioral health provider she was poorly arousable even to sternal rub, nearly catatonic this morning.  On arrival to the ED she is ambulatory, moves all 4 limbs spontaneously, she does have an odd affect, and some decreased speech from expected, however she has previously documented history of similar.  Her answers to questions are appropriate for which questions she will answer.  She reports that she has some generalized pain today but denies significant or focal symptoms.  She denies fever, chills, chest pain, shortness of breath.  On arrival patient had been administered Ativan secondary to concern for possible seizure.  She has had history of seizure at least once prior.  She had also received some antipsychotic medication Zyprexa prior to arrival in the emergency department.  I note no focal neurologic deficits in this patient, but does have an overall blunted or flat affect,  however as she can answer questions appropriately and otherwise seems similar to previous evaluations, difficult to assess whether this represents an acute change in her mental status.  We will obtain screening lab work, CT head without contrast to further evaluate.  Independently interpreted imaging including CT head without contrast which shows no evidence of new intracranial abnormality or other finding to explain patient's symptoms.  I independently interpreted the EKG which shows normal sinus rhythm with no significant change from baseline.  I independently interpreted lab work including CBC, ammonia, which shows no significant abnormalities.  Patient continues to remain at baseline noted on her initial evaluation today.  3:07 PM Care of Jazmaine R Farrington transferred to [facility] and Dr. Gilford Raid at the end of my shift as the patient will require reassessment once labs/imaging have resulted. Patient presentation, ED course, and plan of care discussed with review of all pertinent labs and imaging. Please see his/her note for further details regarding further ED course and disposition. Plan at time of handoff is return to [facility] pending medical workup. This may be altered or completely changed at the discretion of the oncoming team pending results of further workup.  Final Clinical Impression(s) / ED Diagnoses Final diagnoses:  None    Rx / DC Orders ED Discharge Orders     None         Dorien Chihuahua 08/31/22 1507    Hayden Rasmussen, MD 08/31/22 1905

## 2022-08-31 NOTE — Progress Notes (Signed)
Patient refused all morning scheduled medications. Patient was given urine cup for lab collection. Staff will continue to encourage compliance with treatment.

## 2022-08-31 NOTE — ED Triage Notes (Signed)
Received '2mg'$  ativan within 87mof arrival

## 2022-09-01 ENCOUNTER — Inpatient Hospital Stay (HOSPITAL_COMMUNITY)
Admission: EM | Admit: 2022-09-01 | Discharge: 2022-09-05 | DRG: 092 | Disposition: A | Discharge status: Other Acute Inpatient Hospital | Payer: Medicare HMO | Attending: Family Medicine | Admitting: Family Medicine

## 2022-09-01 ENCOUNTER — Observation Stay (HOSPITAL_COMMUNITY): Payer: Medicare HMO

## 2022-09-01 ENCOUNTER — Other Ambulatory Visit: Payer: Self-pay

## 2022-09-01 ENCOUNTER — Encounter (HOSPITAL_COMMUNITY): Payer: Self-pay | Admitting: Emergency Medicine

## 2022-09-01 DIAGNOSIS — E349 Endocrine disorder, unspecified: Secondary | ICD-10-CM

## 2022-09-01 DIAGNOSIS — E46 Unspecified protein-calorie malnutrition: Secondary | ICD-10-CM

## 2022-09-01 DIAGNOSIS — F1990 Other psychoactive substance use, unspecified, uncomplicated: Secondary | ICD-10-CM

## 2022-09-01 DIAGNOSIS — Z90710 Acquired absence of both cervix and uterus: Secondary | ICD-10-CM

## 2022-09-01 DIAGNOSIS — F319 Bipolar disorder, unspecified: Secondary | ICD-10-CM

## 2022-09-01 DIAGNOSIS — Z79899 Other long term (current) drug therapy: Secondary | ICD-10-CM

## 2022-09-01 DIAGNOSIS — F333 Major depressive disorder, recurrent, severe with psychotic symptoms: Secondary | ICD-10-CM | POA: Diagnosis not present

## 2022-09-01 DIAGNOSIS — F1210 Cannabis abuse, uncomplicated: Secondary | ICD-10-CM | POA: Diagnosis present

## 2022-09-01 DIAGNOSIS — R4182 Altered mental status, unspecified: Secondary | ICD-10-CM | POA: Diagnosis present

## 2022-09-01 DIAGNOSIS — R4701 Aphasia: Secondary | ICD-10-CM | POA: Diagnosis not present

## 2022-09-01 DIAGNOSIS — R0602 Shortness of breath: Secondary | ICD-10-CM | POA: Diagnosis not present

## 2022-09-01 DIAGNOSIS — Z87891 Personal history of nicotine dependence: Secondary | ICD-10-CM

## 2022-09-01 DIAGNOSIS — Z880 Allergy status to penicillin: Secondary | ICD-10-CM

## 2022-09-01 DIAGNOSIS — F1410 Cocaine abuse, uncomplicated: Secondary | ICD-10-CM | POA: Diagnosis present

## 2022-09-01 DIAGNOSIS — R112 Nausea with vomiting, unspecified: Secondary | ICD-10-CM | POA: Diagnosis not present

## 2022-09-01 DIAGNOSIS — Z20822 Contact with and (suspected) exposure to covid-19: Secondary | ICD-10-CM | POA: Diagnosis present

## 2022-09-01 DIAGNOSIS — F909 Attention-deficit hyperactivity disorder, unspecified type: Secondary | ICD-10-CM | POA: Diagnosis present

## 2022-09-01 DIAGNOSIS — E440 Moderate protein-calorie malnutrition: Secondary | ICD-10-CM | POA: Diagnosis present

## 2022-09-01 DIAGNOSIS — R7989 Other specified abnormal findings of blood chemistry: Secondary | ICD-10-CM

## 2022-09-01 DIAGNOSIS — F419 Anxiety disorder, unspecified: Secondary | ICD-10-CM | POA: Insufficient documentation

## 2022-09-01 DIAGNOSIS — Z780 Asymptomatic menopausal state: Secondary | ICD-10-CM

## 2022-09-01 DIAGNOSIS — Z681 Body mass index (BMI) 19 or less, adult: Secondary | ICD-10-CM

## 2022-09-01 DIAGNOSIS — F1310 Sedative, hypnotic or anxiolytic abuse, uncomplicated: Secondary | ICD-10-CM | POA: Diagnosis present

## 2022-09-01 DIAGNOSIS — K5900 Constipation, unspecified: Secondary | ICD-10-CM

## 2022-09-01 DIAGNOSIS — Z8673 Personal history of transient ischemic attack (TIA), and cerebral infarction without residual deficits: Secondary | ICD-10-CM

## 2022-09-01 DIAGNOSIS — F3164 Bipolar disorder, current episode mixed, severe, with psychotic features: Secondary | ICD-10-CM

## 2022-09-01 LAB — RPR: RPR Ser Ql: NONREACTIVE

## 2022-09-01 LAB — BASIC METABOLIC PANEL
Anion gap: 1 — ABNORMAL LOW (ref 5–15)
BUN: 13 mg/dL (ref 6–20)
CO2: 27 mmol/L (ref 22–32)
Calcium: 8.9 mg/dL (ref 8.9–10.3)
Chloride: 108 mmol/L (ref 98–111)
Creatinine, Ser: 0.63 mg/dL (ref 0.44–1.00)
GFR, Estimated: >60 mL/min (ref >60)
Glucose, Bld: 84 mg/dL (ref 70–99)
Potassium: 4.4 mmol/L (ref 3.5–5.1)
Sodium: 136 mmol/L (ref 135–145)

## 2022-09-01 LAB — CBC
HCT: 41.7 % (ref 36.0–46.0)
Hemoglobin: 14.1 g/dL (ref 12.0–15.0)
MCH: 31.6 pg (ref 26.0–34.0)
MCHC: 33.8 g/dL (ref 30.0–36.0)
MCV: 93.5 fL (ref 80.0–100.0)
Platelets: 288 10*3/uL (ref 150–400)
RBC: 4.46 MIL/uL (ref 3.87–5.11)
RDW: 13.1 % (ref 11.5–15.5)
WBC: 7 10*3/uL (ref 4.0–10.5)
nRBC: 0 % (ref 0.0–0.2)

## 2022-09-01 LAB — URINE CULTURE: Culture: 20000 — AB

## 2022-09-01 MED ORDER — ENOXAPARIN SODIUM 30 MG/0.3ML IJ SOSY: 30.0000 mg | PREFILLED_SYRINGE | INTRAMUSCULAR | Status: DC

## 2022-09-01 MED ORDER — ENOXAPARIN SODIUM 30 MG/0.3ML IJ SOSY
30.0000 mg | PREFILLED_SYRINGE | INTRAMUSCULAR | Status: DC
  Administered 2022-09-02 – 2022-09-04 (×3): 30 mg via SUBCUTANEOUS
  Filled 2022-09-01 (×4): qty 0.3

## 2022-09-01 MED ORDER — SODIUM CHLORIDE 0.9 % IV SOLN: INTRAVENOUS | Status: DC

## 2022-09-01 NOTE — Assessment & Plan Note (Addendum)
BMI 18. Refusing to eat.  - supplemental nutrition shakes  - start IV maintenance fluids

## 2022-09-01 NOTE — Consult Note (Signed)
Neurology Consultation  Reason for Consult: aphasia and behavioral changes Referring Physician: Dr. Andria Frames  CC: none  History is obtained from: chart  HPI: [name] is a 51 y.o. female with history of bipolar disorder, ADD and anxiety who comes to the ED from [facility] with difficulty speaking that began this morning.  Patient was unable to express herself verbally and demonstrated frustration with this.  Of note, patient has been able to communicate by writing complete sentences but would not do so for this examiner.   ROS:  Unable to obtain due to altered mental status.   Past Medical History:  Diagnosis Date   ADHD (attention deficit hyperactivity disorder)    Anxiety    Depression    Hx of staphylococcal septicemia      History reviewed. No pertinent family history.   Social History:   reports that she has quit smoking. Her smoking use included cigarettes. She has a 20.00 pack-year smoking history. She has never used smokeless tobacco. She reports that she does not currently use alcohol. She reports that she does not currently use drugs after having used the following drugs: Marijuana.  Medications  Current Facility-Administered Medications:    0.9 %  sodium chloride infusion, , Intravenous, Continuous, Maxwell, Allee, MD   [START ON 09/02/2022] enoxaparin (LOVENOX) injection 30 mg, 30 mg, Subcutaneous, Q24H, Maxwell, Allee, MD No current outpatient medications on file.   Exam: Current vital signs: BP (!) 121/95   Pulse 91   Temp 98 F (36.7 C) (Axillary)   Resp 20   SpO2 98%  Vital signs in last 24 hours: Temp:  [98 F (36.7 C)] 98 F (36.7 C) (09/16 1047) Pulse Rate:  [71-91] 91 (09/16 1515) Resp:  [16-20] 20 (09/16 1515) BP: (109-121)/(78-95) 121/95 (09/16 1515) SpO2:  [96 %-98 %] 98 % (09/16 1515)  GENERAL: Drowsy in no acute distress Psych: Flat affect and limited cooperation with exam Head: Normocephalic and atraumatic, without obvious  abnormality EENT: Normal conjunctivae, moist mucous membranes, no OP obstruction LUNGS: Normal respiratory effort. Non-labored breathing on room air Extremities: warm, well perfused, without obvious deformity  NEURO:  PERRL, tracks examiner around room.  Occasionally has mumbling speech but no intelligible words.  Will intermittently follow simple but not complex commands.  Moves all extremities with antigravity strength.  Will nod and shake head in response to questions.  Has been communicating with staff by writing full sentences in legible handwriting but would not do so on exam.  Labs I have reviewed labs in epic and the results pertinent to this consultation are:   CBC    Component Value Date/Time   WBC 7.0 09/01/2022 1131   RBC 4.46 09/01/2022 1131   HGB 14.1 09/01/2022 1131   HCT 41.7 09/01/2022 1131   PLT 288 09/01/2022 1131   MCV 93.5 09/01/2022 1131   MCH 31.6 09/01/2022 1131   MCHC 33.8 09/01/2022 1131   RDW 13.1 09/01/2022 1131   LYMPHSABS 2.0 08/28/2022 1414   MONOABS 0.7 08/28/2022 1414   EOSABS 0.0 08/28/2022 1414   BASOSABS 0.0 08/28/2022 1414    CMP     Component Value Date/Time   NA 136 09/01/2022 1131   K 4.4 09/01/2022 1131   CL 108 09/01/2022 1131   CO2 27 09/01/2022 1131   GLUCOSE 84 09/01/2022 1131   BUN 13 09/01/2022 1131   CREATININE 0.63 09/01/2022 1131   CALCIUM 8.9 09/01/2022 1131   PROT 6.5 08/31/2022 1630   ALBUMIN 3.9 08/31/2022  1630   AST 17 08/31/2022 1630   ALT 15 08/31/2022 1630   ALKPHOS 58 08/31/2022 1630   BILITOT 0.9 08/31/2022 1630   GFRNONAA >60 09/01/2022 1131   GFRAA >60 06/17/2019 0432    Lipid Panel     Component Value Date/Time   CHOL 236 (H) 08/08/2022 1548   TRIG 69 08/08/2022 1548   HDL 89 08/08/2022 1548   CHOLHDL 2.7 08/08/2022 1548   VLDL 14 08/08/2022 1548   LDLCALC 133 (H) 08/08/2022 1548     Imaging I have reviewed the images obtained:  CT-scan of the brain: No acute abnormality  MRI examination  of the brain pending  LTM EEG pending  Assessment: 51 year old patient with history of bipolar disorder, ADD and anxiety presents from [facility] with inability to speak that began this morning.  Patient is able to communicate by writing, can nod and shake her head but is only able to create mumbling speech.  As patient is able to use written communication, this is more likely a functional speech impairment than a stroke, or other organic issue.  Will obtain brian MRI and LTM EEG to investigate for seizure or other cause of speech abnormality.  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 Seizure vs. stroke  Recommendations: - MRI brain - LTM EEG - Appreciate psychiatry recommendations  Pt seen by NP/Neuro and later by MD. Note/plan to be edited by MD as needed.  [place] , MSN, AGACNP-BC [facility] See Amion for schedule and pager information 09/01/2022 5:36 PM  NEUROHOSPITALIST ADDENDUM Performed a face to face diagnostic evaluation.   I have reviewed the contents of history and physical exam as documented by PA/ARNP/Resident and agree with above documentation.  I have discussed and formulated the above plan as documented. Edits to the note have been made as needed.  Impression/Key exam findings/Plan: 27F p/w difficulty talking. Could be aphemia/speech apraxia or aphasia. She is moving all extremities spontaneously for me but seems overwhelmed and crying. Per RN, started after she got up to use the bathroom. She was calm prior to that. Suspect this is Psych but would like to rule out organic etiology to her presentation. Agree with MRI Brain and a cEEG. Can disconnect EEG in AM if negative for seizures.  No further workup if MRI and EEG are negative.  Donnetta Simpers, MD [facility] [phone]   If 7pm to 7am, please call on call as listed on AMION.

## 2022-09-01 NOTE — Progress Notes (Signed)
   09/01/22 0545  Sleep  Number of Hours 8

## 2022-09-01 NOTE — ED Notes (Signed)
Received copy of IVC paperwork, however did not received original papers. Made copies for nurse and placed copy received in purple.
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
## 2022-09-01 NOTE — ED Notes (Signed)
EEG at bedside.

## 2022-09-01 NOTE — Progress Notes (Signed)
Patient ID: [name], female   DOB: 1971-07-03, 51 y.o.   MRN: [id number]   EMS on unit to transfer pt to ED, per MD. MD in room with EMS and pt.

## 2022-09-01 NOTE — Assessment & Plan Note (Addendum)
Patient had elevated Hcg 3 months ago. Likely perimenopausal with questionable relationship to antipsychotics. CTAP with no intraabdominal pathology or mass and moderate colonic stool burden. -no further w/u needed

## 2022-09-01 NOTE — Assessment & Plan Note (Signed)
Previously on Abilify and Neurontin. Per [facility] D/C summary, she was switched to Zyprexa for psychosis at night. CTAP to r/o ovarian germ cell tumor was unremarkable. HIV, RPR, Ammonia, TSH, vit B12, B1, vitE, vitD unremarkable or within normal limits.   - consult to psych, appreciate recommendations, started on meds as above -continue sitter for patient safety  -Currently under IVC

## 2022-09-01 NOTE — Progress Notes (Addendum)
Patient ID: [name], female   DOB: 1971-09-19, 51 y.o.   MRN: [id number]   [facility] Charge RN was called for report.
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
## 2022-09-01 NOTE — Assessment & Plan Note (Addendum)
On Xanax.  -PDMP reviewed, receives rx for Xanax monthly, last fill in August -Restart Xanax 1 mg BID PRN for anxiety

## 2022-09-01 NOTE — Assessment & Plan Note (Addendum)
Work-up thus far is unremarkable though still ongoing. CIWA 1 overnight. MRI was mildly motion degraded, otherwise normal. Suspect acute change likely behavioral.  - Neurology consulted appreciate care and recommendations - Psychiatry consulted, appreciate care and recommendations -F/u EEG. If negative no further w/u per Neuro  -OT/PT/SLP eval and treat

## 2022-09-01 NOTE — H&P (Cosign Needed Addendum)
Hospital Admission History and Physical Service Pager: [phone]  Patient name: [name] Medical record number: [id number] Date of Birth: May 12, 1971 Age: 51 y.o. Gender: female  Primary Care Provider: Pcp, No Consultants: Neurology  Code Status: FULL, patient is level 5 caveat  Preferred Emergency Contact:  Contact Information     Name Relation Home Work Mobile   Axelson,Cody Son   [phone]   Couvillon,Casey Pandora Leiter   [phone]   Royetta Car   [phone]       Chief Complaint: Aphasia, behavioral changes   Assessment and Plan: [name] is a 51 y.o. female presenting with aphasia and behavioral changes. PMHx of bipolar disorder, ADD, anxiety. Differential for this patient's presentation of this includes organic causes, drug withdrawal, and worsening psychiatric disorders. Work up for stroke initiated with MRI pending, CT scan negative for hem and worsening physiatric disorders. CIWA and COWS for benzo abuse.   * Aphasia Trouble with word finding and stuttering when attempting to speak. Very tearful on interview. CT head negative - admit to FMTS, attending Dr. Andria Frames  - med/tele  - continue sitter for patient safety  - NPO pending speech eval  - MRI pending  - Neurology consulted, and recommended further workup  - PT/OT - order A1c, lipid, TSH   Benzodiazepine abuse ([place]) History of benzo abuse with Xanax - CIWA and COWS while inpatient   MDD (major depressive disorder), recurrent, severe, with psychosis ([place]) Previously on Abilify and Neurontin - consult to psych, appreciate recommendations   Protein-calorie malnutrition ([place]) BMI 18. Refusing to eat.  - supplemental nutrition shakes  - start IV maintenance fluids   Elevated serum hCG Patient had elevated Hcg 3 months ago. Likely perimenopausal with questionable relationship to antipsychotics.  - continue to trend    FEN/GI: swallow evaluation   VTE Prophylaxis: Lovenox  Disposition:  med-tele  History of Present Illness:  [name] is a 51 y.o. female presenting with onset of aphasia and behavioral changes onset this AM. Most of history provided by EDP on sign out as patient is level 5 caveat. Patient reports that she has had a stroke before, does not remember how long ago that it was.   Patient was at [facility] facility with onset of new symptoms of word finding and frustration with inability to express herself.   In the ED, spoke with neurology while ED and they recommended admission for further work up. CT head was negative, CBC and BMP WNL. 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   Review Of Systems: Per HPI with the following additions: pain "everywhere"  Pertinent Past Medical History: Past Medical History:  Diagnosis Date   ADHD (attention deficit hyperactivity disorder)    Anxiety    Depression    Hx of staphylococcal septicemia     Remainder reviewed in history tab.   Pertinent Past Surgical History: Past Surgical History:  Procedure Laterality Date   ABDOMINAL HYSTERECTOMY     BACK SURGERY     left elbow surgery     VIDEO BRONCHOSCOPY N/A 01/27/2013   Procedure: VIDEO BRONCHOSCOPY WITH FLUORO;  Surgeon: Elsie Stain, MD;  Location: [facility];  Service: Cardiopulmonary;  Laterality: N/A;    Remainder reviewed in history tab.  Pertinent Social History: Tobacco use: Former, quit "many years ago" Alcohol use: denies Other Substance use: denies  Pertinent Family History: History reviewed. No pertinent family history.  Remainder reviewed in history tab.   Important Outpatient Medications:  Remainder reviewed  in medication history.   Objective: BP 112/82   Pulse 71   Temp 98 F (36.7 C) (Axillary)   Resp 16   SpO2 96%  Exam: General: tearful, appropriately responsive, NAD Eyes: pupils equal and reactive  Cardiovascular: regular rate, regular rhythm, no murmurs  Respiratory: clear, no wheezing, no increased work of breathing   Gastrointestinal: non-distended  MSK: strength equal and 5/5 in all four extremities  Neuro: no focal deficits, no facial droop, reported decreased sensation on the left leg. Trouble with word finding at times with stuttering.  Psych: IVC, tearful  Labs:  CBC BMET  Recent Labs  Lab 09/01/22 1131  WBC 7.0  HGB 14.1  HCT 41.7  PLT 288   Recent Labs  Lab 09/01/22 1131  NA 136  K 4.4  CL 108  CO2 27  BUN 13  CREATININE 0.63  GLUCOSE 84  CALCIUM 8.9      EKG: QT 453, NSR    Imaging Studies Performed:   CT Head Wo Contrast  Result Date: 08/31/2022 CLINICAL DATA:  Mental status change, unknown cause. EXAM: CT HEAD WITHOUT CONTRAST TECHNIQUE: Contiguous axial images were obtained from the base of the skull through the vertex without intravenous contrast. RADIATION DOSE REDUCTION: This exam was performed according to the departmental dose-optimization program which includes automated exposure control, adjustment of the mA and/or kV according to patient size and/or use of iterative reconstruction technique. COMPARISON:  Head CT 08/28/2022 FINDINGS: Brain: There is no evidence of an acute infarct, intracranial hemorrhage, mass, midline shift, or extra-axial fluid collection. The ventricles and sulci are normal. Vascular: No hyperdense vessel. Skull: No fracture or suspicious osseous lesion. Sinuses/Orbits: Visualized paranasal sinuses and mastoid air cells are clear. Unremarkable orbits. Other: None. IMPRESSION: Negative head CT. Electronically Signed   By: Logan Bores M.D.   On: 08/31/2022 14:00    Darci Current, DO 09/01/2022, 2:55 PM PGY-2, [place] Intern pager: [phone], text pages welcome Secure chat group [facility] Teaching Service    Upper Level Addendum:  I have seen and evaluated this patient along with Dr. Sabra Heck and reviewed the above note, making necessary revisions as appropriate.  I agree with the medical decision  making and physical exam as noted above.  Erskine Emery, MD PGY-2 [facility]

## 2022-09-01 NOTE — Progress Notes (Signed)
LTM EEG hooked up and running - no initial skin breakdown - push button tested - neuro notified. [facility] monitoring.  

## 2022-09-01 NOTE — ED Notes (Signed)
IVC paperwork for patient was left at [facility], reached out to facility and paperwork is being picked up by [facility] security to be brought to emergency room.

## 2022-09-01 NOTE — ED Notes (Signed)
Pt not speaking upon arrival. Some assessments unable to be obtained. Will continue to monitor pt.

## 2022-09-01 NOTE — BHH Suicide Risk Assessment (Addendum)
[facility] Discharge Suicide Risk Assessment   Principal Problem: MDD (major depressive disorder), recurrent, severe, with psychosis ([place]) Discharge Diagnoses: Principal Problem:   MDD (major depressive disorder), recurrent, severe, with psychosis ([place]) Active Problems:   GAD (generalized anxiety disorder)   Social anxiety disorder   Cannabis use disorder   Reason for admission: [name] is a 51 y.o., female with a past psychiatric history significant for MDD, ADHD, GAD, social anxiety who presents to the [facility] voluntarily from [facility] Emergency department for evaluation and management of bizarre behavior and multiple somatic complaints, potential seizure-like activity. See H&P for details.  Presentation  General Appearance:Disheveled Eye Contact:Fair Speech:Other (comment) (incoherent, unable to get her words out, when able, she repeats the same word multiple times) Volume:Decreased Handedness:Right  Mood and Affect  Mood:Depressed Affect:Appropriate, Blunt, Congruent, Depressed  Thought Process  Thought Process:Other (comment) (limited by speech difficulties) Descriptions of Associations: (limited due to speech difficulties)  Thought Content Suicidal Thoughts:Suicidal Thoughts: No Homicidal Thoughts:Homicidal Thoughts: No Hallucinations:Hallucinations: None Ideas of Reference:None Thought Content:Other (comment) (limited due to speech difficulties. comprehension intact, knodded yes and no - denied SI/HI/AVH, paranoia)  Sensorium  Memory:Immediate Fair Judgment:Impaired Insight:Shallow  Executive Functions  Orientation:Partial (oriented to self and year. said she was in the ED, declined to participate in further questioning.) Language:Poor (nonfluent and difficulty with speech output, word finding difficulties (unable to name watch and pen), can follow simple commands) Concentration:Poor [place]  Psychomotor Activity  Psychomotor Activity:Psychomotor Activity: Other (comment)  Assets  Assets:Desire for Improvement  Sleep  Quality:Poor  Physical Exam Constitutional:      General: She is not in acute distress.    Appearance: She is ill-appearing. She is not toxic-appearing or diaphoretic.  HENT:     Head: Normocephalic.  Neurological:     Mental Status: She is alert.    Orientation:Partial (oriented to self and year. said she was in the ED, declined to participate in further questioning.) Language:Poor (nonfluent and difficulty with speech output, word finding difficulties (unable to name watch and pen), can follow simple commands) Speech:Other (comment) (incoherent, unable to get her words out, when able, she repeats the same word multiple times)  ROS: Unable to obtain due to speech difficulties  Blood pressure 103/74, pulse 95, temperature 98.4 F (36.9 C), temperature source Oral, resp. rate 18, height '5\' 4"'$  (1.626 m), weight 48.5 kg, SpO2 99 %. Body mass index is 18.37 kg/m.  Mental Status Per Nursing Assessment::    Demographic Factors:  Caucasian, Low socioeconomic status, Living alone, and Unemployed  Loss Factors: Financial problems/change in socioeconomic status  Historical Factors: Impulsivity  Risk Reduction Factors:   NA  Continued Clinical Symptoms:  Alcohol/Substance Abuse/Dependencies More than one psychiatric diagnosis Previous Psychiatric Diagnoses and Treatments  Cognitive Features That Contribute To Risk:  Loss of executive function    Suicide Risk:  Mild:  Given recent agitation, confusion, and loss of executive function.   [place]. Go to.   Specialty: Behavioral Health Why: Please go to this provider to obtain therapy and medication management services, on Monday or Wednesday, arrive by 7:30 am.  Services are provided on a first come, first served  basis. Contact information: [place] [phone]        Primary Care at [facility] Follow up.   Specialty: Family Medicine Contact information: [address] [place]  [phone]                Discharge recommendations:   Patient was sent to ED on 9/16 again for concern for worsening mental status issues that are progressive, word finding and naming difficulties, and worsening issues with speech and articulation to r/o acute neurologic issue. Patient is under IVC and should remain with sitter and suicide precautions. Would recommend psych CL service consult to follow during admission.    Total Time Spent in Direct Patient Care:  I personally spent 45 minutes on the unit in direct patient care. The direct patient care time included face-to-face time with the patient, reviewing the patient's chart, communicating with other professionals, and coordinating care. Greater than 50% of this time was spent in counseling or coordinating care with the patient regarding goals of hospitalization, psycho-education, and discharge planning needs.   Signed: Merrily Brittle, DO Psychiatry Resident, PGY-2 [facility] 09/01/2022, 1:21 PM

## 2022-09-01 NOTE — ED Notes (Signed)
Pt noted to be sitting up on the on the stretcher eating. No distress noted.

## 2022-09-01 NOTE — ED Notes (Signed)
Pt stated "hi" when this nurse walked into the room. Pt follows commands. Pt refusing to answer questions at this time.

## 2022-09-01 NOTE — ED Notes (Signed)
Pt able to ambulate with minimal assistance to [facility]. Pt assisted back onto stretcher and provided warm blanket.

## 2022-09-01 NOTE — ED Notes (Signed)
Called pt Son in chart. No answer.

## 2022-09-01 NOTE — ED Notes (Signed)
Report given and care endorsed to [facility]

## 2022-09-02 ENCOUNTER — Observation Stay (HOSPITAL_COMMUNITY): Payer: Medicare HMO

## 2022-09-02 DIAGNOSIS — F315 Bipolar disorder, current episode depressed, severe, with psychotic features: Secondary | ICD-10-CM | POA: Diagnosis not present

## 2022-09-02 DIAGNOSIS — F1990 Other psychoactive substance use, unspecified, uncomplicated: Secondary | ICD-10-CM

## 2022-09-02 DIAGNOSIS — F444 Conversion disorder with motor symptom or deficit: Secondary | ICD-10-CM | POA: Diagnosis not present

## 2022-09-02 DIAGNOSIS — R4182 Altered mental status, unspecified: Secondary | ICD-10-CM

## 2022-09-02 DIAGNOSIS — R4701 Aphasia: Secondary | ICD-10-CM

## 2022-09-02 DIAGNOSIS — E441 Mild protein-calorie malnutrition: Secondary | ICD-10-CM

## 2022-09-02 DIAGNOSIS — F319 Bipolar disorder, unspecified: Secondary | ICD-10-CM

## 2022-09-02 LAB — LIPID PANEL
Cholesterol: 180 mg/dL (ref 0–200)
HDL: 68 mg/dL (ref >40)
LDL Cholesterol: 104 mg/dL — ABNORMAL HIGH (ref 0–99)
Total CHOL/HDL Ratio: 2.6 RATIO
Triglycerides: 38 mg/dL (ref <150)
VLDL: 8 mg/dL (ref 0–40)

## 2022-09-02 LAB — COMPREHENSIVE METABOLIC PANEL
ALT: 13 U/L (ref 0–44)
AST: 18 U/L (ref 15–41)
Albumin: 3.7 g/dL (ref 3.5–5.0)
Alkaline Phosphatase: 58 U/L (ref 38–126)
Anion gap: 9 (ref 5–15)
BUN: 8 mg/dL (ref 6–20)
CO2: 24 mmol/L (ref 22–32)
Calcium: 9.3 mg/dL (ref 8.9–10.3)
Chloride: 104 mmol/L (ref 98–111)
Creatinine, Ser: 0.51 mg/dL (ref 0.44–1.00)
GFR, Estimated: >60 mL/min (ref >60)
Glucose, Bld: 92 mg/dL (ref 70–99)
Potassium: 4.1 mmol/L (ref 3.5–5.1)
Sodium: 137 mmol/L (ref 135–145)
Total Bilirubin: 0.7 mg/dL (ref 0.3–1.2)
Total Protein: 6.2 g/dL — ABNORMAL LOW (ref 6.5–8.1)

## 2022-09-02 LAB — CBC
HCT: 39.4 % (ref 36.0–46.0)
Hemoglobin: 14.1 g/dL (ref 12.0–15.0)
MCH: 32.6 pg (ref 26.0–34.0)
MCHC: 35.8 g/dL (ref 30.0–36.0)
MCV: 91 fL (ref 80.0–100.0)
Platelets: 244 10*3/uL (ref 150–400)
RBC: 4.33 MIL/uL (ref 3.87–5.11)
RDW: 12.9 % (ref 11.5–15.5)
WBC: 7.4 10*3/uL (ref 4.0–10.5)
nRBC: 0 % (ref 0.0–0.2)

## 2022-09-02 LAB — HEMOGLOBIN A1C
Hgb A1c MFr Bld: 5 % (ref 4.8–5.6)
Mean Plasma Glucose: 96.8 mg/dL

## 2022-09-02 MED ORDER — GADOPICLENOL 0.5 MMOL/ML IV SOLN: 5.0000 mL | Freq: Once | INTRAVENOUS | Status: DC | PRN

## 2022-09-02 MED ORDER — ALPRAZOLAM 0.5 MG PO TABS
1.0000 mg | ORAL_TABLET | Freq: Two times a day (BID) | ORAL | Status: DC | PRN
  Administered 2022-09-02 (×2): 1 mg via ORAL
  Filled 2022-09-02 (×2): qty 2

## 2022-09-02 NOTE — Progress Notes (Signed)
FMTS Brief Progress Note  S:Paged by nurse that patient was hysterically crying, stating she wants to die, attempting to pull everything out and trying to get out of bed. Went to see patient. Patient sitting up in bed and speaking with mumbled speech. She started to eat graham crackers by end of interview.   O: BP 127/85 (BP Location: Right Arm)   Pulse 78   Temp 98 F (36.7 C) (Axillary)   Resp 16   SpO2 98%    NEURO: Alert. When asked to state name, she pointed to her wristband. When asked questions, she has intermittently garbled speech in response, difficult to understand.   A/P: Aphasia   Per neuro, thought to be a functional speech impairment. EEG without seizures or epileptiform discharges. MRI unremarkable.  -can consider d/c tele to decrease patient agitation  -Has PRN xanax  -psychiatry CL service seen today with no additional recs, will reevaluate tomorrow  Rolanda Lundborg, MD 09/02/2022, 2:27 PM PGY-1, [place] Night Resident  Please page [phone] with questions.

## 2022-09-02 NOTE — Progress Notes (Signed)
LTM EEG discontinued - no skin breakdown at unhook.   

## 2022-09-02 NOTE — Assessment & Plan Note (Addendum)
On dextroamphetamine 20 mg twice daily, last fill in July.  -Hold while inpatient

## 2022-09-02 NOTE — Progress Notes (Signed)
Patient reassessed; opened eyes and closed mouth when name was called. Did not answer question, but alert as prior assessment. Closed her eyes immediately when RN started asking questions.  Able to move all extremities (voluntary).

## 2022-09-02 NOTE — Progress Notes (Signed)
Patient crying hysterically, stating she wants to die. Hard to redirect. Family Med Tchg MD notified; no new med orders. Okay to put tele on standby.  1:1 sitter at bedside.

## 2022-09-02 NOTE — Progress Notes (Signed)
Neurology Progress Note  Brief HPI: Patient with history of bipolar disorder, anxiety and ADD was admitted from [facility] for investigation of sudden onset inability to speak.  Patient was able to communicate by writing full sentences.  MRI brain was negative and LTM EEG showed no seizure activity.  Subjective: Patient's eyes remain closed during exam and she does not follow commands.  Overnight, she has been able to communicate needs and was intermittently tearful.  Exam: Vitals:   09/02/22 0607 09/02/22 0726  BP: 110/84 102/73  Pulse: 60 69  Resp: 11 13  Temp: 98.4 F (36.9 C) 98.1 F (36.7 C)  SpO2: 100% 96%   Gen: In bed, NAD Resp: non-labored breathing, no acute distress  Neuro: Patient does not cooperate with exam this AM.  Eyes remain closed and patient does not respond to voice or light touch.  When patient's arms are held over her head and released, she gently lowers arms to her chest.  She will not follow commands but does attempt to speak at times.  PERRL, moves BUE purposefully, reflexes 2+  Pertinent Labs:    Latest Ref Rng & Units 09/02/2022    5:27 AM 09/01/2022   11:31 AM 08/31/2022    4:30 PM  CBC  WBC 4.0 - 10.5 K/uL 7.4  7.0  6.4   Hemoglobin 12.0 - 15.0 g/dL 14.1  14.1  13.3   Hematocrit 36.0 - 46.0 % 39.4  41.7  39.3   Platelets 150 - 400 K/uL 244  288  264        Latest Ref Rng & Units 09/02/2022    5:27 AM 09/01/2022   11:31 AM 08/31/2022    4:30 PM  BMP  Glucose 70 - 99 mg/dL 92  84  93   BUN 6 - 20 mg/dL '8  13  15   '$ Creatinine 0.44 - 1.00 mg/dL 0.51  0.63  0.54   Sodium 135 - 145 mmol/L 137  136  138   Potassium 3.5 - 5.1 mmol/L 4.1  4.4  3.5   Chloride 98 - 111 mmol/L 104  108  108   CO2 22 - 32 mmol/L '24  27  26   '$ Calcium 8.9 - 10.3 mg/dL 9.3  8.9  9.2      Imaging Reviewed:  CT head:  No acute abnormality   MRI brain: Normal for age brain MRI  Assessment: 51 year old patient with history of bipolar disorder, anxiety and ADD presents with  sudden onset inability to speak but preserved ability to write.  Patient does not cooperate with exam this AM but does move arms when they are held above her head and released.  MRI negative for acute abnormalities and LTM EEG negative for seizure activity.    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 No further inpatient neurologic workup indicated. Patient may return to [facility] from neuro standpoint. Neurology to sign off, but please re-engage if additional neurologic concerns arise.  Su Monks, MD [facility] [phone]  If 7pm- 7am, please page neurology on call as listed in [place].

## 2022-09-02 NOTE — Evaluation (Signed)
Physical Therapy Evaluation Patient Details Name: [name] MRN: [id number] DOB: December 14, 1971 Today's Date: 09/02/2022  History of Present Illness  Pt is a 51 y/o F presenting from [facility] with acute aphasia and behavioral changes. MRI negative. PMH includes bipolar disorder, ADD, and anxiety.  Clinical Impression  Pt admitted with above diagnosis. Pt received in bed, sitter present. Pt emotionally labile throughout with bouts of frustration and crying. Pt needed mod verbal and tactile cues for initiation of all mvmt. With initial standing needed mod A +2 with knees and trunk flexed but as she stayed standing she came to full upright and needed only min A +2 beside her for safety. Pt HR into 120's with standing and crying. Pt upset everytime she attempted to verbalize and could not get clear words out. Will follow acutely but expect that from a mobility standpoint she will progress well and not need post acute PT.  Pt currently with functional limitations due to the deficits listed below (see PT Problem List). Pt will benefit from skilled PT to increase their independence and safety with mobility to allow discharge to the venue listed below.          Recommendations for follow up therapy are one component of a multi-disciplinary discharge planning process, led by the attending physician.  Recommendations may be updated based on patient status, additional functional criteria and insurance authorization.  Follow Up Recommendations No PT follow up      Assistance Recommended at Discharge Frequent or constant Supervision/Assistance  Patient can return home with the following  A lot of help with walking and/or transfers;A lot of help with bathing/dressing/bathroom;Assistance with cooking/housework;Direct supervision/assist for medications management;Direct supervision/assist for financial management;Assist for transportation;Help with stairs or ramp for entrance    Equipment Recommendations Other  (comment) (TBD, expect none will be needed)  Recommendations for Other Services       Functional Status Assessment Patient has had a recent decline in their functional status and demonstrates the ability to make significant improvements in function in a reasonable and predictable amount of time.     Precautions / Restrictions Precautions Precautions: Fall Precaution Comments: has sitter Restrictions Weight Bearing Restrictions: No      Mobility  Bed Mobility Overal bed mobility: Needs Assistance Bed Mobility: Supine to Sit, Sit to Supine     Supine to sit: Min guard Sit to supine: Mod assist   General bed mobility comments: assist for bringing BLE's into bed    Transfers Overall transfer level: Needs assistance Equipment used: 2 person hand held assist Transfers: Sit to/from Stand Sit to Stand: Min assist, +2 safety/equipment           General transfer comment: at first maintained B knees flexed but with time extended knees and trunk and came to upright with min A +2 for safety but no longer needing to hold pt up.    Ambulation/Gait               General Gait Details: attempted to have pt ambulate but she was confused by command, had difficulty stepping feet, and then began crying again. Could not help pt understand that we were going to help her walk  Stairs            Wheelchair Mobility    Modified Rankin (Stroke Patients Only)       Balance Overall balance assessment: Needs assistance Sitting-balance support: Feet supported Sitting balance-Leahy Scale: Fair Sitting balance - Comments: fair, approching good by end of  session   Standing balance support: Bilateral upper extremity supported, During functional activity Standing balance-Leahy Scale: Poor Standing balance comment: reliant on external support                             Pertinent Vitals/Pain Pain Assessment Pain Assessment: Faces Faces Pain Scale: Hurts little  more Pain Location: generalized with movement, pt unable to state Pain Descriptors / Indicators: Discomfort, Crying Pain Intervention(s): Limited activity within patient's tolerance, Monitored during session    Home Living Family/patient expects to be discharged to:: Other (Comment) (From [facility]al [facility] hospital)                        Prior Function Prior Level of Function : Patient poor historian/Family not available             Mobility Comments: pt unable to state, per sitter in room pt needed total A to transfer to [facility] ADLs Comments: pt unable to state, assuming assistance needed     Hand Dominance        Extremity/Trunk Assessment   Upper Extremity Assessment Upper Extremity Assessment: Defer to OT evaluation    Lower Extremity Assessment Lower Extremity Assessment: Generalized weakness    Cervical / Trunk Assessment Cervical / Trunk Assessment: Normal  Communication   Communication: Expressive difficulties;Receptive difficulties (crying throughout)  Cognition Arousal/Alertness: Suspect due to medications, Lethargic Behavior During Therapy: WFL for tasks assessed/performed, Anxious, Flat affect Overall Cognitive Status: No family/caregiver present to determine baseline cognitive functioning                                 General Comments: pt emotionally labile during session, crying throughout. Pt following commands with increased time, increased crying/frustration with mildly challenging tasks (getting tissues out of tissue box, holding drink cup, stepping to R side). Attempting to speak at times but cannot get words out intelligibly and then begins crying        General Comments General comments (skin integrity, edema, etc.): HR up to 124 bpm with stepping to R and crying    Exercises     Assessment/Plan    PT Assessment Patient needs continued PT services  PT Problem List Decreased strength;Decreased activity  tolerance;Decreased balance;Decreased mobility;Decreased cognition;Decreased knowledge of precautions       PT Treatment Interventions DME instruction;Gait training;Functional mobility training;Therapeutic activities;Therapeutic exercise;Balance training;Patient/family education;Cognitive remediation    PT Goals (Current goals can be found in the Care Plan section)  Acute Rehab PT Goals Patient Stated Goal: none stated PT Goal Formulation: Patient unable to participate in goal setting Time For Goal Achievement: 09/16/22 Potential to Achieve Goals: Fair    Frequency Min 3X/week     Co-evaluation PT/OT/SLP Co-Evaluation/Treatment: Yes Reason for Co-Treatment: For patient/therapist safety;Necessary to address cognition/behavior during functional activity PT goals addressed during session: Mobility/safety with mobility;Balance OT goals addressed during session: ADL's and self-care       AM-PAC PT "6 Clicks" Mobility  Outcome Measure Help needed turning from your back to your side while in a flat bed without using bedrails?: A Little Help needed moving from lying on your back to sitting on the side of a flat bed without using bedrails?: A Little Help needed moving to and from a bed to a chair (including a wheelchair)?: A Lot Help needed standing up from a chair using your arms (e.g.,  wheelchair or bedside chair)?: Total Help needed to walk in hospital room?: Total Help needed climbing 3-5 steps with a railing? : Total 6 Click Score: 11    End of Session Equipment Utilized During Treatment: Gait belt Activity Tolerance: Other (comment) (limited by emotional lability) Patient left: in bed;with call bell/phone within reach;with nursing/sitter in room Nurse Communication: Mobility status PT Visit Diagnosis: Muscle weakness (generalized) (M62.81);Difficulty in walking, not elsewhere classified (R26.2)    Time: 2091-9802 PT Time Calculation (min) (ACUTE ONLY): 26 min   Charges:    PT Evaluation $PT Eval Moderate Complexity: [place] chat preferred Office [place] 09/02/2022, 2:52 PM

## 2022-09-02 NOTE — Progress Notes (Signed)
  [facility] Adult Case Management Discharge Plan :  Will you be returning to the same living situation after discharge:  No.  Admitted to medical floor At discharge, do you have transportation?: Yes,  was taken to medical unit by ambulance Do you have the ability to pay for your medications: Yes,  insurance  Release of information consent forms were not completed, as patient's Psychosocial Assessment had not yet been done.  Patient to Follow up at:  [facility]. Go to.   Specialty: Behavioral Health Why: Please go to this provider to obtain therapy and medication management services, on Monday or Wednesday, arrive by 7:30 am.  Services are provided on a first come, first served basis. Contact information: [place] [phone]        [facility] Follow up.   Specialty: Family Medicine Contact information: [address], [place] [phone]                Next level of care provider has access to [place] and Suicide Prevention discussed: No.  Patient was discharged before Psychosocial Assessment could be done and before any consents for SPE could be signed     Has patient been referred to the Quitline?: N/A patient is not a smoker  Patient has been referred for addiction treatment: [place], LCSW 09/02/2022, 4:47 PM

## 2022-09-02 NOTE — Procedures (Signed)
Patient Name: [name]  MRN: [id number]  Epilepsy Attending: Lora Havens  Referring Physician/Provider: Katy Apo, NP  Duration: 09/01/2022 1855 to 09/02/2022 1053  Patient history: 51 y.o. female with history of bipolar disorder, ADD and anxiety who comes to the ED from [facility] with difficulty speaking that began this morning.EEG to evaluate for seizure  Level of alertness: Awake, asleep  AEDs during EEG study: None  Technical aspects: This EEG study was done with scalp electrodes positioned according to the 10-20 International system of electrode placement. Electrical activity was reviewed with band pass filter of 1-'70Hz'$ , sensitivity of 7 uV/mm, display speed of 54m/sec with a '60Hz'$  notched filter applied as appropriate. EEG data were recorded continuously and digitally stored.  Video monitoring was available and reviewed as appropriate.  Description: The posterior dominant rhythm consists of 9-Hz activity of moderate voltage (25-35 uV) seen predominantly in posterior head regions, symmetric and reactive to eye opening and eye closing. Sleep was characterized by vertex waves, sleep spindles (12 to 14 Hz), maximal frontocentral region.  There is an excessive amount of 15 to 18 Hz beta activity distributed symmetrically and diffusely.  Hyperventilation and photic stimulation were not performed.     ABNORMALITY - Excessive beta, generalized  IMPRESSION: This study is within normal limits. The excessive beta activity seen in the background is most likely due to the effect of benzodiazepine and is a benign EEG pattern. No seizures or epileptiform discharges were seen throughout the recording.  Val Farnam OBarbra Sarks

## 2022-09-02 NOTE — Progress Notes (Signed)
SLP Cancellation Note  Patient Details Name: [name] MRN: [id number] DOB: 1971-11-19   Cancelled treatment:       Reason Eval/Treat Not Completed: Patient's level of consciousness;Fatigue/lethargy limiting ability to participate  Pt given sedating medication overnight for MRI; remains unable to arouse despite max cues (sternal rub). 1:1 sitter at bedside.  SLP service to follow up as pt is ready.  Donshay Lupinski P. Drishti Pepperman, M.S., CCC-SLP Speech-Language Pathologist Acute Rehabilitation Services Pager: [place] 09/02/2022, 9:52 AM

## 2022-09-02 NOTE — Progress Notes (Signed)
Daily Progress Note Intern Pager: [phone]  Patient name: [name] Medical record number: [id number] Date of birth: October 17, 1971 Age: 51 y.o. Gender: female  Primary Care Provider: Pcp, No Consultants: Neurology, Psychiatry  Code Status: FULL CODE   Pt Overview and Major Events to Date:  Admitted 9/16  Assessment and Plan: [name] is a 51 y.o. female who was admitted for acute aphasia and behavioral change from the [facility]. She has a PMHx significant for bipolar disorder, ADD, anxiety.   * Aphasia Work-up thus far is unremarkable though still ongoing. CIWA 1 overnight. MRI was mildly motion degraded, otherwise normal. Suspect acute change likely behavioral.  - Neurology consulted appreciate care and recommendations - Psychiatry consulted, appreciate care and recommendations -F/u EEG. If negative no further w/u per Neuro  -OT/PT/SLP eval and treat  Anxiety On Xanax.  -CIWA monitoring  -PDMP reviewed, receives rx for Xanax monthly, last fill in August -Restart Xanax 1 mg BID PRN for anxiety   MDD (major depressive disorder), recurrent, severe, with psychosis ([place]) Previously on Abilify and Neurontin. Per [facility] D/C summary, appears they switched her to Zyprexa for psychosis at night.  - consult to psych, appreciate recommendations  -continue sitter for patient safety  -Currently under IVC  Protein-calorie malnutrition ([place]) BMI 18. Refusing to eat.  - supplemental nutrition shakes  - start IV maintenance fluids   Elevated serum hCG Patient had elevated Hcg 3 months ago. Likely perimenopausal with questionable relationship to antipsychotics.  -No further work up needed  Attention deficit hyperactivity disorder (ADHD) On dextroamphetamine 20 mg twice daily, last fill in July.  -Hold while inpatient    FEN/GI: Regular diet  PPx: Lovenox  Dispo: Pending Psych recommendations, ?Transfer back to [facility]  pending clinical improvement . Barriers  include MRI, EEG, neurology recommendations.   Subjective:  Level 5 caveat. She is unable to communicate with me, intermittently mumbles but speech is incomprehensible. She is able to follow commands sluggishly. Sitters present in room. Discussed with overnight RN who notes patient did eat dinner and was able to communicate with sitters when she was hungry or needed to pee. RN notes she did intermittently sob overnight. EEG ongoing.   Objective: Temp:  [97.3 F (36.3 C)-98.7 F (37.1 C)] 98.7 F (37.1 C) (09/17 0426) Pulse Rate:  [64-108] 64 (09/17 0426) Resp:  [12-23] 19 (09/17 0426) BP: (103-147)/(73-135) 118/84 (09/17 0426) SpO2:  [94 %-100 %] 98 % (09/17 0426) Weight:  [48.5 kg] 48.5 kg (09/16 0615) Physical Exam: General: Appears older than stated age, poor dentition. Eyes closed but sluggishly follows commands, speech is sparse and incomprehensible  Cardiovascular: RRR without murmur Respiratory: Clear in anterior fields Abdomen: Soft, non-tender in all quadrants, non-distended Extremities: No edema  Neuro: Not able to assess orientation. Speech is incomprehensible. No focal commands. Sluggishly follows all commands- able to just slightly raise eyebrows, slightly smile, barely opens eyes, able to wiggle fingers and toes and hold legs up against gravity   Laboratory: Most recent CBC Lab Results  Component Value Date   WBC 7.0 09/01/2022   HGB 14.1 09/01/2022   HCT 41.7 09/01/2022   MCV 93.5 09/01/2022   PLT 288 09/01/2022   Most recent BMP    Latest Ref Rng & Units 09/01/2022   11:31 AM  BMP  Glucose 70 - 99 mg/dL 84   BUN 6 - 20 mg/dL 13   Creatinine 0.44 - 1.00 mg/dL 0.63   Sodium 135 - 145  mmol/L 136   Potassium 3.5 - 5.1 mmol/L 4.4   Chloride 98 - 111 mmol/L 108   CO2 22 - 32 mmol/L 27   Calcium 8.9 - 10.3 mg/dL 8.9     Other pertinent labs Hgb A1c and lipids pending.   Imaging/Diagnostic Tests: 1-view CXR: IMPRESSION: No acute abnormality of the lungs in  AP portable projection. No unexpected radiopaque foreign body in the chest. 1-view Abd DG: IMPRESSION: Negative.  MRI Brain W/O IMPRESSION: Mildly motion degraded exam. Normal for age noncontrast MRI appearance of the brain.  Sharion Settler, DO 09/02/2022, 5:54 AM  PGY-3, [place] Intern pager: [phone], text pages welcome Secure chat group [place]

## 2022-09-02 NOTE — Evaluation (Signed)
Occupational Therapy Evaluation Patient Details Name: [name] MRN: [id number] DOB: Mar 05, 1971 Today's Date: 09/02/2022   History of Present Illness Pt is a 51 y/o F presenting from [facility] with acute aphasia and behavioral changes. MRI negative. PMH includes bipolar disorder, ADD, and anxiety.   Clinical Impression   Pt with unclear baseline, needing min-mod A for ADLs, min guard-mod A for bed mobility, and min A+2 for transfers and side stepping at EOB. Pt emotionally labile, crying throughout session. Pt with expressive difficulties, often unable to verbalize needs, however at times stating " I don't know". Pt with HR above 120 with standing and side stepping at EOB. Pt presenting with impairments listed below, will follow acutely to maximize safety and independence with ADLs.      Recommendations for follow up therapy are one component of a multi-disciplinary discharge planning process, led by the attending physician.  Recommendations may be updated based on patient status, additional functional criteria and insurance authorization.   Follow Up Recommendations  Other (comment) (return to Behavioral health hospital)    Assistance Recommended at Discharge Frequent or constant Supervision/Assistance  Patient can return home with the following A lot of help with walking and/or transfers;A lot of help with bathing/dressing/bathroom;Assistance with cooking/housework;Assistance with feeding;Direct supervision/assist for medications management;Direct supervision/assist for financial management;Assist for transportation;Help with stairs or ramp for entrance    Functional Status Assessment  Patient has had a recent decline in their functional status and demonstrates the ability to make significant improvements in function in a reasonable and predictable amount of time.  Equipment Recommendations  Other (comment) (TBD)    Recommendations for Other Services PT consult     Precautions /  Restrictions Precautions Precautions: Fall Precaution Comments: has sitter Restrictions Weight Bearing Restrictions: No      Mobility Bed Mobility Overal bed mobility: Needs Assistance Bed Mobility: Supine to Sit, Sit to Supine     Supine to sit: Min guard Sit to supine: Mod assist   General bed mobility comments: assist for bringing BLE's into bed    Transfers Overall transfer level: Needs assistance Equipment used: 2 person hand held assist Transfers: Sit to/from Stand Sit to Stand: Min assist, +2 safety/equipment           General transfer comment: increased time and cues      Balance Overall balance assessment: Needs assistance Sitting-balance support: Feet supported Sitting balance-Leahy Scale: Fair Sitting balance - Comments: fair, approching good by end of session   Standing balance support: Bilateral upper extremity supported, During functional activity Standing balance-Leahy Scale: Poor Standing balance comment: reliant on external support                           ADL either performed or assessed with clinical judgement   ADL Overall ADL's : Needs assistance/impaired Eating/Feeding: Moderate assistance Eating/Feeding Details (indicate cue type and reason): difficulty holding drink cup, finger feeds food Grooming: Moderate assistance   Upper Body Bathing: Moderate assistance   Lower Body Bathing: Moderate assistance   Upper Body Dressing : Moderate assistance   Lower Body Dressing: Moderate assistance Lower Body Dressing Details (indicate cue type and reason): weak grasp, unable to pull up sock Toilet Transfer: Minimal assistance;+2 for safety/equipment;Stand-pivot;BSC/3in1 Toilet Transfer Details (indicate cue type and reason): simulated via functional mobility Toileting- Clothing Manipulation and Hygiene: Supervision/safety       Functional mobility during ADLs: Minimal assistance;+2 for safety/equipment       Vision  Additional Comments: unable to assess     Perception     Praxis      Pertinent Vitals/Pain Pain Assessment Pain Assessment: Faces Pain Score: 4  Faces Pain Scale: Hurts little more Pain Location: generalized with movement, pt unable to state Pain Descriptors / Indicators: Discomfort, Crying Pain Intervention(s): Limited activity within patient's tolerance, Monitored during session     Hand Dominance     Extremity/Trunk Assessment Upper Extremity Assessment Upper Extremity Assessment: Generalized weakness (decreased fine and gross motor coordination)   Lower Extremity Assessment Lower Extremity Assessment: Defer to PT evaluation   Cervical / Trunk Assessment Cervical / Trunk Assessment: Normal   Communication Communication Communication: Expressive difficulties (crying throughout)   Cognition Arousal/Alertness: Suspect due to medications Behavior During Therapy: WFL for tasks assessed/performed, Anxious, Flat affect Overall Cognitive Status: No family/caregiver present to determine baseline cognitive functioning                                 General Comments: pt emotionally labile during session, crying throughout. Pt following commands with increased time, increased crying/frustration with mildly challenging tasks (getting tissues out of tissue box, holding drink cup, stepping to R side)     General Comments  HR up to 124 with standing/stepping to R    Exercises     Shoulder Instructions      Home Living Family/patient expects to be discharged to:: Other (Comment) (From Behavioral health hospital)                                        Prior Functioning/Environment Prior Level of Function : Patient poor historian/Family not available             Mobility Comments: pt unable to state, per sitter in room pt needed total A to transfer to [facility] ADLs Comments: pt unable to state, assuming assistance needed        OT Problem  List: Decreased strength;Decreased range of motion;Decreased activity tolerance;Impaired balance (sitting and/or standing);Decreased cognition;Decreased safety awareness      OT Treatment/Interventions: Self-care/ADL training;Therapeutic exercise;Energy conservation;DME and/or AE instruction;Therapeutic activities;Patient/family education;Visual/perceptual remediation/compensation;Cognitive remediation/compensation;Balance training    OT Goals(Current goals can be found in the care plan section) Acute Rehab OT Goals Patient Stated Goal: none stated OT Goal Formulation: With patient Time For Goal Achievement: 09/16/22 Potential to Achieve Goals: Good ADL Goals Pt Will Perform Grooming: standing;with supervision Pt Will Perform Upper Body Dressing: with supervision;sitting;standing Pt Will Perform Lower Body Dressing: with min guard assist;sitting/lateral leans;sit to/from stand Pt Will Transfer to Toilet: with min guard assist;ambulating;regular height toilet Additional ADL Goal #1: pt will gather 3 items needed for ADL task in order to promote independence with ADLs  OT Frequency: Min 2X/week    Co-evaluation PT/OT/SLP Co-Evaluation/Treatment: Yes Reason for Co-Treatment: For patient/therapist safety;To address functional/ADL transfers;Necessary to address cognition/behavior during functional activity   OT goals addressed during session: ADL's and self-care      AM-PAC OT "6 Clicks" Daily Activity     Outcome Measure Help from another person eating meals?: A Little Help from another person taking care of personal grooming?: A Little Help from another person toileting, which includes using toliet, bedpan, or urinal?: A Little Help from another person bathing (including washing, rinsing, drying)?: A Lot Help from another person to put on and taking off regular  upper body clothing?: A Little Help from another person to put on and taking off regular lower body clothing?: A Lot 6 Click  Score: 16   End of Session Nurse Communication: Mobility status  Activity Tolerance: Patient tolerated treatment well Patient left: in bed;with call bell/phone within reach;with nursing/sitter in room  OT Visit Diagnosis: Unsteadiness on feet (R26.81);Other abnormalities of gait and mobility (R26.89);Muscle weakness (generalized) (M62.81);Other symptoms and signs involving cognitive function                Time: 8413-2440 OT Time Calculation (min): 28 min Charges:  OT General Charges $OT Visit: 1 Visit OT Evaluation $OT Eval Moderate Complexity: 1 [address], OTD, OTR/L Acute Rehab [phone] [place] 09/02/2022, 2:03 PM

## 2022-09-02 NOTE — Consult Note (Signed)
Patient seen face to face today. She is calm but lethargic, unable to participate in psychiatric assessment. She is able to open her eyes but non-verbal. 1:1 sitter reports that patient has been sleeping all day today and not agitated or disruptive.  Plan discussed with the Family medicine resident: Psychiatric service will attempt to re-assess patient tomorrow  Dr. Corena Pilgrim Attending psychiatrist

## 2022-09-03 DIAGNOSIS — E441 Mild protein-calorie malnutrition: Secondary | ICD-10-CM | POA: Diagnosis not present

## 2022-09-03 DIAGNOSIS — F333 Major depressive disorder, recurrent, severe with psychotic symptoms: Secondary | ICD-10-CM | POA: Diagnosis not present

## 2022-09-03 DIAGNOSIS — R4182 Altered mental status, unspecified: Secondary | ICD-10-CM | POA: Diagnosis not present

## 2022-09-03 DIAGNOSIS — R4701 Aphasia: Secondary | ICD-10-CM | POA: Diagnosis not present

## 2022-09-03 DIAGNOSIS — F315 Bipolar disorder, current episode depressed, severe, with psychotic features: Secondary | ICD-10-CM | POA: Diagnosis not present

## 2022-09-03 LAB — VITAMIN E
Vitamin E (Alpha Tocopherol): 12.5 mg/L (ref 7.0–25.1)
Vitamin E(Gamma Tocopherol): 1.2 mg/L (ref 0.5–5.5)

## 2022-09-03 LAB — VITAMIN A: Vitamin A (Retinoic Acid): 28.8 ug/dL (ref 20.1–62.0)

## 2022-09-03 LAB — VITAMIN B1: Vitamin B1 (Thiamine): 119 nmol/L (ref 66.5–200.0)

## 2022-09-03 MED ORDER — ONDANSETRON HCL 4 MG/2ML IJ SOLN
4.0000 mg | Freq: Once | INTRAMUSCULAR | Status: AC
  Administered 2022-09-03: 4 mg via INTRAVENOUS
  Filled 2022-09-03: qty 2

## 2022-09-03 MED ORDER — CLONAZEPAM 0.5 MG PO TABS
0.5000 mg | ORAL_TABLET | Freq: Two times a day (BID) | ORAL | Status: DC
  Administered 2022-09-03 – 2022-09-05 (×5): 0.5 mg via ORAL
  Filled 2022-09-03 (×5): qty 1

## 2022-09-03 MED ORDER — THIAMINE MONONITRATE 100 MG PO TABS
100.0000 mg | ORAL_TABLET | Freq: Every day | ORAL | Status: DC
  Administered 2022-09-03 – 2022-09-05 (×3): 100 mg via ORAL
  Filled 2022-09-03 (×3): qty 1

## 2022-09-03 MED ORDER — OLANZAPINE 2.5 MG PO TABS
2.5000 mg | ORAL_TABLET | Freq: Every day | ORAL | Status: DC
  Administered 2022-09-03 – 2022-09-05 (×3): 2.5 mg via ORAL
  Filled 2022-09-03 (×3): qty 1

## 2022-09-03 MED ORDER — GLYCERIN (LAXATIVE) 2 G RE SUPP
1.0000 | Freq: Once | RECTAL | Status: AC
  Administered 2022-09-03: 1 via RECTAL
  Filled 2022-09-03 (×2): qty 1

## 2022-09-03 MED ORDER — OLANZAPINE 2.5 MG PO TABS
5.0000 mg | ORAL_TABLET | Freq: Every day | ORAL | Status: DC
  Administered 2022-09-03 – 2022-09-04 (×2): 5 mg via ORAL
  Filled 2022-09-03 (×2): qty 2

## 2022-09-03 NOTE — Care Management Obs Status (Signed)
[place] NOTIFICATION   Patient Details  Name: ELODIE PANAMENO MRN: [id number] Date of Birth: 06/14/1971   Medicare Observation Status Notification Given:  Yes    Geralynn Ochs, LCSW 09/03/2022, 3:09 PM

## 2022-09-03 NOTE — Plan of Care (Signed)

## 2022-09-03 NOTE — Hospital Course (Signed)
[name] is a 51 y.o. female presenting with aphasia and behavioral changes. PMHx of bipolar disorder, ADD, anxiety. The patient's presentation was thought to be due to worsening psychiatric disorder. Work up for stroke initiated with MRI pending, CT scan negative for hem and worsening physiatric disorders.    Aphasia Work up for stroke initiated given trouble with word finding and stuttering when attempting to speak. She was waxing and waning and intermittently answering questions. Neurology was consulted. CT scan negative for hemorrhage. MRI unremarkable. EEG no seizures. No further workup indicated.    MDD (major depressive disorder), recurrent, severe, with psychosis  Previously on Abilify and Neurontin. Discharged from [facility] on zyprexa. Psychiatry was consulted and recommended zyprexa, klonopin, and thiamine supplementation. CTAP obtained to rule out ovarian germ cell tumor and was unremarkable. Her aphasia improved with antipsychotic meds.   Benzodiazepine abuse  History of benzo abuse with Xanax. She was put on CIWA and COWS for benzo abuse. She did not exhibit signs of withdrawal during hospital admission. She was restarted on half of home dose of Klonopin.    Protein-calorie malnutrition  BMI 18. Refusing to eat. Supplemental nutrition shakes. Started on IV maintenance fluids and discontinued with improvement in PO intake.    Elevated serum hCG Patient had elevated Hcg 3 months ago. Likely perimenopausal with questionable relationship to antipsychotics.

## 2022-09-03 NOTE — Consult Note (Signed)
[facility] New Face-to-Face Psychiatric Evaluation   Name: [name] DOB: [birth year] MRN: [id number] Service Date: September 03, 2022 LOS:  LOS: 0 days  Reason for Consult: Referring Provider:   Assessment  [name] is a 51 y.o. female admitted medically for 09/01/2022 10:04 AM from [facility] for acute aphasia and mutism. She carries the psychiatric diagnoses of bipolar disorder, GAD ADHD, cannabis use disorder, and cocaine use disorder and has a past medical history of protein calorie malnutrition, elevated serum hCG.   Her current presentation of disorganized thoughts and speech, circumstantial speech, and paranoia is most consistent with psychosis.  At this time, we are unsure as to whether this psychosis is organic, secondary to a synthetic marijuana, or secondary to a medical cause (want to rule out B1 deficiency and ovarian germ cell tumor; it doesn't seem as though a mediastinal mass is present from prior imaging). She meets criteria for inpatient psychiatric admission once medically cleared based on active psychosis, to be reassessed as medical workup is obtained and resulted.  Current BH psychotropic medications include Zyprexa 5 mg nightly, gabapentin 400 mg 3 times daily, and Ativan 1 mg twice daily as needed and she has not yet had time to have a substantial response to these medications, as she was initially noncompliant with Abilify. Please see plan below for detailed recommendations.   Diagnoses:  Active Hospital problems: Principal Problem:   Aphasia Active Problems:   Attention deficit hyperactivity disorder (ADHD)   MDD (major depressive disorder), recurrent, severe, with psychosis ([place])   Anxiety   Protein-calorie malnutrition ([facility])   Elevated serum hCG   Acute alteration in mental status   Affective psychosis, bipolar ([place])   Substance use disorder     Plan  ## Safety and Observation Level:  - Based on my clinical evaluation, I estimate the  patient to be at moderate-severe risk of self harm in the current setting - At this time, we recommend continued 1:1 level of observation. This decision is based on my review of the chart including patient's history and current presentation, interview of the patient, mental status examination, and consideration of suicide risk including evaluating suicidal ideation, plan, intent, suicidal or self-harm behaviors, risk factors, and protective factors. This judgment is based on our ability to directly address suicide risk, implement suicide prevention strategies and develop a safety plan while the patient is in the clinical setting. Please contact our team if there is a concern that risk level has changed.   ## Medications:  -- START Zyprexa 2.5 mg daily, and 5 mg nightly - START Klonopin 0.5 mg twice daily (half dose of home Xanax 1 mg twice daily) - START empiric thiamine supplementation; Level pending - Consider starting B12 supplementation for borderline level  ## Medical Decision Making Capacity:  Formal decision making capacity was not assessed for any particular decision as part of routine psychiatric evaluation.   ## Further Work-up:  -- Recommend CT abdomen and pelvis with contrast (although patient with history of hysterectomy, she still has her ovaries; does have a history of prior right ovarian cyst which has resolved)   -- most recent EKG on 9/16 had QtC of 453 -- Pertinent labwork reviewed earlier this admission includes:  UDS positive for [facility] Serum beta hCG 9.8 (7.7 in December 2022) Total protein 6.2 CBC, A1c, RPR, HIV, ammonia, TSH, folate, urine pregnancy WNL  ## Disposition:  -- At this time, return to inpatient psychiatric hospital once medically cleared  ##  Behavioral / Environmental:  -- Recommend continuing 1:1 sitter  ##Legal Status IVC  Thank you for this consult request. Recommendations have been communicated to the primary team.  We will continue to follow at  this time.   Rosezetta Schlatter, MD   NEW history  Relevant Aspects of Hospital Course:  Admitted on 09/01/2022 for aphasia and mutism while at behavioral health Hospital.  Overnight, patient reportedly very tearful and stating that she wanted to die; difficult to redirect  Patient Report:  On assessment today, patient was initially not tracking examiner, not following commands but engages as interview progresses. Mostly nonverbal but does ask "what is the point" of this exam. Reported prior ativan challenge with minimal response. She nods appropriately to questions such as "you came over from the behavioral health hospital" but is unable to provide a reason she was admitted other than "all of this". Seems to think that she was brought to [facility] and medically admitted for the same reason. She is suspicious of examiner.   She doesn't feel good because "All y'all" are bothering her.  She feels like she is being made fun of, so this Probation officer lowers mask, and the patient discontinues making such statements.  When asked about her support, she becomes tearful while talking about her family being taken away  - sounds like they had cut off contact.   Of somatic concerns, she complains of constipation and getting "weaker, weaker, weaker and it doesn't matter."  When asked the last time she pooped, states "how the f# would I know". She often responds to questions by stating "you know that". When asked what could be done to help her, states "stop pretending". States she can't get medicine to help her because it is "way past that" (she refuses to elaborate on this statement).  ROS:  Patient does not respond to many questions about symptomology, but does state that she has lost weight due to food insecurity. She says that she has been crying a lot but that does not mean she wants to end her life. She did endorse previous history of cutting her wrists. She denies AVH.  Collateral information:  From Dr. Edd Arbour at [facility]:  [name] Ramer, patient's son) [name] now lives in [place]. He initially quit his job to help her and moved here to help the patient, but he left [facility] for his mental health and has given up trying to help patient.  According to [name], patient abandoned [place] and [name] (who share the same father) when they were young - "she was selfish and thought only of herself, wanted to do what she wanted to do and no one was gonna tell her otherwise."   Per [name], growing up he heard from his family and relatives that when patient and her husband were 61 or 8 years old, they consumed $500 - $800 of cocaine per week, funding their consumption from stealing from patient's parents. He knows of no other drugs patient was using, and is not aware of any drugs patient abuses at this time.     [name] tells me that patient was "majorly depressed" for most of her life, but did not recall her being psychotic.  Per [name], patient also exhibited self-harm behaviors, such as cutting her wrists.  Patient only started having "psychotic breaks in 2021."  According to [name], patient's first admission was in 2021 where patient was not eating, not taking medicine.  Reportedly, patient went into a [employer] to get cat food without bringing her  wallet and left the store without paying for the items.  Patient "lost it" and rolled around in the middle of the road, prompting the store to call the police.  Since then, patient has been admitted to multiple psychiatric facilities for bizarre behavior (patient texting [name] saying "I killed my parents," locking herself out of her apartment, sleeping outside, peeing in plants or buckets, walking around apartment complex topless, without shoes) and leaves after her treatment being prescribed medications, but picking and choosing which to take.   Per [name], as far as he knows, patient now lives [address], [place], though he does not know how long she can stay there as  she cannot afford it without [name] helping to pay for the rent.  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  [name] tells me "no one can force somebody to eat, drink, or take their meds and I have given up trying to help her." According to Molly Maduro is a meth addict and can't help patient and [name] does not want to be involved.  Psychiatric History:  Information collected from patient, chart  Family psych history: patient denies   Social History:  Previously lived with a roommate; unsure if still with that arrangement.  Son is unsure as to whether patient is working.  Tobacco use: Denies current use Alcohol use: Denies current use Drug use: Reports that she "used to" UDS positive  Family History:   The patient's family history is not on file.  Medical History: Past Medical History:  Diagnosis Date   ADHD (attention deficit hyperactivity disorder)    Anxiety    Depression    Hx of staphylococcal septicemia     Surgical History: Past Surgical History:  Procedure Laterality Date   ABDOMINAL HYSTERECTOMY     BACK SURGERY     left elbow surgery     VIDEO BRONCHOSCOPY N/A 01/27/2013   Procedure: VIDEO BRONCHOSCOPY WITH FLUORO;  Surgeon: Elsie Stain, MD;  Location: [facility];  Service: Cardiopulmonary;  Laterality: N/A;    Medications:   Current Facility-Administered Medications:    0.9 %  sodium chloride infusion, , Intravenous, Continuous, Maxwell, Allee, MD, Last Rate: 75 mL/hr at 09/03/22 0353, Infusion Verify at 09/03/22 0353   clonazePAM (KLONOPIN) tablet 0.5 mg, 0.5 mg, Oral, BID, Aquinnah Devin, MD   enoxaparin (LOVENOX) injection 30 mg, 30 mg, Subcutaneous, Q24H, Maxwell, Allee, MD, 30 mg at 09/02/22 1729   gadopiclenol (VUEWAY) 0.5 MMOL/ML solution 5 mL, 5 mL, Intravenous, Once PRN, Hensel, Jamal Collin, MD   OLANZapine (ZYPREXA) tablet 2.5 mg, 2.5 mg, Oral, Daily **AND** OLANZapine (ZYPREXA) tablet 5  mg, 5 mg, Oral, QHS, Camie Hauss, MD   thiamine (VITAMIN B1) tablet 100 mg, 100 mg, Oral, Daily, Rosezetta Schlatter, MD  Allergies: Allergies  Allergen Reactions   Penicillins Other (See Comments)    Childhood reaction- exact reaction not cited       Objective  Vital signs:  Temp:  [97.4 F (36.3 C)-99.7 F (37.6 C)] 98.7 F (37.1 C) (09/18 1131) Pulse Rate:  [69-88] 69 (09/18 1131) Resp:  [15-20] 16 (09/18 1131) BP: (96-113)/(66-88) 108/68 (09/18 1131) SpO2:  [98 %-100 %] 98 % (09/18 1131)  Psychiatric Specialty Exam:  Presentation  General Appearance: Disheveled (In hospital gown)  Eye Contact:Fair  Speech:Garbled (largely incoherent, especially when crying. Garbled when not crying)  Speech Volume:Decreased  Handedness:Right   Mood and Affect  Mood:Labile  Affect:Congruent; Depressed; Labile; Tearful   Thought Process  Thought Processes:Disorganized  Descriptions of Associations:Circumstantial  Orientation:Partial (self, partially to situation, and somewhat to place (hospital), but refused to answer about time.)  Thought Content:Delusions; Illogical; Paranoid Ideation; Rumination; Scattered  History of Schizophrenia/Schizoaffective disorder:No  Duration of Psychotic Symptoms:Less than six months  Hallucinations:Hallucinations: None (However, frequently referenced people in the room as being unreal)  Ideas of Reference:Paranoia; Percusatory  Suicidal Thoughts:Suicidal Thoughts: -- (Denies this morning, endorsed last night. Stated "just because I'm crying doesn't mean i want to kill myself")  Homicidal Thoughts:Homicidal Thoughts: No   Sensorium  Memory:Immediate Poor; Recent Poor  Judgment:Impaired  Insight:Poor; Lacking   Executive Functions  Concentration:Poor  Attention Span:Poor  Recall:Poor  Fund of Knowledge:Poor  Language:Poor   Psychomotor Activity  Psychomotor Activity:Psychomotor Activity: Normal  Assets   Assets:Other (comment) (Unable to identify assets at this time due to patient's impaired history)   Sleep  Sleep:Sleep: Good   Physical Exam: Physical Exam Vitals reviewed.  Constitutional:      Appearance: She is ill-appearing.  HENT:     Head: Normocephalic.     Mouth/Throat:     Mouth: Mucous membranes are moist.     Pharynx: Oropharynx is clear.     Comments: Poor dentition Pulmonary:     Effort: Pulmonary effort is normal.  Neurological:     Mental Status: She is alert.     Comments: Oriented to person, hospital, and partially to situation; disoriented to time    Review of Systems  Constitutional:  Positive for weight loss.  Respiratory:  Negative for shortness of breath.   Cardiovascular:  Negative for chest pain.  Gastrointestinal:  Positive for abdominal pain and constipation.  Neurological:  Positive for weakness.   Blood pressure 108/68, pulse 69, temperature 98.7 F (37.1 C), temperature source Oral, resp. rate 16, SpO2 98 %. There is no height or weight on file to calculate BMI.

## 2022-09-03 NOTE — Progress Notes (Signed)
Patient out of bed with the sitter's assistance to the bedside commode; she is vomiting into the trash can; undigested ensure drink; she is also c/o constipation and attempted a bm; she had a small hard bm; MD notified of the vomiting and c/o constipation; orders received. Patient returned to bed safety; will f/u on constipation relief after laxative.

## 2022-09-03 NOTE — Evaluation (Signed)
Speech Language Pathology Evaluation Patient Details Name: [name] MRN: [id number] DOB: 01-May-1971 Today's Date: 09/03/2022 Time: 9417-4081 SLP Time Calculation (min) (ACUTE ONLY): 45 min  Problem List:  Patient Active Problem List   Diagnosis Date Noted   Acute alteration in mental status    Affective psychosis, bipolar ([place])    Substance use disorder    Aphasia 09/01/2022   Anxiety 09/01/2022   Protein-calorie malnutrition ([place]) 09/01/2022   Elevated serum hCG 09/01/2022   Cocaine use disorder ([place]) 08/30/2022   Cannabis use disorder 08/30/2022   Panic disorder 03/15/2021   Social anxiety disorder 03/15/2021   Drug psychosis, with delusions ([place]) 11/15/2020   MDD (major depressive disorder), recurrent, severe, with psychosis ([place]) 11/14/2020   Cigarette smoker 02/05/2018   Syncope 02/01/2018   PVC's (premature ventricular contractions)    Chest pain 01/31/2018   GAD (generalized anxiety disorder) 08/16/2016   Pulmonary abscess ([place]) 01/26/2013   Pneumonia, organism unspecified([place]) 01/26/2013   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   Depression 01/23/2013   Attention deficit hyperactivity disorder (ADHD) 01/23/2013   Chronic back pain 01/23/2013   Cavitary lesion of lung 01/22/2013   Intraspinal abscess 11/19/2007   Hx of staphylococcal infection 11/19/2007   Past Medical History:  Past Medical History:  Diagnosis Date   ADHD (attention deficit hyperactivity disorder)    Anxiety    Depression    Hx of staphylococcal septicemia    Past Surgical History:  Past Surgical History:  Procedure Laterality Date   ABDOMINAL HYSTERECTOMY     BACK SURGERY     left elbow surgery     VIDEO BRONCHOSCOPY N/A 01/27/2013   Procedure: VIDEO BRONCHOSCOPY WITH FLUORO;  Surgeon: Elsie Stain, MD;  Location: [facility];  Service: Cardiopulmonary;  Laterality: N/A;   HPI:  51yo female admitted from behavioral health 09/01/22 with acute  aphasia and behavioral change. PMH: bipolar, ADHD, anxiety, depression. MRI negative   Assessment / Plan / Recommendation Clinical Impression  Pt seen at bedside for limited assessment of cognitive linguistic status. Pt was eating lunch, but willing to talk some with SLP. Pt was noted to have poor/missing dentition. Speech difficult to understand due to low volume, rapid rate, and mumbling. When asked for repetition, pt stopped talking altogether. Pt was willing to offer that she lives in an apartment that is "nasty". She mentioned her children, and that her 92 year old daughter "thinks she's an adult". Pt verbalized that she did not think the date on the board was correct, and that we were going to "take it away". Pt frequently responded with "I don't know", or looked away and stopped talking. Pt frequently questioned the purpose of my visit, despite my explanation of why I was there. Dysfluency was noted 2x, early on in the visit, but was not observed again during this session. Pt declined to answer questions from the MMSE, stating "you already know this". Pt's actions and responses during this session appear to be more closely related to behavioral issues and possibly paranoia than true receptive and expressive language deficit. SLP will follow up once more for diagnostic treatment, and hopefully pt will be more willing to participate.    SLP Assessment  SLP Recommendation/Assessment: Patient needs continued Speech Language Pathology Services  SLP Visit Diagnosis: Cognitive communication deficit (R41.841)    Recommendations for follow up therapy are one component of a multi-disciplinary discharge planning process, led by the attending physician.  Recommendations may be updated based on  patient status, additional functional criteria and insurance authorization.    Follow Up Recommendations  Follow physician's recommendations for discharge plan and follow up therapies    Assistance Recommended at  Discharge  Intermittent Supervision/Assistance  Functional Status Assessment Patient has had a recent decline in their functional status and/or demonstrates limited ability to make significant improvements in function in a reasonable and predictable amount of time  Frequency and Duration min 1 x/week  1 week      SLP Evaluation Cognition  Overall Cognitive Status: No family/caregiver present to determine baseline cognitive functioning Arousal/Alertness: Awake/alert       Comprehension  Auditory Comprehension Overall Auditory Comprehension: Appears within functional limits for tasks assessed    Expression Expression Primary Mode of Expression: Verbal Verbal Expression Interfering Components: Speech intelligibility   Oral / Motor  Oral Motor/Sensory Function Overall Oral Motor/Sensory Function: Within functional limits Motor Speech Overall Motor Speech: Impaired Respiration: Within functional limits Phonation: Low vocal intensity Resonance: Within functional limits Articulation: Impaired Level of Impairment: Sentence Intelligibility: Intelligibility reduced Word: 75-100% accurate Phrase: 75-100% accurate Sentence: 50-74% accurate Conversation: 50-74% accurate Motor Planning: Witnin functional limits Interfering Components: Inadequate dentition (rapid rate of speech, low volume mumbling)           Tennelle Taflinger B. Quentin Ore, [facility], [place] Speech Language Pathologist Office: [phone]  Shonna Chock 09/03/2022, 1:16 PM

## 2022-09-03 NOTE — Progress Notes (Signed)
     Daily Progress Note Intern Pager: [phone]  Patient name: Shinita Mac Nemetz Medical record number: [id number] Date of birth: 1971/01/05 Age: 51 y.o. Gender: female  Primary Care Provider: Pcp, No Consultants: psychiatry Code Status: FULL  Pt Overview and Major Events to Date:  9/16: Admitted to [facility]MT[facility]   Assessment and Plan: Cerinity R Plant is a 51 y.o. female who was admitted for acute aphasia and behavioral change from the [facility]. She has a PMHx significant for bipolar disorder, ADD, anxiety.   * Aphasia Patient is medically stable for discharge. Work-up thus far is unremarkable. EEG negative for seizures. MRI was mildly motion degraded, otherwise normal. Patient previously had ativan challenge with minimal response. - Neurology signed off.  - Psychiatry consulted, appreciate care and recommendations - OT/PT/SLP eval and treat  Anxiety On Xanax.  -PDMP reviewed, receives rx for Xanax monthly, last fill in August -Restart Xanax 1 mg BID PRN for anxiety   MDD (major depressive disorder), recurrent, severe, with psychosis ([place]) Previously on Abilify and Neurontin. Per [facility] D/C summary, she was switched to Zyprexa for psychosis at night.  - consult to psych, appreciate recommendations  -continue sitter for patient safety  -Currently under IVC  Protein-calorie malnutrition ([place]) BMI 18. Refusing to eat.  - supplemental nutrition shakes  - continuing on IV maintenance fluids   Elevated serum hCG Patient had elevated Hcg 3 months ago. Likely perimenopausal with questionable relationship to antipsychotics.  -No further work up needed  Attention deficit hyperactivity disorder (ADHD) On dextroamphetamine 20 mg twice daily, last fill in July.  -Hold while inpatient    FEN/GI: regular diet  PPx: lovenox Dispo: [facility]  Barriers include bed availability.  Subjective:  Per sitter, patient occasionally mumbling. Patient laying on her side in bed with fixed gaze  and not speaking to this provider. Sat patient up in bed however patient still refused to engage.   Objective: Temp:  [97.4 F (36.3 C)-99.7 F (37.6 C)] 98.1 F (36.7 C) (09/18 0740) Pulse Rate:  [70-88] 70 (09/18 0740) Resp:  [15-20] 20 (09/18 0740) BP: (96-127)/(66-88) 96/66 (09/18 0740) SpO2:  [98 %-100 %] 99 % (09/18 0740)  Physical Exam: General: Patient with fixed gaze and mute.  Cardiovascular: RRR. No m/r/g. Respiratory: CTAB. Normal WOB on RA. Abdomen: Soft, nontender, nondistended Extremities: No peripheral edema.  Neuro: PERRL. No withdrawal to sternal rub or noxious stimuli.   Laboratory: Most recent CBC Lab Results  Component Value Date   WBC 7.4 09/02/2022   HGB 14.1 09/02/2022   HCT 39.4 09/02/2022   MCV 91.0 09/02/2022   PLT 244 09/02/2022   Most recent BMP    Latest Ref Rng & Units 09/02/2022    5:27 AM  BMP  Glucose 70 - 99 mg/dL 92   BUN 6 - 20 mg/dL 8   Creatinine 0.44 - 1.00 mg/dL 0.51   Sodium 135 - 145 mmol/L 137   Potassium 3.5 - 5.1 mmol/L 4.1   Chloride 98 - 111 mmol/L 104   CO2 22 - 32 mmol/L 24   Calcium 8.9 - 10.3 mg/dL 9.3     Rolanda Lundborg, MD 09/03/2022, 9:19 AM  PGY-1, [place] Intern pager: [phone], text pages welcome Secure chat group [place]

## 2022-09-04 ENCOUNTER — Observation Stay (HOSPITAL_COMMUNITY): Payer: Medicare HMO

## 2022-09-04 DIAGNOSIS — Z780 Asymptomatic menopausal state: Secondary | ICD-10-CM | POA: Diagnosis not present

## 2022-09-04 DIAGNOSIS — Z90710 Acquired absence of both cervix and uterus: Secondary | ICD-10-CM | POA: Diagnosis not present

## 2022-09-04 DIAGNOSIS — F1490 Cocaine use, unspecified, uncomplicated: Secondary | ICD-10-CM

## 2022-09-04 DIAGNOSIS — F39 Unspecified mood [affective] disorder: Secondary | ICD-10-CM

## 2022-09-04 DIAGNOSIS — Z79899 Other long term (current) drug therapy: Secondary | ICD-10-CM | POA: Diagnosis not present

## 2022-09-04 DIAGNOSIS — F411 Generalized anxiety disorder: Secondary | ICD-10-CM | POA: Diagnosis not present

## 2022-09-04 DIAGNOSIS — F3164 Bipolar disorder, current episode mixed, severe, with psychotic features: Secondary | ICD-10-CM

## 2022-09-04 DIAGNOSIS — F333 Major depressive disorder, recurrent, severe with psychotic symptoms: Secondary | ICD-10-CM

## 2022-09-04 DIAGNOSIS — F909 Attention-deficit hyperactivity disorder, unspecified type: Secondary | ICD-10-CM

## 2022-09-04 DIAGNOSIS — F1290 Cannabis use, unspecified, uncomplicated: Secondary | ICD-10-CM

## 2022-09-04 DIAGNOSIS — F419 Anxiety disorder, unspecified: Secondary | ICD-10-CM | POA: Diagnosis present

## 2022-09-04 DIAGNOSIS — R4182 Altered mental status, unspecified: Secondary | ICD-10-CM | POA: Diagnosis present

## 2022-09-04 DIAGNOSIS — R112 Nausea with vomiting, unspecified: Secondary | ICD-10-CM | POA: Diagnosis not present

## 2022-09-04 DIAGNOSIS — F1410 Cocaine abuse, uncomplicated: Secondary | ICD-10-CM | POA: Diagnosis present

## 2022-09-04 DIAGNOSIS — R0602 Shortness of breath: Secondary | ICD-10-CM | POA: Diagnosis not present

## 2022-09-04 DIAGNOSIS — Z20822 Contact with and (suspected) exposure to covid-19: Secondary | ICD-10-CM | POA: Diagnosis present

## 2022-09-04 DIAGNOSIS — E441 Mild protein-calorie malnutrition: Secondary | ICD-10-CM | POA: Diagnosis not present

## 2022-09-04 DIAGNOSIS — Z87891 Personal history of nicotine dependence: Secondary | ICD-10-CM | POA: Diagnosis not present

## 2022-09-04 DIAGNOSIS — E440 Moderate protein-calorie malnutrition: Secondary | ICD-10-CM | POA: Diagnosis present

## 2022-09-04 DIAGNOSIS — K5900 Constipation, unspecified: Secondary | ICD-10-CM

## 2022-09-04 DIAGNOSIS — F1310 Sedative, hypnotic or anxiolytic abuse, uncomplicated: Secondary | ICD-10-CM | POA: Diagnosis present

## 2022-09-04 DIAGNOSIS — F315 Bipolar disorder, current episode depressed, severe, with psychotic features: Secondary | ICD-10-CM | POA: Diagnosis not present

## 2022-09-04 DIAGNOSIS — R4701 Aphasia: Secondary | ICD-10-CM | POA: Diagnosis present

## 2022-09-04 DIAGNOSIS — F1210 Cannabis abuse, uncomplicated: Secondary | ICD-10-CM | POA: Diagnosis present

## 2022-09-04 DIAGNOSIS — Z8673 Personal history of transient ischemic attack (TIA), and cerebral infarction without residual deficits: Secondary | ICD-10-CM | POA: Diagnosis not present

## 2022-09-04 DIAGNOSIS — Z880 Allergy status to penicillin: Secondary | ICD-10-CM | POA: Diagnosis not present

## 2022-09-04 DIAGNOSIS — Z681 Body mass index (BMI) 19 or less, adult: Secondary | ICD-10-CM | POA: Diagnosis not present

## 2022-09-04 MED ORDER — SENNA 8.6 MG PO TABS
1.0000 | ORAL_TABLET | Freq: Every day | ORAL | Status: DC
  Administered 2022-09-04 – 2022-09-05 (×2): 8.6 mg via ORAL
  Filled 2022-09-04 (×2): qty 1

## 2022-09-04 MED ORDER — IOHEXOL 350 MG/ML SOLN
75.0000 mL | Freq: Once | INTRAVENOUS | Status: AC | PRN
  Administered 2022-09-04: 75 mL via INTRAVENOUS

## 2022-09-04 NOTE — Assessment & Plan Note (Signed)
Reported constipation overnight and moderate stool burden on CTAP -started on senna

## 2022-09-04 NOTE — Consult Note (Signed)
[facility] New Face-to-Face Psychiatric Evaluation   Name: [name] DOB: 1971/12/04 MRN: [id number] Service Date: September 04, 2022 LOS:  LOS: 0 days  Reason for Consult: "presented to [facility] from [facility] for aphasia and behavioral changes" Referring Provider: Erskine Emery, MD   Assessment  [name] is a 51 y.o. female admitted medically for 09/01/2022 10:04 AM from [facility] for acute aphasia and mutism. She carries the psychiatric diagnoses of bipolar disorder, GAD ADHD, cannabis use disorder, and cocaine use disorder and has a past medical history of protein calorie malnutrition, elevated serum hCG.   Her current presentation of disorganized thoughts and speech, circumstantial speech, and paranoia is most consistent with psychosis.  At this time, her psychosis may organic vs. secondary to a synthetic marijuana, less likely secondary to a medical cause (patient is not B1 deficient and no ovarian germ cell tumor was found on CT Abd/Pelvis; furthermore, it doesn't seem as though a mediastinal mass is present from prior imaging). She meets criteria for inpatient psychiatric admission once medically cleared based on active psychosis.  Please see plan below for detailed recommendations.   Diagnoses:  Active Hospital problems: Principal Problem:   Aphasia Active Problems:   Attention deficit hyperactivity disorder (ADHD)   MDD (major depressive disorder), recurrent, severe, with psychosis ([place])   Anxiety   Protein-calorie malnutrition (H[facility])   Elevated serum hCG   Acute alteration in mental status   Affective psychosis, bipolar (H[facility])   Substance use disorder   AMS (altered mental status)   Constipation     Plan  ## Safety and Observation Level:  - Based on my clinical evaluation, I estimate the patient to be at moderate-severe risk of self harm in the current setting - At this time, we recommend continued 1:1 level of observation. This decision is based on my review  of the chart including patient's history and current presentation, interview of the patient, mental status examination, and consideration of suicide risk including evaluating suicidal ideation, plan, intent, suicidal or self-harm behaviors, risk factors, and protective factors. This judgment is based on our ability to directly address suicide risk, implement suicide prevention strategies and develop a safety plan while the patient is in the clinical setting. Please contact our team if there is a concern that risk level has changed.   ## Medications:  -- Continue Zyprexa 2.5 mg daily, and 5 mg nightly - Continue Klonopin 0.5 mg twice daily (half dose of home Xanax 1 mg twice daily) - May discontinue empiric thiamine supplementation; Level 119.0 - Consider starting B12 supplementation for borderline level  ## Medical Decision Making Capacity:  Formal decision making capacity was not assessed for any particular decision as part of routine psychiatric evaluation.   ## Further Work-up:  -- CT abdomen and pelvis with contrast with moderate stool burden but otherwise WNL.   -- most recent EKG on 9/16 had QtC of 453 -- Pertinent labwork reviewed earlier this admission includes:  UDS positive for [facility] Serum beta hCG 9.8 (7.7 in December 2022) Total protein 6.2 CBC, A1c, RPR, HIV, ammonia, TSH, folate, urine pregnancy WNL  ## Disposition:  -- At this time, return to inpatient psychiatric hospital once medically cleared  ## Behavioral / Environmental:  -- Recommend continuing 1:1 sitter  ##Legal Status IVC  Thank you for this consult request. Recommendations have been communicated to the primary team.  We will continue to follow at this time.   Rosezetta Schlatter, MD   NEW history  Relevant Aspects of Hospital Course:  Admitted on 09/01/2022 for aphasia and mutism while at behavioral health Hospital.  Overnight, patient vomited undigested Ensure and had a small, hard BM. No behavioral  concerns documented.   Patient Report:  On assessment today, patient remains paranoid and suspicious feeling like hospital staff is out to get her. She has less latency of responses and is mildly less disorganized. However, when asked questions such as whether she is having chest pain, she responds "no one is punching me in the chest today." She continues to endorse nausea and constipation. She denies all other somatic symptoms today. Patient denies SI, HI, and AVH. She continues to ask why we "are doing this to her" without further explanation, then discontinues the conversation.    Collateral information:  No collateral information obtained today.   Psychiatric History:  Information collected from patient, chart  Family psych history: patient denies   Social History:  Previously lived with a roommate; unsure if still with that arrangement.  Son is unsure as to whether patient is working.  Tobacco use: Denies current use Alcohol use: Denies current use Drug use: Reports that she "used to" UDS positive  Family History:   The patient's family history is not on file.  Medical History: Past Medical History:  Diagnosis Date   ADHD (attention deficit hyperactivity disorder)    Anxiety    Depression    Hx of staphylococcal septicemia     Surgical History: Past Surgical History:  Procedure Laterality Date   ABDOMINAL HYSTERECTOMY     BACK SURGERY     left elbow surgery     VIDEO BRONCHOSCOPY N/A 01/27/2013   Procedure: VIDEO BRONCHOSCOPY WITH FLUORO;  Surgeon: Elsie Stain, MD;  Location: [facility];  Service: Cardiopulmonary;  Laterality: N/A;    Medications:   Current Facility-Administered Medications:    clonazePAM (KLONOPIN) tablet 0.5 mg, 0.5 mg, Oral, BID, Belicia Difatta, MD, 0.5 mg at 09/04/22 0841   enoxaparin (LOVENOX) injection 30 mg, 30 mg, Subcutaneous, Q24H, Maxwell, Allee, MD, 30 mg at 09/04/22 1808   gadopiclenol (VUEWAY) 0.5 MMOL/ML solution 5 mL, 5  mL, Intravenous, Once PRN, Hensel, Jamal Collin, MD   OLANZapine (ZYPREXA) tablet 2.5 mg, 2.5 mg, Oral, Daily, 2.5 mg at 09/04/22 0841 **AND** OLANZapine (ZYPREXA) tablet 5 mg, 5 mg, Oral, QHS, Jolin Benavides, MD, 5 mg at 09/03/22 2208   senna (SENOKOT) tablet 8.6 mg, 1 tablet, Oral, Daily, Rolanda Lundborg, MD, 8.6 mg at 09/04/22 1136   thiamine (VITAMIN B1) tablet 100 mg, 100 mg, Oral, Daily, Rosezetta Schlatter, MD, 100 mg at 09/04/22 0841  Allergies: Allergies  Allergen Reactions   Penicillins Other (See Comments)    Childhood reaction- exact reaction not cited       Objective  Vital signs:  Temp:  [97.8 F (36.6 C)-98.9 F (37.2 C)] 98.9 F (37.2 C) (09/19 1912) Pulse Rate:  [73-86] 86 (09/19 1912) Resp:  [13-20] 20 (09/19 1912) BP: (104-119)/(59-80) 116/72 (09/19 1912) SpO2:  [96 %-99 %] 98 % (09/19 1912)  Psychiatric Specialty Exam:  Presentation  General Appearance: Fairly Groomed (Patient freshly showered and combing hair upon entering the room.)  Eye Contact:Fair  Speech:-- (somewhat garbled, more clear than previous day)  Speech Volume:Normal  Handedness:Right   Mood and Affect  Mood:Irritable  Affect:Congruent; Labile   Thought Process  Thought Processes:Disorganized  Descriptions of Associations:Circumstantial (but less)  Orientation:-- (not assessed today)  Thought Content:Illogical; Paranoid Ideation; Rumination  History of Schizophrenia/Schizoaffective disorder:No  Duration  of Psychotic Symptoms:Less than six months  Hallucinations:Hallucinations: None  Ideas of Reference:Paranoia; Percusatory  Suicidal Thoughts:Suicidal Thoughts: No  Homicidal Thoughts:Homicidal Thoughts: No   Sensorium  Memory:Immediate Fair; Recent Fair  Judgment:Impaired  Insight:Poor; Lacking   Executive Functions  Concentration:Poor  Attention Span:Poor  Recall:Poor  Fund of Knowledge:Poor  Language:Poor   Psychomotor Activity  Psychomotor  Activity:Psychomotor Activity: Normal  Assets  Assets:Desire for Improvement   Sleep  Sleep:Sleep: Good   Physical Exam: Physical Exam Vitals reviewed.  Constitutional:      Appearance: She is not ill-appearing.  HENT:     Head: Normocephalic.     Mouth/Throat:     Mouth: Mucous membranes are moist.     Pharynx: Oropharynx is clear.     Comments: Poor dentition Pulmonary:     Effort: Pulmonary effort is normal.  Neurological:     Mental Status: She is alert.    Review of Systems  Constitutional:  Positive for weight loss.  Respiratory:  Negative for shortness of breath.   Cardiovascular:  Negative for chest pain.  Gastrointestinal:  Positive for abdominal pain and constipation.  Neurological:  Positive for weakness.   Blood pressure 116/72, pulse 86, temperature 98.9 F (37.2 C), temperature source Oral, resp. rate 20, SpO2 98 %. There is no height or weight on file to calculate BMI.

## 2022-09-04 NOTE — Progress Notes (Signed)
Occupational Therapy Treatment Patient Details Name: [name] MRN: [id number] DOB: [birth year] Today's Date: 09/04/2022   History of present illness Pt is a 51 y/o F presenting from [facility] with acute aphasia and behavioral changes. MRI negative. PMH includes bipolar disorder, ADD, and anxiety.   OT comments  Pt progressing towards established OT goals. Gathering items out of basin for oral care and washing face. Pt following commands with increased time. Emotionally labile throughout session, and requiring encouragement. Pt requiring min guard A for sit<>stand transfers, grooming, and functional mobility. Will continue to follow acutely to optimize safety and independence in ADL and IADL.    Recommendations for follow up therapy are one component of a multi-disciplinary discharge planning process, led by the attending physician.  Recommendations may be updated based on patient status, additional functional criteria and insurance authorization.    Follow Up Recommendations  Other (comment) (return to [facility]al [facility] hospital)    Assistance Recommended at Discharge Frequent or constant Supervision/Assistance  Patient can return home with the following  A little help with walking and/or transfers;A little help with bathing/dressing/bathroom;Assistance with cooking/housework;Direct supervision/assist for medications management;Direct supervision/assist for financial management;Assist for transportation;Help with stairs or ramp for entrance   Equipment Recommendations  Other (comment) (TBD)    Recommendations for Other Services PT consult    Precautions / Restrictions Precautions Precautions: Fall Precaution Comments: has sitter Restrictions Weight Bearing Restrictions: No       Mobility Bed Mobility Overal bed mobility: Needs Assistance Bed Mobility: Supine to Sit, Sit to Supine     Supine to sit: Min guard Sit to supine: Min guard   General bed mobility comments: min guard  A for safety    Transfers Overall transfer level: Needs assistance Equipment used: Rolling walker (2 wheels) Transfers: Sit to/from Stand Sit to Stand: Min guard           General transfer comment: increased time and cues. Intermittent min A during functional mobility due to inconsistent use of RW     Balance Overall balance assessment: Needs assistance Sitting-balance support: Feet supported Sitting balance-Leahy Scale: Fair     Standing balance support: Bilateral upper extremity supported, During functional activity Standing balance-Leahy Scale: Poor Standing balance comment: Very unsteady without RW. min guard with RW                           ADL either performed or assessed with clinical judgement   ADL Overall ADL's : Needs assistance/impaired     Grooming: Min guard;Standing;Wash/dry face;Oral care Grooming Details (indicate cue type and reason): Consistently leaning forward on sink onto elbows and rising frequently to reach. Had just been seen by PT so min fatigue. Not using toothbrush, but OT did not redirect due to inconsistent mood state and pt frequently reporting she cannot do anything right and feels like everyone has been screaming at her.                 Toilet Transfer: Min guard;Ambulation;Comfort height toilet;Rolling walker (2 wheels) Toilet Transfer Details (indicate cue type and reason): Simulated via functional mobiltiy         Functional mobility during ADLs: Min guard;Rolling walker (2 wheels);Cueing for safety General ADL Comments: Inconsistent use of RW. Observed to furniture surf in room,    Extremity/Trunk Assessment Upper Extremity Assessment Upper Extremity Assessment: Generalized weakness   Lower Extremity Assessment Lower Extremity Assessment: Defer to PT evaluation  Vision   Additional Comments: No assessed. Able to locate grooming items with increased time   Perception     Praxis      Cognition  Arousal/Alertness: Awake/alert Behavior During Therapy: Anxious, Flat affect, Agitated Overall Cognitive Status: No family/caregiver present to determine baseline cognitive functioning                                 General Comments: pt emotionally labile during session, with frequent change in demeanor (crying, angry, thankful). Pt following commands with increased time, increased crying/frustration with any change in initial plan, even when self-initiated. Pt frequently reporting she is unable to have things in room and that even if we use RW not it will get taken from her. Occasionally redirectable, but very unstable, and frequently changing mood state consistent with diagnosis.        Exercises      Shoulder Instructions       General Comments VSS    Pertinent Vitals/ Pain       Pain Assessment Pain Assessment: Faces Faces Pain Scale: Hurts a little bit Pain Location: generalized with movement. Pain Descriptors / Indicators: Discomfort, Constant, Sore Pain Intervention(s): Monitored during session  Home Living                                          Prior Functioning/Environment              Frequency  Min 2X/week        Progress Toward Goals  OT Goals(current goals can now be found in the care plan section)     Acute Rehab OT Goals Patient Stated Goal: exercise OT Goal Formulation: With patient Time For Goal Achievement: 09/16/22 Potential to Achieve Goals: Good ADL Goals Pt Will Perform Grooming: standing;with supervision Pt Will Perform Upper Body Dressing: with supervision;sitting;standing Pt Will Perform Lower Body Dressing: with min guard assist;sitting/lateral leans;sit to/from stand Pt Will Transfer to Toilet: with min guard assist;ambulating;regular height toilet Additional ADL Goal #1: pt will gather 3 items needed for ADL task in order to promote independence with ADLs  Plan Discharge plan remains appropriate     Co-evaluation                 AM-PAC OT "6 Clicks" Daily Activity     Outcome Measure   Help from another person eating meals?: A Little Help from another person taking care of personal grooming?: A Little Help from another person toileting, which includes using toliet, bedpan, or urinal?: A Little Help from another person bathing (including washing, rinsing, drying)?: A Lot Help from another person to put on and taking off regular upper body clothing?: A Little Help from another person to put on and taking off regular lower body clothing?: A Lot 6 Click Score: 16    End of Session Equipment Utilized During Treatment: Gait belt;Rolling walker (2 wheels)  OT Visit Diagnosis: Unsteadiness on feet (R26.81);Other abnormalities of gait and mobility (R26.89);Muscle weakness (generalized) (M62.81);Other symptoms and signs involving cognitive function   Activity Tolerance Patient tolerated treatment well   Patient Left in bed;with call bell/phone within reach;with nursing/sitter in room   Nurse Communication Mobility status        Time: 3354-5625 OT Time Calculation (min): 8 min  Charges: OT General Charges $OT Visit: 1 Visit  OT Treatments $Self Care/Home Management : 8-22 mins  Shanda Howells, OTR/L [facility] Acute Rehabilitation Office: [phone]   Lula Olszewski 09/04/2022, 4:03 PM

## 2022-09-04 NOTE — TOC Initial Note (Signed)
Transition of Care [facility]) - Initial/Assessment Note    Patient Details  Name: [name] MRN: [id number] Date of Birth: 1971-06-12  Transition of Care [facility]) CM/SW Contact:    Geralynn Ochs, LCSW Phone Number: 09/04/2022, 3:45 PM  Clinical Narrative:            CSW updated by MD that patient is medically stable to return to [facility]. CSW attempted to contact disposition earlier, got no response. CSW reached out to psychiatry MD for assistance with returning to [facility], and per MD the patient will not be accepted back to [facility] and needs fax out to other psychiatric facilities. CSW faxed out referral, awaiting responses.        Expected Discharge Plan: Psychiatric Hospital Barriers to Discharge: Continued Medical Work up, Psych Bed not available   Patient Goals and CMS Choice Patient states their goals for this hospitalization and ongoing recovery are:: patient unable to participate in goal setting, not oriented      Expected Discharge Plan and Services Expected Discharge Plan: [facility]       Living arrangements for the past 2 months: Apartment                                      Prior Living Arrangements/Services Living arrangements for the past 2 months: Apartment Lives with:: Self                   Activities of Daily Living      Permission Sought/Granted                  Emotional Assessment   Attitude/Demeanor/Rapport: Unable to Assess Affect (typically observed): Unable to Assess     Psych Involvement: Yes (comment)  Admission diagnosis:  Aphasia [R47.01] Anxiety [F41.9] Polypharmacy [Z79.899] Bipolar disorder, current episode mixed, severe, with psychotic features ([place]) [F31.64] Substance use disorder [F19.90] Acute alteration in mental status [R41.82] AMS (altered mental status) [R41.82] Patient Active Problem List   Diagnosis Date Noted   AMS (altered mental status) 09/04/2022   Constipation 09/04/2022   Acute  alteration in mental status    Affective psychosis, bipolar ([place])    Substance use disorder    Aphasia 09/01/2022   Anxiety 09/01/2022   Protein-calorie malnutrition ([place]) 09/01/2022   Elevated serum hCG 09/01/2022   Cocaine use disorder ([place]) 08/30/2022   Cannabis use disorder 08/30/2022   Panic disorder 03/15/2021   Social anxiety disorder 03/15/2021   Drug psychosis, with delusions ([place]) 11/15/2020   MDD (major depressive disorder), recurrent, severe, with psychosis ([place]) 11/14/2020   Cigarette smoker 02/05/2018   Syncope 02/01/2018   PVC's (premature ventricular contractions)    Chest pain 01/31/2018   GAD (generalized anxiety disorder) 08/16/2016   Pulmonary abscess ([place]) 01/26/2013   Pneumonia, organism unspecified([place]) 01/26/2013   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

## 2022-09-04 NOTE — Progress Notes (Signed)
Daily Progress Note Intern Pager: [phone]  Patient name: [name] Medical record number: [id number] Date of birth: [birth year] Age: 51 y.o. Gender: female  Primary Care Provider: Pcp, No Consultants: psychiatry Code Status: Full  Pt Overview and Major Events to Date:  9/16: Admitted to FMTS   Assessment and Plan: [name] is a 51 y.o. female who was admitted for acute aphasia and behavioral change from the [facility]. She has a PMHx significant for bipolar disorder, ADD, anxiety.   * Aphasia Patient is medically stable for discharge. Work-up thus far is unremarkable. EEG negative for seizures. MRI was mildly motion degraded, otherwise normal. Patient previously had ativan challenge with minimal response.  - Neurology signed off.  - Psychiatry consulted, started on zyprexa and klonopin and thiamine supplementation - OT/PT/SLP eval and treat  Anxiety On Xanax.  -PDMP reviewed, receives rx for Xanax monthly, last fill in August -Restart Xanax 1 mg BID PRN for anxiety   MDD (major depressive disorder), recurrent, severe, with psychosis ([name]) Previously on Abilify and Neurontin. Per [facility] D/C summary, she was switched to Zyprexa for psychosis at night. CTAP to r/o ovarian germ cell tumor was unremarkable.  - consult to psych, appreciate recommendations, started on meds as above -continue sitter for patient safety  -Currently under IVC  Protein-calorie malnutrition ([place]) BMI 18. Eating more now.  - supplemental nutrition shakes  - d/c IV maintenance fluids   Elevated serum hCG Patient had elevated Hcg 3 months ago. Likely perimenopausal with questionable relationship to antipsychotics. CTAP with no intraabdominal pathology or mass and moderate colonic stool burden. -no further w/u needed  Constipation Reported constipation overnight and moderate stool burden on CTAP -started on senna  Attention deficit hyperactivity disorder (ADHD) On  dextroamphetamine 20 mg twice daily, last fill in July.  -Hold while inpatient    FEN/GI: regular diet PPx: lovenox Dispo: [facility] pending bed availability  Subjective:  Nausea and vomiting overnight, given PRN zofran. Also with constipation and had suppository. Per sitter, prior to interview she walked and went to bathroom and then laid back down. She is not reporting any pain. When asked about whether the zofran helped she states "yes but it doesn't mean I won't have nausea again." Asked about pain she reports "stop asking me these stupid questions."   Objective: Temp:  [97.8 F (36.6 C)-98.8 F (37.1 C)] 98.6 F (37 C) (09/19 0448) Pulse Rate:  [68-76] 73 (09/19 0448) Resp:  [13-20] 18 (09/19 0448) BP: (96-115)/(66-80) 115/80 (09/19 0448) SpO2:  [96 %-99 %] 96 % (09/19 0448)  Physical Exam: General: disheveled appearing woman laying in bed in no acute distress Cardiovascular: RRR. No m/r/g. Respiratory: CTAB. Normal WOB on RA.  Abdomen: Soft, non-tender, nondistended Extremities: No peripheral edema  Psych: Paranoid with disorganized thought process.    Laboratory: Most recent CBC Lab Results  Component Value Date   WBC 7.4 09/02/2022   HGB 14.1 09/02/2022   HCT 39.4 09/02/2022   MCV 91.0 09/02/2022   PLT 244 09/02/2022   Most recent BMP    Latest Ref Rng & Units 09/02/2022    5:27 AM  BMP  Glucose 70 - 99 mg/dL 92   BUN 6 - 20 mg/dL 8   Creatinine 0.44 - 1.00 mg/dL 0.51   Sodium 135 - 145 mmol/L 137   Potassium 3.5 - 5.1 mmol/L 4.1   Chloride 98 - 111 mmol/L 104   CO2 22 - 32 mmol/L 24  Calcium 8.9 - 10.3 mg/dL 9.3     Imaging/Diagnostic Tests: CTAP  Radiologist Impression: No acute intra-abdominal pathology identified. No abnormal intra-abdominal mass identified. No definite radiographic explanation for the patient's reported symptoms. 2. Moderate colonic stool burden.    Rolanda Lundborg, MD 09/04/2022, 7:12 AM  PGY-1, [place] Intern pager: [phone], text pages welcome Secure chat group [place]

## 2022-09-04 NOTE — Progress Notes (Signed)
Physical Therapy Treatment Patient Details Name: [name] MRN: [id number] DOB: [birth year] Today's Date: 09/04/2022   History of Present Illness Pt is a 51 y/o F presenting from [facility] with acute aphasia and behavioral changes. MRI negative. PMH includes bipolar disorder, ADD, and anxiety.    PT Comments    Pt progressing today - able to ambulate in hallway with RW and min guard.  Limited due to c/o pain and weakness, as well as emotional liability.  Pt was easily irritated with questions despite intent of therapist to assess pain/mobility and provide suggestions.  Pt had no difficulty verbalizing during session today.    Recommendations for follow up therapy are one component of a multi-disciplinary discharge planning process, led by the attending physician.  Recommendations may be updated based on patient status, additional functional criteria and insurance authorization.  Follow Up Recommendations  No PT follow up     Assistance Recommended at Discharge Intermittent Supervision/Assistance  Patient can return home with the following A little help with walking and/or transfers;A little help with bathing/dressing/bathroom;Assistance with cooking/housework;Help with stairs or ramp for entrance   Equipment Recommendations  Rolling walker (2 wheels)    Recommendations for Other Services       Precautions / Restrictions Precautions Precautions: Fall Precaution Comments: has sitter Restrictions Weight Bearing Restrictions: No     Mobility  Bed Mobility Overal bed mobility: Needs Assistance Bed Mobility: Supine to Sit, Sit to Supine     Supine to sit: Supervision Sit to supine: Supervision        Transfers Overall transfer level: Needs assistance Equipment used: Rolling walker (2 wheels) Transfers: Sit to/from Stand Sit to Stand: Min guard           General transfer comment: Min guard for safety    Ambulation/Gait Ambulation/Gait assistance: Min guard Gait  Distance (Feet): 100 Feet Assistive device: Rolling walker (2 wheels) Gait Pattern/deviations: Step-through pattern, Decreased stride length Gait velocity: decreased     General Gait Details: Pt with c/o pain and weakness - attempted to ask location, cause, etc pain but pt irritated "I'm 100#, your a physical therapist, you should know I hurt."  When pt c/o weakness and slowing gait pattern had her turn around - again pt irritated due to change of plan.  Discussed changed plan b/c she reported pain and weakness and did not want to continue to push too far   Stairs             Wheelchair Mobility    Modified Rankin (Stroke Patients Only)       Balance Overall balance assessment: Needs assistance Sitting-balance support: Feet supported Sitting balance-Leahy Scale: Fair     Standing balance support: Bilateral upper extremity supported, No upper extremity supported Standing balance-Leahy Scale: Fair Standing balance comment: RW to ambulate could lift hands for static stand                            Cognition Arousal/Alertness: Awake/alert Behavior During Therapy: Anxious, Flat affect, Agitated Overall Cognitive Status: No family/caregiver present to determine baseline cognitive functioning                                 General Comments: Pt emotionally labile during session and easily irritated, with frequent change in demeanor (crying, angry, thankful). Pt following commands with increased time, increased crying/frustration with any change in initial plan,  even when self-initiated. Pt frequently reporting she is unable to have things in room and that even if we use RW it will get taken from her. Occasionally redirectable, but very unstable, and frequently changing mood state consistent with diagnosis.        Exercises      General Comments General comments (skin integrity, edema, etc.): VSS      Pertinent Vitals/Pain Pain Assessment Pain  Assessment: Faces Faces Pain Scale: Hurts a little bit Pain Location: generalized with movement. Pain Descriptors / Indicators: Discomfort, Constant, Sore Pain Intervention(s): Limited activity within patient's tolerance, Monitored during session    Home Living                          Prior Function            PT Goals (current goals can now be found in the care plan section) Progress towards PT goals: Progressing toward goals    Frequency    Min 3X/week      PT Plan Current plan remains appropriate    Co-evaluation              AM-PAC PT "6 Clicks" Mobility   Outcome Measure  Help needed turning from your back to your side while in a flat bed without using bedrails?: A Little Help needed moving from lying on your back to sitting on the side of a flat bed without using bedrails?: A Little Help needed moving to and from a bed to a chair (including a wheelchair)?: A Little Help needed standing up from a chair using your arms (e.g., wheelchair or bedside chair)?: A Little Help needed to walk in hospital room?: A Little Help needed climbing 3-5 steps with a railing? : A Little 6 Click Score: 18    End of Session Equipment Utilized During Treatment: Gait belt Activity Tolerance: Other (comment) (limited by emotional lability) Patient left: in bed;with call bell/phone within reach;with nursing/sitter in room;with bed alarm set Nurse Communication: Mobility status PT Visit Diagnosis: Muscle weakness (generalized) (M62.81);Difficulty in walking, not elsewhere classified (R26.2)     Time: 1422-1430 PT Time Calculation (min) (ACUTE ONLY): 8 min  Charges:  $Gait Training: 8-22 mins                     Abran Richard, PT Acute Rehab [facility] [place] 09/04/2022, 4:28 PM

## 2022-09-05 ENCOUNTER — Other Ambulatory Visit: Payer: Self-pay

## 2022-09-05 ENCOUNTER — Encounter (HOSPITAL_COMMUNITY): Payer: Self-pay | Admitting: Psychiatry

## 2022-09-05 ENCOUNTER — Inpatient Hospital Stay (HOSPITAL_COMMUNITY)
Admission: AD | Admit: 2022-09-05 | Discharge: 2022-09-11 | DRG: 885 | Disposition: A | Discharge status: Home / Self Care | Payer: Medicare HMO | Source: Intra-hospital | Attending: Psychiatry | Admitting: Psychiatry

## 2022-09-05 DIAGNOSIS — F333 Major depressive disorder, recurrent, severe with psychotic symptoms: Principal | ICD-10-CM | POA: Diagnosis present

## 2022-09-05 DIAGNOSIS — G4710 Hypersomnia, unspecified: Secondary | ICD-10-CM | POA: Diagnosis present

## 2022-09-05 DIAGNOSIS — E441 Mild protein-calorie malnutrition: Secondary | ICD-10-CM | POA: Diagnosis not present

## 2022-09-05 DIAGNOSIS — Z880 Allergy status to penicillin: Secondary | ICD-10-CM | POA: Diagnosis not present

## 2022-09-05 DIAGNOSIS — F411 Generalized anxiety disorder: Secondary | ICD-10-CM | POA: Diagnosis present

## 2022-09-05 DIAGNOSIS — R4701 Aphasia: Secondary | ICD-10-CM | POA: Diagnosis not present

## 2022-09-05 DIAGNOSIS — Z6820 Body mass index (BMI) 20.0-20.9, adult: Secondary | ICD-10-CM | POA: Diagnosis not present

## 2022-09-05 DIAGNOSIS — E440 Moderate protein-calorie malnutrition: Secondary | ICD-10-CM | POA: Diagnosis present

## 2022-09-05 DIAGNOSIS — F200 Paranoid schizophrenia: Secondary | ICD-10-CM | POA: Diagnosis not present

## 2022-09-05 DIAGNOSIS — F3164 Bipolar disorder, current episode mixed, severe, with psychotic features: Secondary | ICD-10-CM | POA: Diagnosis not present

## 2022-09-05 DIAGNOSIS — R112 Nausea with vomiting, unspecified: Secondary | ICD-10-CM | POA: Diagnosis present

## 2022-09-05 DIAGNOSIS — F4010 Social phobia, unspecified: Secondary | ICD-10-CM | POA: Diagnosis present

## 2022-09-05 DIAGNOSIS — F323 Major depressive disorder, single episode, severe with psychotic features: Secondary | ICD-10-CM | POA: Diagnosis present

## 2022-09-05 DIAGNOSIS — F29 Unspecified psychosis not due to a substance or known physiological condition: Principal | ICD-10-CM | POA: Diagnosis present

## 2022-09-05 DIAGNOSIS — Z87891 Personal history of nicotine dependence: Secondary | ICD-10-CM | POA: Diagnosis not present

## 2022-09-05 DIAGNOSIS — R4182 Altered mental status, unspecified: Secondary | ICD-10-CM | POA: Diagnosis not present

## 2022-09-05 DIAGNOSIS — F909 Attention-deficit hyperactivity disorder, unspecified type: Secondary | ICD-10-CM | POA: Diagnosis present

## 2022-09-05 DIAGNOSIS — K5900 Constipation, unspecified: Secondary | ICD-10-CM | POA: Diagnosis present

## 2022-09-05 DIAGNOSIS — F315 Bipolar disorder, current episode depressed, severe, with psychotic features: Secondary | ICD-10-CM | POA: Diagnosis not present

## 2022-09-05 DIAGNOSIS — Z79899 Other long term (current) drug therapy: Secondary | ICD-10-CM

## 2022-09-05 LAB — SARS CORONAVIRUS 2 BY RT PCR: SARS Coronavirus 2 by RT PCR: NEGATIVE

## 2022-09-05 LAB — SARS CORONAVIRUS 2 (TAT 6-24 HRS): SARS Coronavirus 2: NEGATIVE

## 2022-09-05 LAB — VITAMIN C: Vitamin C: 1.2 mg/dL (ref 0.4–2.0)

## 2022-09-05 MED ORDER — CLONAZEPAM 0.5 MG PO TABS: 0.5000 mg | ORAL_TABLET | Freq: Two times a day (BID) | ORAL | 0 refills | Status: DC

## 2022-09-05 MED ORDER — OLANZAPINE 5 MG PO TABS
5.0000 mg | ORAL_TABLET | Freq: Every day | ORAL | Status: DC
  Administered 2022-09-06 – 2022-09-11 (×6): 5 mg via ORAL
  Filled 2022-09-05 (×9): qty 1

## 2022-09-05 MED ORDER — OLANZAPINE 2.5 MG PO TABS: 10.0000 mg | ORAL_TABLET | Freq: Every day | ORAL | Status: DC

## 2022-09-05 MED ORDER — SENNA 8.6 MG PO TABS: 1.0000 | ORAL_TABLET | Freq: Every day | ORAL | 0 refills | Status: DC

## 2022-09-05 MED ORDER — ACETAMINOPHEN 325 MG PO TABS
650.0000 mg | ORAL_TABLET | Freq: Four times a day (QID) | ORAL | Status: DC | PRN
  Administered 2022-09-08: 650 mg via ORAL
  Filled 2022-09-05: qty 2

## 2022-09-05 MED ORDER — HYDROXYZINE HCL 25 MG PO TABS
25.0000 mg | ORAL_TABLET | Freq: Three times a day (TID) | ORAL | Status: DC | PRN
  Administered 2022-09-05 – 2022-09-09 (×6): 25 mg via ORAL
  Filled 2022-09-05 (×6): qty 1

## 2022-09-05 MED ORDER — OLANZAPINE 2.5 MG PO TABS: 5.0000 mg | ORAL_TABLET | Freq: Every day | ORAL | Status: DC

## 2022-09-05 MED ORDER — OLANZAPINE 5 MG PO TABS: 5.0000 mg | ORAL_TABLET | Freq: Every day | ORAL | Status: DC

## 2022-09-05 MED ORDER — ENSURE ENLIVE PO LIQD
237.0000 mL | Freq: Two times a day (BID) | ORAL | Status: DC
  Filled 2022-09-05 (×3): qty 237

## 2022-09-05 MED ORDER — OLANZAPINE 10 MG PO TABS: 10.0000 mg | ORAL_TABLET | Freq: Every day | ORAL | Status: DC

## 2022-09-05 MED ORDER — ALUM & MAG HYDROXIDE-SIMETH 200-200-20 MG/5ML PO SUSP
30.0000 mL | ORAL | Status: DC | PRN
  Administered 2022-09-10: 30 mL via ORAL
  Filled 2022-09-05: qty 30

## 2022-09-05 MED ORDER — OLANZAPINE 10 MG PO TABS
10.0000 mg | ORAL_TABLET | Freq: Every day | ORAL | Status: DC
  Administered 2022-09-05 – 2022-09-10 (×6): 10 mg via ORAL
  Filled 2022-09-05 (×9): qty 1

## 2022-09-05 MED ORDER — MAGNESIUM HYDROXIDE 400 MG/5ML PO SUSP
30.0000 mL | Freq: Every day | ORAL | Status: DC | PRN
  Administered 2022-09-07 – 2022-09-09 (×2): 30 mL via ORAL
  Filled 2022-09-05 (×2): qty 30

## 2022-09-05 MED ORDER — SENNA 8.6 MG PO TABS
2.0000 | ORAL_TABLET | Freq: Every day | ORAL | Status: DC
  Administered 2022-09-05 – 2022-09-06 (×2): 17.2 mg via ORAL
  Filled 2022-09-05 (×4): qty 2

## 2022-09-05 MED ORDER — ACETAMINOPHEN 325 MG PO TABS
650.0000 mg | ORAL_TABLET | Freq: Once | ORAL | Status: AC
  Administered 2022-09-05: 650 mg via ORAL
  Filled 2022-09-05: qty 2

## 2022-09-05 MED ORDER — CLONAZEPAM 0.5 MG PO TABS
0.5000 mg | ORAL_TABLET | Freq: Two times a day (BID) | ORAL | Status: DC
  Administered 2022-09-05 – 2022-09-11 (×12): 0.5 mg via ORAL
  Filled 2022-09-05 (×12): qty 1

## 2022-09-05 NOTE — Progress Notes (Addendum)
Report given to [facility]. Awaiting on Covid test prior to transportation to disposition.   Patient's belonging given to patient's daughter .

## 2022-09-05 NOTE — Tx Team (Signed)
Initial Treatment Plan 09/05/2022 9:36 PM [name] ZQJ:[id number]    PATIENT STRESSORS: Financial difficulties     PATIENT STRENGTHS: Motivation for treatment/growth  Supportive family/friends    PATIENT IDENTIFIED PROBLEMS: Psychosis  Anxiety  Depression      "Help with right medications"  "Help with right doctors"         DISCHARGE CRITERIA:  Ability to meet basic life and health needs Improved stabilization in mood, thinking, and/or behavior  PRELIMINARY DISCHARGE PLAN: Outpatient therapy Return to previous living arrangement  PATIENT/FAMILY INVOLVEMENT: This treatment plan has been presented to and reviewed with the patient, [name].  The patient and family have been given the opportunity to ask questions and make suggestions.  Maryellen Pile, RN 09/05/2022, 9:36 PM

## 2022-09-05 NOTE — Progress Notes (Signed)
   Initial Admission Note:    [name] is a 51 year old Caucasian female with a history of GAD, social anxiety, who presented to [facility], involuntarily after being medically cleared and transferred from [facility] for inpatient treatment.  Pt reports high anxiety and mild depression.  Pt is calm and cooperative, becoming tearful at times and appears somewhat disorganized. Pt denies SI/HI, and AVH.  Pt contracts for safety with a verbal agreement.  Pt reports her main support as her children, [name] and [name].  Pt reports she retired from her job and moved out of a toxic household.  Pt reports she now lives in an apartment alone.  Pt states she didn't buy food and lost weight, then she ended up here.  Pt reports she becomes anxious to the point of being triggered when she is constantly repeating herself and when she is being asked the same question repeatedly.  Pt states she feels like "no one hears me or takes me seriously and this affects my speech, stumbling over my words."  Pt reports her main stressor as money.  Pt desires help with getting the "right medications and doctors."  On assessment, skin was found to have multiple tattoos. No contraband was found. Patient reported she has a "hurt, burning, sensation in her shoulders and back at times and it is sometimes hard to hold her head up."  Pt also complained of right leg numbness which she relates to her feet being cold.  Pt does display some generalized weakness and walks with care.  Pt was oriented to the unit.  All policies were explained and admission paperwork completed.  Pt was provided nourishment and fluids.  Pt is presently safe on the unit with Q 15-minute safety checks.

## 2022-09-05 NOTE — Progress Notes (Signed)
Patient c/o headache of 5/10 on a pain scale and requested for Tylenol. Notified the on call provider and received Tylenol 650 mg oral  x one dose. Will administer and continue to monitor.

## 2022-09-05 NOTE — Plan of Care (Signed)
  Problem: Education: Goal: Emotional status will improve Outcome: Not Progressing Goal: Mental status will improve Outcome: Not Progressing Goal: Verbalization of understanding the information provided will improve Outcome: Not Progressing   

## 2022-09-05 NOTE — TOC Transition Note (Signed)
Transition of Care [facility]) - CM/SW Discharge Note   Patient Details  Name: [name] MRN: [id number] Date of Birth: 1971-02-19  Transition of Care [facility]) CM/SW Contact:  Geralynn Ochs, LCSW Phone Number: 09/05/2022, 3:09 PM   Clinical Narrative:   CSW alerted that [facility] is able to admit patient today. Patient will go to Bed 505-1. Attending MD Janine Limbo. Transport to be arranged with [employer] after covid test has resulted.  RN TO RN report [id number] MUST BE CALLED BEFORE PT IS TRANSPORTED TO [facility]     Final next level of care: Psychiatric Hospital Barriers to Discharge: Barriers Resolved   Patient Goals and CMS Choice Patient states their goals for this hospitalization and ongoing recovery are:: patient unable to participate in goal setting, not oriented      Discharge Placement                       Discharge Plan and Services                                     Social Determinants of Health (SDOH) Interventions     Readmission Risk Interventions     No data to display

## 2022-09-05 NOTE — Progress Notes (Signed)
Spoke to April at [phone] (After hours Emergency) to transport patient to disposition. Awaiting for transport at this time.

## 2022-09-05 NOTE — Consult Note (Signed)
Brief Psychiatry Consult Note   Date of service: September 05, 2022 Patient Name: [name] DOB: [birth year] MRN: [id number] Reason for consult: "presented to [facility] from [facility] for aphasia and behavioral changes" Referring Provider: Erskine Emery, MD  [name] is a 51 y.o. female admitted medically for 09/01/2022 10:04 AM from [facility] for acute aphasia and mutism. She carries the psychiatric diagnoses of bipolar disorder, GAD ADHD, cannabis use disorder, and cocaine use disorder and has a past medical history of protein calorie malnutrition, elevated serum hCG.   Her current presentation of disorganized thoughts and speech and paranoia is most consistent with psychosis.  At this time, her psychosis may be organic vs. secondary to a synthetic marijuana, less likely secondary to a medical cause (patient is not B1 deficient and no ovarian germ cell tumor was found on CT Abd/Pelvis; furthermore, it doesn't seem as though a mediastinal mass is present from prior imaging). She meets criteria for inpatient psychiatric admission and is medically cleared, per primary team.   ## Medications:  -- Recommend increase to Zyprexa 5 mg qAM, and 10 mg nightly, to begin tonight (patient already received 2.5 mg this AM; total daily increase of 5 mg today). - Continue Klonopin 0.5 mg twice daily (half dose of home Xanax 1 mg twice daily) - Discontinue empiric thiamine supplementation; Level 119.0 - Consider starting B12 supplementation for borderline level  ##Legal Status IVC  Patient reporting today largely unchanged from previous day. She remains paranoid that surveillance cameras are in her room (they are not). She has less latency of responses and is mildly less disorganized. She does complain of chest pain, headache, and constipation initially but later denies. She denies nausea and has not regurgitated her breakfast. Patient does not deny SI, HI, nor AVH, but scoffs and responds "really?" when asked. She  becomes irritable and discontinues the assessment.   Today's Vitals   09/05/22 0031 09/05/22 0351 09/05/22 0541 09/05/22 0745  BP:  (!) 92/58  100/72  Pulse:   69 62  Resp:  16  18  Temp:  98.3 F (36.8 C)  99.1 F (37.3 C)  TempSrc:  Oral  Oral  SpO2:   99% 96%  PainSc: 5       There is no height or weight on file to calculate BMI.   Psychiatric Specialty Exam:   Presentation  General Appearance: Fairly Groomed    Eye Contact:Fair   Speech:-- (somewhat garbled, more clear than previous day)   Speech Volume:Normal   Handedness:Right     Mood and Affect  Mood:Irritable   Affect:Congruent; Labile     Thought Process  Thought Processes:Disorganized   Descriptions of Associations:Circumstantial (but less)   Orientation:-- (not assessed today)   Thought Content:Illogical; Paranoid Ideation; Rumination   History of Schizophrenia/Schizoaffective disorder:No   Duration of Psychotic Symptoms:Less than six months   Hallucinations:Hallucinations: None   Ideas of Reference:Paranoia; Percusatory   Suicidal Thoughts:Suicidal Thoughts: No   Homicidal Thoughts:Homicidal Thoughts: No     Sensorium  Memory:Immediate Fair; Recent Fair   Judgment:Impaired   Insight:Poor; Lacking     Executive Functions  Concentration:Poor   Attention Span:Poor   Recall:Poor   Fund of Knowledge:Poor   Language:Poor     Psychomotor Activity  Psychomotor Activity:Psychomotor Activity: Normal   Assets  Assets:Desire for Improvement     Sleep  Sleep:Sleep: Good   Review of Systems  Constitutional:  Positive for weight loss.  Respiratory:  Negative for shortness of breath.  Cardiovascular:  Negative for chest pain.  Gastrointestinal:  Positive for abdominal pain and constipation.  Neurological:  Positive for weakness and headache.   Signed: Rosezetta Schlatter, MD Psychiatry Resident, PGY-2 [place] 09/05/2022, 11:13 AM

## 2022-09-05 NOTE — Plan of Care (Signed)
  Problem: Education: Goal: Knowledge of General Education information will improve Description: Including pain rating scale, medication(s)/side effects and non-pharmacologic comfort measures Outcome: Adequate for Discharge/back to [facility].

## 2022-09-05 NOTE — Progress Notes (Signed)
     09/05/22 1935  Psych Admission Type (Psych Patients Only)  Admission Status Involuntary  Psychosocial Assessment  Patient Complaints Anxiety;Hopelessness;Insomnia;Panic attack;Anger  Eye Contact Fair  Facial Expression Anxious  Affect Anxious;Sad  Speech Pressured  Interaction Cautious  Motor Activity Slow  Appearance/Hygiene Unremarkable  Behavior Characteristics Cooperative;Anxious  Mood Sad  Thought Process  Coherency Disorganized;Incoherent  Content Blaming others;Blaming self  Delusions Paranoid  Perception WDL  Hallucination None reported or observed  Judgment Poor  Confusion Moderate  Danger to Self  Current suicidal ideation? Denies (Denies)  Agreement Not to Harm Self Yes  Description of Agreement verbal  Danger to Others  Danger to Others None reported or observed

## 2022-09-05 NOTE — Progress Notes (Signed)
Daily Progress Note Intern Pager: [phone]  Patient name: [name] Medical record number: [id number] Date of birth: 1971/10/18 Age: 51 y.o. Gender: female  Primary Care Provider: Pcp, No Consultants: psychiatry Code Status: Full  Pt Overview and Major Events to Date:  9/16: Admitted to FMTS   Assessment and Plan: [name] is a 51 y.o. female who was admitted for acute aphasia and behavioral change from the [facility]. She has a PMHx significant for bipolar disorder, ADD, anxiety.   * Aphasia Patient is medically stable for discharge. Work-up thus far is unremarkable. EEG negative for seizures. MRI was mildly motion degraded, otherwise normal. Patient previously had ativan challenge with minimal response.  - Neurology signed off.  - Psychiatry consulted, started on zyprexa and klonopin and thiamine supplementation - No OT/PT follow up  Anxiety On Xanax.  -PDMP reviewed, receives rx for Xanax monthly, last fill in August -Restart Xanax 1 mg BID PRN for anxiety   MDD (major depressive disorder), recurrent, severe, with psychosis ([place]) Previously on Abilify and Neurontin. Per [facility] D/C summary, she was switched to Zyprexa for psychosis at night. CTAP to r/o ovarian germ cell tumor was unremarkable. HIV, RPR, Ammonia, TSH, vit B12, B1, vitE, vitD unremarkable or within normal limits.   - consult to psych, appreciate recommendations, started on meds as above -continue sitter for patient safety  -Currently under IVC  Protein-calorie malnutrition ([place]) BMI 18. Increased PO intake during admission. - supplemental nutrition shakes  - d/c IV maintenance fluids   Elevated serum hCG Patient had elevated Hcg 3 months ago. Likely perimenopausal with questionable relationship to antipsychotics. CTAP with no intraabdominal pathology or mass and moderate colonic stool burden. -no further w/u needed  Constipation Reported constipation overnight and moderate  stool burden on CTAP -started on senna  Attention deficit hyperactivity disorder (ADHD) On dextroamphetamine 20 mg twice daily, last fill in July.  -Hold while inpatient    FEN/GI: regular diet PPx: lovenox Dispo: psychiatric hospital. Barriers include bed availability.   Subjective:  Reported headache overnight and received tylenol. She denies any pain this AM. Encouraged PO intake, she reports "I am eating." She continues to report that she has not had a bowel movement. Discussed that we added senna yesterday.   Objective: Temp:  [97.8 F (36.6 C)-99.6 F (37.6 C)] 98.3 F (36.8 C) (09/20 0351) Pulse Rate:  [69-86] 69 (09/20 0541) Resp:  [15-20] 16 (09/20 0351) BP: (92-119)/(58-79) 92/58 (09/20 0351) SpO2:  [97 %-99 %] 99 % (09/20 0541)  Physical Exam: General: alert, disheveled appearing woman in no acute distress  Cardiovascular: RRR. No m/r/g. Respiratory: CTAB. Normal WOB on RA.  Abdomen: Soft, non-tender, non-distended.  Extremities: No peripheral edema  Laboratory: Most recent CBC Lab Results  Component Value Date   WBC 7.4 09/02/2022   HGB 14.1 09/02/2022   HCT 39.4 09/02/2022   MCV 91.0 09/02/2022   PLT 244 09/02/2022   Most recent BMP    Latest Ref Rng & Units 09/02/2022    5:27 AM  BMP  Glucose 70 - 99 mg/dL 92   BUN 6 - 20 mg/dL 8   Creatinine 0.44 - 1.00 mg/dL 0.51   Sodium 135 - 145 mmol/L 137   Potassium 3.5 - 5.1 mmol/L 4.1   Chloride 98 - 111 mmol/L 104   CO2 22 - 32 mmol/L 24   Calcium 8.9 - 10.3 mg/dL 9.3     Rolanda Lundborg, MD 09/05/2022, 7:07 AM  PGY-1, [place] Intern pager: [phone], text pages welcome Secure chat group [place]

## 2022-09-05 NOTE — Discharge Summary (Addendum)
[facility] Discharge Summary  Patient name: [name] Medical record number: [id number] Date of birth: 02/15/1971 Age: 51 y.o. Gender: female Date of Admission: 09/01/2022 Date of Discharge: 09/05/22 Admitting Physician: Zenia Resides, MD  Primary Care Provider: Pcp, No Consultants: psychiatry  Indication for Hospitalization: aphasia   Brief Hospital Course:  [name] is a 51 y.o. female presenting with aphasia and behavioral changes. PMHx of bipolar disorder, ADD, anxiety. The patient's presentation was thought to be due to worsening psychiatric disorder. Work up for stroke initiated with MRI pending, CT scan negative for hem and worsening physiatric disorders.    Aphasia Work up for stroke initiated given trouble with word finding and stuttering when attempting to speak. She was waxing and waning and intermittently answering questions. Neurology was consulted. CT scan negative for hemorrhage. MRI unremarkable. EEG no seizures. No further workup indicated.    MDD (major depressive disorder), recurrent, severe, with psychosis  Previously on Abilify and Neurontin. Discharged from [facility] on zyprexa. Psychiatry was consulted and recommended zyprexa, klonopin, and thiamine supplementation. CTAP obtained to rule out ovarian germ cell tumor and was unremarkable. Her aphasia improved with antipsychotic meds.   Benzodiazepine abuse  History of benzo abuse with Xanax. She was put on CIWA and COWS for benzo abuse. She did not exhibit signs of withdrawal during hospital admission. She was restarted on half of home dose of Klonopin.    Protein-calorie malnutrition  BMI 18. Refusing to eat. Supplemental nutrition shakes. Started on IV maintenance fluids and discontinued with improvement in PO intake.    Elevated serum hCG Patient had elevated Hcg 3 months ago. Likely perimenopausal with questionable relationship to antipsychotics.   Discharge  Diagnoses/Problem List:  Principal Problem:   Aphasia Active Problems:   Anxiety   MDD (major depressive disorder), recurrent, severe, with psychosis ([place])   Protein-calorie malnutrition ([facility])   Elevated serum hCG   Attention deficit hyperactivity disorder (ADHD)   Acute alteration in mental status   Affective psychosis, bipolar ([place])   Substance use disorder   AMS (altered mental status)   Constipation  Disposition: [facility] Discharge Condition: stable  Discharge Exam:  General: disheveled appearing woman laying in bed in no acute distress Cardiovascular: RRR. No m/r/g. Respiratory: CTAB. Normal WOB on RA.  Abdomen: Soft, non-tender, nondistended Extremities: No peripheral edema  Psych: Paranoid with disorganized thought process.  No hallucinations. Poor insight. Impaired judgement.   Issues for Follow Up:  Continue to follow-up PO intake   Continue medication per psych recommendations   Significant Procedures: None   Significant Labs and Imaging:  MRI brain:  Mildly motion degraded exam. Normal for age noncontrast MRI appearance of the brain.  CTAP:  1. No acute intra-abdominal pathology identified. No abnormal intra-abdominal mass identified. No definite radiographic explanation for the patient's reported symptoms. 2. Moderate colonic stool burden.  EEG no seizures  Results/Tests Pending at Time of Discharge: None  Discharge Medications:  Allergies as of 09/05/2022       Reactions   Penicillins Other (See Comments)   Childhood reaction- exact reaction not cited        Medication List     TAKE these medications    clonazePAM 0.5 MG tablet Commonly known as: KLONOPIN Take 1 tablet (0.5 mg total) by mouth 2 (two) times daily.   OLANZapine 10 MG tablet Commonly known as: ZYPREXA Take 1 tablet (10 mg total) by mouth at bedtime.   OLANZapine 5 MG tablet Commonly  known as: ZYPREXA Take 1 tablet (5 mg total) by mouth daily. Start taking on: September 06, 2022   senna 8.6 MG Tabs tablet Commonly known as: SENOKOT Take 1 tablet (8.6 mg total) by mouth daily. Start taking on: September 06, 2022       Discharge Instructions: Please refer to Patient Instructions section of EMR for full details.  Patient was counseled important signs and symptoms that should prompt return to medical care, changes in medications, dietary instructions, activity restrictions, and follow up appointments.   Follow-Up Appointments: Discharge to [place], [place], MD 09/05/2022, 11:51 AM PGY-1, [place]

## 2022-09-05 NOTE — Progress Notes (Signed)
Patient expressed to nurse and sitter that she would like for MD to call her daughter for any updates about her care. MD notified.

## 2022-09-06 MED ORDER — ONDANSETRON 4 MG PO TBDP
4.0000 mg | ORAL_TABLET | Freq: Three times a day (TID) | ORAL | Status: DC | PRN
  Administered 2022-09-06: 4 mg via ORAL
  Filled 2022-09-06: qty 1

## 2022-09-06 MED ORDER — GABAPENTIN 100 MG PO CAPS
100.0000 mg | ORAL_CAPSULE | Freq: Three times a day (TID) | ORAL | Status: DC
  Administered 2022-09-06 – 2022-09-08 (×6): 100 mg via ORAL
  Filled 2022-09-06 (×10): qty 1

## 2022-09-06 MED ORDER — ENSURE ENLIVE PO LIQD
237.0000 mL | Freq: Three times a day (TID) | ORAL | Status: DC
  Administered 2022-09-06 – 2022-09-11 (×16): 237 mL via ORAL
  Filled 2022-09-06 (×22): qty 237

## 2022-09-06 MED ORDER — TRAZODONE HCL 50 MG PO TABS
50.0000 mg | ORAL_TABLET | Freq: Every evening | ORAL | Status: DC | PRN
  Administered 2022-09-07 – 2022-09-10 (×4): 50 mg via ORAL
  Filled 2022-09-06 (×5): qty 1

## 2022-09-06 NOTE — Group Note (Signed)
Occupational Therapy Group Note  Group Topic:Coping Skills  Group Date: 09/06/2022 Start Time: 1400 End Time: 1455 Facilitators: Brantley Stage, OT   Group Description: Group encouraged increased engagement and participation through discussion and activity focused on "Coping Ahead." Patients were split up into teams and selected a card from a stack of positive coping strategies. Patients were instructed to act out/charade the coping skill for other peers to guess and receive points for their team. Discussion followed with a focus on identifying additional positive coping strategies and patients shared how they were going to cope ahead over the weekend while continuing hospitalization stay.  Therapeutic Goal(s): Identify positive vs negative coping strategies. Identify coping skills to be used during hospitalization vs coping skills outside of hospital/at home Increase participation in therapeutic group environment and promote engagement in treatment   Participation Level: Active   Participation Quality: Independent   Behavior: Appropriate   Speech/Thought Process: Relevant   Affect/Mood: Appropriate   Insight: Limited   Judgement: Limited   Individualization: pt was active in their participation of group discussion/activity. New skills identified  Modes of Intervention: Discussion and Education  Patient Response to Interventions:  Attentive   Plan: Continue to engage patient in OT groups 2 - 3x/week.  09/06/2022  Brantley Stage, OT Cornell Barman, OT
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
## 2022-09-06 NOTE — Progress Notes (Signed)
The patient verbalized that she had a good day overall since she was "up and active". Her positive event for the day is that she went outside. Her coping skill is to take better care of herself (self care).

## 2022-09-06 NOTE — Group Note (Signed)
Recreation Therapy Group Note   Group Topic:Communication  Group Date: 09/06/2022 Start Time: 3559 End Time: 1035 Facilitators: Victorino Sparrow, LRT,CTRS Location: [address] Dayroom   Goal Area(s) Addresses:  Patient will effectively listen to complete activity.  Patient will identify communication skills used to make activity successful.  Patient will identify how skills used during activity can be used to reach post d/c goals.    Group Description: Geometric Drawings.  Three volunteers from the peer group will be shown an abstract picture with a particular arrangement of geometrical shapes.  Each round, one 'speaker' will describe the pattern, as accurately as possible without revealing the image to the group.  The remaining group members will listen and draw the picture to reflect how it is described to them. Patients with the role of 'listener' cannot ask clarifying questions but, may request that the speaker repeat a direction. Once the drawings are complete, the presenter will show the rest of the group the picture and compare how close each person came to drawing the picture. LRT will facilitate a post-activity discussion regarding effective communication and the importance of planning, listening, and asking for clarification in daily interactions with others.   Affect/Mood: Appropriate   Participation Level: Engaged   Participation Quality: Independent   Behavior: Appropriate   Speech/Thought Process: Focused   Insight: Moderate   Judgement: Moderate   Modes of Intervention: Activity   Patient Response to Interventions:  Engaged   Education Outcome:  Acknowledges education and In group clarification offered    Clinical Observations/Individualized Feedback: Pt was attentive and appropriate.  Pt engaged in the activity and would answer questions when asked.  Pt expressed not being confident in her responses to the questions.  Pt was encouraged to go with her gut feeling  in answering the questions and that the purpose was for everybody to learn. Pt seemed pleased with that response.     Plan: Continue to engage patient in RT group sessions 2-3x/week.   Victorino Sparrow, Glennis Brink 09/06/2022 12:33 PM
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
## 2022-09-06 NOTE — H&P (Addendum)
Psychiatric Adult Admission Assessment   Patient Identification: [name] MRN:  [id number] Date of Evaluation:  09/06/2022 Chief Complaint:  Psychosis [facility]) [F29] Principal Diagnosis: Psychosis ([place]) Diagnosis:  Principal Problem:   Psychosis ([place])     CC: Anxiety History of Present Illness:  [name] is a 51 y.o. female with psychiatric hx of bipolar disorder, GAD ADHD, cannabis use disorder, sedative use disorder, and cocaine use disorder who returns to [facility] after being medically cleared from acute aphasia and mutism for continued psychosis.    Admitted to [facility] involuntarily initially on 08/30/22 for bizarre behavior, multiple somatic complaints, potential seizure-like activity.  On evaluation at that time, patient was severely disorganized, paranoid that her dentures were stolen/pulled, persecutory delusions.  Was diagnosed with MDD with psychosis, GAD, social anxiety, and cannabis use disorder. Was started on gabapentin and abilify for psychiatric stabilization. Was started on ativan for concern of catatonia and zyprexa for psychosis.  Patient was then transferred to the medical floor due to concerns of catatonia as well as aphasia.  EEG, MRI did not elicit any acute neurological findings.  Psychiatry was consulted while patient was on the medical floor and patient was started on Zyprexa and Klonopin with thiamine supplementation.  Patient was given IV fluids as well as supplemental nutritional shakes for nutritional support.  Patient's BMI now 20.6, up from 18.   Patient reports feeling severe anxiety that has persisted for several years now.  Patient reports multiple stressors related to ensuring financial stability, family stressors, hypervigilance related to history of traumas.  Patient feels concerned that children are trying to take all of her money but appears to be able to better reality test this and thinks that this is not likely.  Patient reports history of panic attacks  predominantly related to elevated anxiety.   Patient has history of depression but states that these were brief although does mention how there have been times where she feels like wanting to stay in bed for the whole day but no longer than this.  Per chart review, patient has had history of depressed episodes but no psychotic symptoms until 2021.  Patient currently and is endorsing hopelessness, helplessness, depressed mood, increased appetite, hypersomnia, poor energy, poor concentration, anhedonia.  Patient does offer some degree of insight and that she admits her poor eating habits may be contributing to her mental health.  Patient denies SI/HI.   Patient denies manic/hypomanic symptoms.  Patient was very guarded, irritable and somewhat circumstantial at beginning of assessment but appears to improve as the assessment continued.  Patient denies present auditory/visual hallucinations, paranoia, ideas of reference.  Patient explains that she does not feel that people are coming after her nor does she experience what she feels are hallucinations; however, patient does report feeling hypervigilant and uncomfortable at times especially when she is not in a familiar place.   Patient has limited recollection as to the exact reason why she was admitted to behavioral health hospital beyond being "crazy" on 08/30/22.  Patient does endorse having concerns about her severe anxiety as well as inability to cope with all of her financial and family stressors.   Patient appears to have some difficulty with current nutritional intake as she reports having a few episodes of nausea/vomiting since coming to behavioral health hospital.  Discussed with her that we would add Zofran to manage this and encourage nutritional intake as able.  Patient verbalized understanding and was amenable to this part of the plan.   Patient's  primary concerns are having anxiety appropriately addressed as well as resolution of the persistent  nausea.  Patient initially reported not caring what types of medications we are giving her but became more interested as I explained each one of them in their uses.  Discussed that having these medications was essential to her mental health and wellbeing and patient appeared to be more receptive to it.  Patient reports that she is trying to tolerate p.o. intake and nutritional support is much as possible but does state that it can be difficult at times.  Patient denies dysphagia, gastric reflux, purging behaviors, or anxiety that leads to her vomiting.  Patient reports never having problems with vomiting prior to hospitalization.  Patient was amenable to starting gabapentin 100 mg 3 times daily to better manage anxiety as well as starting trazodone as needed for sleep given her difficulty sleeping in unfamiliar environment.   Collateral: [name] (daughter) [phone] Attempted to obtain collateral x2 but no one answered.   Past Psychiatric Hx: Previous Psych Diagnoses: MDD, recurrent episode, severe with psychosis, GAD, social anxiety Prior inpatient treatment: endorses at [facility] Current/prior outpatient treatment:zyprexa, klonopin, hydroxyzine, trazodone Psychotherapy EZ:MOQHUT History of suicide:parasuicidal behavior but no suicide attempts History of homicide: denies Psychiatric medication history:endorses Psychiatric medication compliance history:compliant while here Neuromodulation history: denies Current Psychiatrist:none Current therapist: none   Substance Abuse Hx: Alcohol: denies Tobacco:denies Illicit drugs:cannabis Rx drug abuse:adderall, xanax Rehab ML:[facility]   Past Medical History: Medical Diagnoses:      Past Medical History:  Diagnosis Date   ADHD (attention deficit hyperactivity disorder)     Anxiety     Depression     Hx of staphylococcal septicemia      Allergies:      Allergies  Allergen Reactions   Penicillins Other (See Comments)      Childhood  reaction- exact reaction not cited    PCP: Pcp, No     Social History: Abuse: family Marital Status: widowed, husband died from substance use Children: 3 children, ages 30, 66, and 29 Employment: trying to go back to work, 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 Housing: lives by self with cats Finances: through work Scientist, research (physical sciences): denies Nature conservation officer: denies Guns: denies Medication stockpile: denies   Risk to self: minimal Risk to others: denies   Lab Results:  Lab Results Last 48 Hours        Results for orders placed or performed during the hospital encounter of 09/01/22 (from the past 48 hour(s))  SARS CORONAVIRUS 2 (TAT 6-24 HRS) Anterior Nasal Swab     Status: None    Collection Time: 09/05/22 12:15 PM    Specimen: Anterior Nasal Swab  Result Value Ref Range    SARS Coronavirus 2 NEGATIVE NEGATIVE      Comment: (NOTE) SARS-CoV-2 target nucleic acids are NOT DETECTED.   The SARS-CoV-2 RNA is generally detectable in upper and lower respiratory specimens during the acute phase of infection. Negative results do not preclude SARS-CoV-2 infection, do not rule out co-infections with other pathogens, and should not be used as the sole basis for treatment or other patient management decisions. Negative results must be combined with clinical observations, patient history, and epidemiological information. The expected result is Negative.   Fact Sheet for Patients: SugarRoll.be   Fact Sheet for Healthcare Providers: https://www.woods-mathews.com/   This test is not yet approved or cleared by the [place] FDA and  has been authorized for detection and/or diagnosis of  SARS-CoV-2 by FDA under an Emergency Use Authorization (EUA). This EUA will remain  in effect (meaning this test can be used) for the duration of the COVID-19 declaration under Se ction 564(b)(1) of the Act, 21 U.S.C. section 360bbb-3(b)(1), unless the  authorization is terminated or revoked sooner.   Performed at [facility], [place] [address], [place], [place] [address]    SARS Coronavirus 2 by RT PCR (hospital order, performed in [facility] hospital lab) *cepheid single result test* Anterior Nasal Swab     Status: None    Collection Time: 09/05/22  3:49 PM    Specimen: Anterior Nasal Swab  Result Value Ref Range    SARS Coronavirus 2 by RT PCR NEGATIVE NEGATIVE      Comment: (NOTE) SARS-CoV-2 target nucleic acids are NOT DETECTED.   The SARS-CoV-2 RNA is generally detectable in upper and lower respiratory specimens during the acute phase of infection. The lowest concentration of SARS-CoV-2 viral copies this assay can detect is 250 copies / mL. A negative result does not preclude SARS-CoV-2 infection and should not be used as the sole basis for treatment or other patient management decisions.  A negative result may occur with improper specimen collection / handling, submission of specimen other than nasopharyngeal swab, presence of viral mutation(s) within the areas targeted by this assay, and inadequate number of viral copies (<250 copies / mL). A negative result must be combined with clinical observations, patient history, and epidemiological information.   Fact Sheet for Patients:   https://www.patel.info/   Fact Sheet for Healthcare Providers: https://hall.com/   This test is not yet approved or  cleared by the [place] FDA and has been authorized for detection and/or diagnosis of SARS-CoV-2 by FDA under an Emergency Use Authorization (EUA).  This EUA will remain in effect (meaning this test can be used) for the duration of the COVID-19 declaration under Section 564(b)(1) of the Act, 21 U.S.C. section 360bbb-3(b)(1), unless the authorization is terminated or revoked sooner.   Performed at [facility], [place] [address], [place],  [address]           Blood Alcohol level:  Recent Labs       Lab Results  Component Value Date    ETH <10 08/26/2022    ETH <10 41/58/3094        Metabolic Disorder Labs:  Recent Labs       Lab Results  Component Value Date    HGBA1C 5.0 09/02/2022    MPG 96.8 09/02/2022    MPG 96.8 08/08/2022      Recent Labs       Lab Results  Component Value Date    PROLACTIN 19.2 11/13/2020      Recent Labs       Lab Results  Component Value Date    CHOL 180 09/02/2022    TRIG 38 09/02/2022    HDL 68 09/02/2022    CHOLHDL 2.6 09/02/2022    VLDL 8 09/02/2022    LDLCALC 104 (H) 09/02/2022    LDLCALC 133 (H) 08/08/2022        Musculoskeletal: Strength & Muscle Tone: wnl Gait & Station: n/a   Psychiatric Specialty Exam: Presentation  General Appearance: Fairly Groomed   Eye Contact:Fair   Speech:Normal Rate   Speech Volume:Normal     Mood and Affect  Mood:Irritable; Anxious   Affect:Labile     Thought Process  Thought Processes:Disorganized   Descriptions of Associations:Circumstantial   Orientation:Full (Time,  Place and Person)   Thought Content:Rumination   History of Schizophrenia/Schizoaffective disorder:No   Duration of Psychotic Symptoms:Less than six months   Hallucinations:Hallucinations: None   Ideas of Reference:Percusatory   Suicidal Thoughts:Suicidal Thoughts: No   Homicidal Thoughts:Homicidal Thoughts: No     Sensorium  Memory:Immediate Fair; Recent Fair   Judgment:Fair   Insight:Fair     Executive Functions  Concentration:Fair   Attention Span:Fair   [place]     Psychomotor Activity  Psychomotor Activity:Psychomotor Activity: Decreased   Assets  Assets:Desire for Improvement; Resilience; Social Support     Sleep  Sleep:Sleep: Good   Physical Exam: Physical Exam Vitals and nursing note reviewed.  Constitutional:      Appearance: Normal appearance. She is normal weight.  HENT:      Head: Normocephalic and atraumatic.  Pulmonary:     Effort: Pulmonary effort is normal.  Neurological:     General: No focal deficit present.     Mental Status: She is oriented to person, place, and time.      Review of Systems  Constitutional:  Positive for malaise/fatigue.  Respiratory:  Negative for shortness of breath.   Cardiovascular:  Negative for chest pain.  Gastrointestinal:  N/V. Negative for abdominal pain, constipation, diarrhea, heartburn Neurological:  Positive for weakness. Negative for headaches.    Blood pressure 109/84, pulse (!) 102, temperature 98.2 F (36.8 C), temperature source Oral, resp. rate 20, height '5\' 4"'$  (1.626 m), weight 54.4 kg, SpO2 100 %. Body mass index is 20.6 kg/m.   Treatment Plan Summary: Safety and Monitoring: INVOLUTARILY (bizarre behavior) admission to inpatient psychiatric unit for safety, stabilization and treatment Daily contact with patient to assess and evaluate symptoms and progress in treatment Appropriate medication management to further stabilize patient Patient's case will be regularly discussed in multi-disciplinary team meeting Observation Level : q15 minute checks Vital signs: q12 hours Precautions: suicide, elopement, and assault   2. Psychiatric Problems MDD recurrent severe with psychosis (r/o SIMD, r/o substance induced psychosis, r/o acute benzodiazepines withdrawal delirium) GAD by hx Social anxiety d/o by hx -Continue Zyprexa 5 mg in the morning and 10 mg at night for psychosis -Continue Klonopin 0.5 mg twice daily for severe anxiety and benzodiazepine misuse -Restart gabapentin 100 mg 3 times daily for anxiety and neuropathic pain -- The risks/benefits/side-effects/alternatives to gabapentin were discussed in detail with the patient and time was given for questions. The patient consents to medication trial.  -- Metabolic profile and EKG monitoring obtained while on an atypical antipsychotic (BMI: 20.60 Lipid  Panel: LDL cholesterol 104, HbgA1c: 5.0 QTc: 457) -- Encouraged patient to participate in unit milieu and in scheduled group therapies    3. Medical Management Nausea/vomiting Patient states that this is due to medications but appears to be more consistent with severe anxiety and food intake. Poor historian regarding this. -Zofran PRN -Monitor as needed   Protein-calorie malnutrition  -Increase ensure to TID for additional nutritional support -Encourage PO intake -Will get BMP in 2 days to assess electrolyte -daily weights   PRN The following PRN medications were added to ensure patient can focus on treatment. These were discussed with patient and patient aware of ability to ask for the following medications:  -Tylenol 650 mg q6hr PRN for mild pain -Mylanta 30 ml suspension for indigestion -Milk of Magnesia 30 ml for constipation -Trazodone 50 mg qhs for insomnia -Hydroxyzine 25 mg tid PRN for anxiety   4. Discharge Planning Patient  will require the following based on my assessment:  Greatly appreciate CSW and Case management assistance with facilitating these needs and any further recommendations regarding patient's needs upon discharge. Estimated LOS: 5 days Discharge Concerns: Need to establish a safety plan; Medication compliance and effectiveness Discharge Goals: Return home with outpatient referrals for mental health follow-up including medication management/psychotherapy     Long Term Goal(s): Minimizing disruption current psychiatric diagnosis is causing so that patient can be safely discharged Short Term Goals: Compliance with proposed treatment plan and adjusting to psychiatric unit and peers.   I certify that inpatient services furnished can reasonably be expected to improve the patient's condition.       France Ravens, MD PGY2 Psychiatry Resident 09/06/2022 12:13 PM

## 2022-09-06 NOTE — [facility] Suicide Risk Assessment (Signed)
[place] INPATIENT:  Family/Significant Other Suicide Prevention Education  Suicide Prevention Education:  Contact Attempts: [name] (daughter)  [phone] has been identified by the patient as the family member/significant other with whom the patient will be residing, and identified as the person(s) who will aid the patient in the event of a mental health crisis.  With written consent from the patient, two attempts were made to provide suicide prevention education, prior to and/or following the patient's discharge.  We were unsuccessful in providing suicide prevention education.  A suicide education pamphlet was given to the patient to share with family/significant other.  Date and time of first attempt:09/06/2022 1:55 PM  Date and time of second attempt: CSW team will make additional attempt to reach [name] (daughter)  [place] 09/06/2022, 1:55 PM

## 2022-09-06 NOTE — Progress Notes (Signed)
Recreation Therapy Notes  INPATIENT RECREATION THERAPY ASSESSMENT  Patient Details Name: [name] MRN: [id number] DOB: Dec 31, 1970 Today's Date: 09/06/2022       Information Obtained From: Patient  Able to Participate in Assessment/Interview: Yes  Patient Presentation:  (Tearful)  Reason for Admission (Per Patient): Other (Comments) (Anxiety, Depression)  Patient Stressors:  (Anxiety, Panic Disorder)  Coping Skills:   Isolation, TV, Music, Exercise, Deep Breathing, Meditate, Talk, Prayer, Avoidance, Read, Hot Bath/Shower  Leisure Interests (2+):  Social - Family, Individual - Other (Comment) (Take care of her animals; Lacinda Axon for self and others)  Frequency of Recreation/Participation: Other (Comment) (Daily)  Awareness of Community Resources:  Yes  Community Resources:  [place], Patent examiner, Engineer, drilling  Current Use: Yes  If no, Barriers?:    Expressed Interest in [place]: No  [employer] of Residence:  Investment banker, corporate  Patient Main Form of Transportation: Musician  Patient Strengths:  "I don't know"  Patient Identified Areas of Improvement:  "I can tell but I'll just start studdering"  Patient Goal for Hospitalization:  "get panic attacks and anxiety under control"  Current SI (including self-harm):  No  Current HI:  No  Current AVH: No  Staff Intervention Plan: Group Attendance, Collaborate with Interdisciplinary Treatment Team  Consent to Intern Participation: N/A   Victorino Sparrow, Vickki Muff, Lashawnda Hancox A 09/06/2022, 12:52 PM

## 2022-09-06 NOTE — Progress Notes (Signed)
Pt denies SI, HI, AVH and pain this shift. Reports she slept fair last night with fair appetite "Well, I vomited earlier this morning. I feel sick on my stomach. I'm scared of eating now, I don't know what's going on". Vitals done, HR slightly elevated. Fluids encouraged, tolerated well. Pt started on Zofran 4 mg PO PRN  Q 8 hours, first dose given at 1138 with desired effect. Support, encouragement and reassurance provided to pt. Safety checks maintained at Q 15 minutes intervals without outburst. Pt attended scheduled groups with prompts. Tolerated meals well without further emesis thus far.

## 2022-09-06 NOTE — [facility] Suicide Risk Assessment (Deleted)
Psychiatric Adult Admission Assessment  Patient Identification: [name] MRN:  [id number] Date of Evaluation:  09/06/2022 Chief Complaint:  Psychosis [facility]) [F29] Principal Diagnosis: Psychosis ([place]) Diagnosis:  Principal Problem:   Psychosis ([place])   CC: Anxiety History of Present Illness:  [name] is a 51 y.o. female with psychiatric hx of bipolar disorder, GAD ADHD, cannabis use disorder, sedative use disorder, and cocaine use disorder who returns to [facility] after being medically cleared from acute aphasia and mutism for continued psychosis.   Admitted to [facility] involuntarily initially on 08/30/22 for bizarre behavior, multiple somatic complaints, potential seizure-like activity.  On evaluation at that time, patient was severely disorganized, paranoid that her dentures were stolen/pulled, persecutory delusions.  Was diagnosed with MDD with psychosis, GAD, social anxiety, and cannabis use disorder. Was started on gabapentin and abilify for psychiatric stabilization. Was started on ativan for concern of catatonia and zyprexa for psychosis.  Patient was then transferred to the medical floor due to concerns of catatonia as well as aphasia.  EEG, MRI did not elicit any acute neurological findings.  Psychiatry was consulted while patient was on the medical floor and patient was started on Zyprexa and Klonopin with thiamine supplementation.  Patient was given IV fluids as well as supplemental nutritional shakes for nutritional support.  Patient's BMI now 20.6, up from 18.  Patient reports feeling severe anxiety that has persisted for several years now.  Patient reports multiple stressors related to ensuring financial stability, family stressors, hypervigilance related to history of traumas.  Patient feels concerned that children are trying to take all of her money but appears to be able to better reality test this and thinks that this is not likely.  Patient reports history of panic attacks  predominantly related to elevated anxiety.  Patient has history of depression but states that these were brief although does mention how there have been times where she feels like wanting to stay in bed for the whole day but no longer than this.  Per chart review, patient has had history of depressed episodes but no psychotic symptoms until 2021.  Patient currently and is endorsing hopelessness, helplessness, depressed mood, increased appetite, hypersomnia, poor energy, poor concentration, anhedonia.  Patient does offer some degree of insight and that she admits her poor eating habits may be contributing to her mental health.  Patient denies SI/HI.  Patient denies manic/hypomanic symptoms.  Patient was very guarded, irritable and somewhat circumstantial at beginning of assessment but appears to improve as the assessment continued.  Patient denies present auditory/visual hallucinations, paranoia, ideas of reference.  Patient explains that she does not feel that people are coming after her nor does she experience what she feels are hallucinations; however, patient does report feeling hypervigilant and uncomfortable at times especially when she is not in a familiar place.  Patient has limited recollection as to the exact reason why she was admitted to behavioral health hospital beyond being "crazy" on 08/30/22.  Patient does endorse having concerns about her severe anxiety as well as inability to cope with all of her financial and family stressors.  Patient appears to have some difficulty with current nutritional intake as she reports having a few episodes of nausea/vomiting since coming to behavioral health hospital.  Discussed with her that we would add Zofran to manage this and encourage nutritional intake as able.  Patient verbalized understanding and was amenable to this part of the plan.  Patient's primary concerns are having anxiety appropriately addressed as well as  resolution of the persistent nausea.   Patient initially reported not caring what types of medications we are giving her but became more interested as I explained each one of them in their uses.  Discussed that having these medications was essential to her mental health and wellbeing and patient appeared to be more receptive to it.  Patient reports that she is trying to tolerate p.o. intake and nutritional support is much as possible but does state that it can be difficult at times.  Patient denies dysphagia, gastric reflux, purging behaviors, or anxiety that leads to her vomiting.  Patient reports never having problems with vomiting prior to hospitalization.  Patient was amenable to starting gabapentin 100 mg 3 times daily to better manage anxiety as well as starting trazodone as needed for sleep given her difficulty sleeping in unfamiliar environment.  Collateral: [name] (daughter) [phone] Attempted to obtain collateral x2 but no one answered.  Past Psychiatric Hx: Previous Psych Diagnoses: MDD, recurrent episode, severe with psychosis, GAD, social anxiety Prior inpatient treatment: endorses at [facility] Current/prior outpatient treatment:zyprexa, klonopin, hydroxyzine, trazodone Psychotherapy BS:[facility] History of suicide:parasuicidal behavior but no suicide attempts History of homicide: denies Psychiatric medication history:endorses Psychiatric medication compliance history:compliant while here Neuromodulation history: denies Current Psychiatrist:none Current therapist: none  Substance Abuse Hx: Alcohol: denies Tobacco:denies Illicit drugs:cannabis Rx drug abuse:adderall, xanax Rehab FM:[id number]  Past Medical History: Medical Diagnoses:  Past Medical History:  Diagnosis Date   ADHD (attention deficit hyperactivity disorder)    Anxiety    Depression    Hx of staphylococcal septicemia    Allergies: Allergies  Allergen Reactions   Penicillins Other (See Comments)    Childhood reaction- exact reaction not  cited   PCP: Pcp, No   Social History: Abuse: family Marital Status: widowed, husband died from substance use Children: 3 children, ages 72, 35, and 16 Employment: trying to go back to work, 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 Housing: lives by self with cats Finances: through work Scientist, research (physical sciences): denies Nature conservation officer: denies Guns: denies Medication stockpile: denies  Risk to self: minimal Risk to others: denies  Lab Results:  Results for orders placed or performed during the hospital encounter of 09/01/22 (from the past 48 hour(s))  SARS CORONAVIRUS 2 (TAT 6-24 HRS) Anterior Nasal Swab     Status: None   Collection Time: 09/05/22 12:15 PM   Specimen: Anterior Nasal Swab  Result Value Ref Range   SARS Coronavirus 2 NEGATIVE NEGATIVE    Comment: (NOTE) SARS-CoV-2 target nucleic acids are NOT DETECTED.  The SARS-CoV-2 RNA is generally detectable in upper and lower respiratory specimens during the acute phase of infection. Negative results do not preclude SARS-CoV-2 infection, do not rule out co-infections with other pathogens, and should not be used as the sole basis for treatment or other patient management decisions. Negative results must be combined with clinical observations, patient history, and epidemiological information. The expected result is Negative.  Fact Sheet for Patients: SugarRoll.be  Fact Sheet for Healthcare Providers: https://www.woods-mathews.com/  This test is not yet approved or cleared by the [place] FDA and  has been authorized for detection and/or diagnosis of SARS-CoV-2 by FDA under an Emergency Use Authorization (EUA). This EUA will remain  in effect (meaning this test can be used) for the duration of the COVID-19 declaration under Se ction 564(b)(1) of the Act, 21 U.S.C. section 360bbb-3(b)(1), unless the authorization is terminated or revoked sooner.  Performed at [facility]  Lab,  [address], [place], [place] [address]   SARS Coronavirus 2 by RT PCR (hospital order, performed in [facility] hospital lab) *cepheid single result test* Anterior Nasal Swab     Status: None   Collection Time: 09/05/22  3:49 PM   Specimen: Anterior Nasal Swab  Result Value Ref Range   SARS Coronavirus 2 by RT PCR NEGATIVE NEGATIVE    Comment: (NOTE) SARS-CoV-2 target nucleic acids are NOT DETECTED.  The SARS-CoV-2 RNA is generally detectable in upper and lower respiratory specimens during the acute phase of infection. The lowest concentration of SARS-CoV-2 viral copies this assay can detect is 250 copies / mL. A negative result does not preclude SARS-CoV-2 infection and should not be used as the sole basis for treatment or other patient management decisions.  A negative result may occur with improper specimen collection / handling, submission of specimen other than nasopharyngeal swab, presence of viral mutation(s) within the areas targeted by this assay, and inadequate number of viral copies (<250 copies / mL). A negative result must be combined with clinical observations, patient history, and epidemiological information.  Fact Sheet for Patients:   https://www.patel.info/  Fact Sheet for Healthcare Providers: https://hall.com/  This test is not yet approved or  cleared by the [place] FDA and has been authorized for detection and/or diagnosis of SARS-CoV-2 by FDA under an Emergency Use Authorization (EUA).  This EUA will remain in effect (meaning this test can be used) for the duration of the COVID-19 declaration under Section 564(b)(1) of the Act, 21 U.S.C. section 360bbb-3(b)(1), unless the authorization is terminated or revoked sooner.  Performed at [facility], [place] [address], [place], [place] [address]     Blood Alcohol level:  Lab Results  Component Value Date   ETH <10 08/26/2022   ETH <10 68/34/1962     Metabolic Disorder Labs:  Lab Results  Component Value Date   HGBA1C 5.0 09/02/2022   MPG 96.8 09/02/2022   MPG 96.8 08/08/2022   Lab Results  Component Value Date   PROLACTIN 19.2 11/13/2020   Lab Results  Component Value Date   CHOL 180 09/02/2022   TRIG 38 09/02/2022   HDL 68 09/02/2022   CHOLHDL 2.6 09/02/2022   VLDL 8 09/02/2022   LDLCALC 104 (H) 09/02/2022   LDLCALC 133 (H) 08/08/2022    Musculoskeletal: Strength & Muscle Tone: wnl Gait & Station: n/a  Psychiatric Specialty Exam: Presentation  General Appearance: Fairly Groomed  Eye Contact:Fair  Speech:Normal Rate  Speech Volume:Normal   Mood and Affect  Mood:Irritable; Anxious  Affect:Labile   Thought Process  Thought Processes:Disorganized  Descriptions of Associations:Circumstantial  Orientation:Full (Time, Place and Person)  Thought Content:Rumination  History of Schizophrenia/Schizoaffective disorder:No  Duration of Psychotic Symptoms:Less than six months  Hallucinations:Hallucinations: None  Ideas of Reference:Percusatory  Suicidal Thoughts:Suicidal Thoughts: No  Homicidal Thoughts:Homicidal Thoughts: No   Sensorium  Memory:Immediate Fair; Recent Fair  Judgment:Fair  Insight:Fair   Executive Functions  Concentration:Fair  Attention Span:Fair  [place]   Psychomotor Activity  Psychomotor Activity:Psychomotor Activity: Decreased  Assets  Assets:Desire for Improvement; Resilience; Social Support   Sleep  Sleep:Sleep: Good  Physical Exam: Physical Exam Vitals and nursing note reviewed.  Constitutional:      Appearance: Normal appearance. She is normal weight.  HENT:     Head: Normocephalic and atraumatic.  Pulmonary:     Effort: Pulmonary effort is normal.  Neurological:     General: No focal  deficit present.     Mental Status: She is oriented to person, place, and time.    Review of Systems   Constitutional:  Positive for malaise/fatigue.  Respiratory:  Negative for shortness of breath.   Cardiovascular:  Negative for chest pain.  Gastrointestinal:  Negative for abdominal pain, constipation, diarrhea, heartburn, nausea and vomiting.  Neurological:  Positive for weakness. Negative for headaches.   Blood pressure 109/84, pulse (!) 102, temperature 98.2 F (36.8 C), temperature source Oral, resp. rate 20, height '5\' 4"'$  (1.626 m), weight 54.4 kg, SpO2 100 %. Body mass index is 20.6 kg/m.  Treatment Plan Summary: Safety and Monitoring: INVOLUTARILY (bizarre behavior) admission to inpatient psychiatric unit for safety, stabilization and treatment Daily contact with patient to assess and evaluate symptoms and progress in treatment Appropriate medication management to further stabilize patient Patient's case will be regularly discussed in multi-disciplinary team meeting Observation Level : q15 minute checks Vital signs: q12 hours Precautions: suicide, elopement, and assault  2. Psychiatric Problems MDD recurrent severe with psychosis (r/o SIMD, r/o substance induced psychosis, r/o acute benzodiazepines withdrawal delirium) GAD by hx Social anxiety d/o by hx -Continue Zyprexa 5 mg in the morning and 10 mg at night for psychosis -Continue Klonopin 0.5 mg twice daily for severe anxiety and benzodiazepine misuse -Restart gabapentin 100 mg 3 times daily for anxiety and neuropathic pain -- The risks/benefits/side-effects/alternatives to gabapentin were discussed in detail with the patient and time was given for questions. The patient consents to medication trial.  -- Metabolic profile and EKG monitoring obtained while on an atypical antipsychotic (BMI: 20.60 Lipid Panel: LDL cholesterol 104, HbgA1c: 5.0 QTc: 457) -- Encouraged patient to participate in unit milieu and in scheduled group therapies   3. Medical Management Nausea/vomiting Patient states that this is due to medications  but appears to be more consistent with severe anxiety and food intake. Poor historian regarding this. -Zofran PRN -Monitor as needed  Protein-calorie malnutrition  -Increase ensure to TID for additional nutritional support -Encourage PO intake -Will get BMP in 2 days to assess electrolyte -daily weights  PRN The following PRN medications were added to ensure patient can focus on treatment. These were discussed with patient and patient aware of ability to ask for the following medications:  -Tylenol 650 mg q6hr PRN for mild pain -Mylanta 30 ml suspension for indigestion -Milk of Magnesia 30 ml for constipation -Trazodone 50 mg qhs for insomnia -Hydroxyzine 25 mg tid PRN for anxiety  4. Discharge Planning Patient will require the following based on my assessment:  Greatly appreciate CSW and Case management assistance with facilitating these needs and any further recommendations regarding patient's needs upon discharge. Estimated LOS: 5 days Discharge Concerns: Need to establish a safety plan; Medication compliance and effectiveness Discharge Goals: Return home with outpatient referrals for mental health follow-up including medication management/psychotherapy   Long Term Goal(s): Minimizing disruption current psychiatric diagnosis is causing so that patient can be safely discharged Short Term Goals: Compliance with proposed treatment plan and adjusting to psychiatric unit and peers.  I certify that inpatient services furnished can reasonably be expected to improve the patient's condition.     France Ravens, MD PGY2 Psychiatry Resident 09/06/2022 12:13 PM

## 2022-09-06 NOTE — Progress Notes (Signed)

## 2022-09-06 NOTE — Progress Notes (Signed)
   09/06/22 2030  Psych Admission Type (Psych Patients Only)  Admission Status Involuntary  Psychosocial Assessment  Patient Complaints Anxiety  Eye Contact Fair  Facial Expression Anxious  Affect Appropriate to circumstance  Speech Soft  Interaction Cautious  Motor Activity Slow  Appearance/Hygiene Unremarkable  Behavior Characteristics Appropriate to situation;Cooperative  Mood Anxious;Pleasant  Thought Process  Content WDL  Delusions None reported or observed  Perception WDL  Hallucination None reported or observed  Judgment Poor  Confusion WDL  Danger to Self  Current suicidal ideation? Denies  Danger to Others  Danger to Others None reported or observed   Progress note   D: Pt seen in the dayroom. Pt denies SI, HI, AVH. Pt rates pain  0/10. Pt rates anxiety  6/10 and depression  0/10. Pt says she played basketball today. She said her appetite is good and she is drinking more fluids. Pt states she is still feeling constipated. She feels bloated. Pt being given medication for this. Discussed medication and its method of action with patient. Pt denies the nausea that she had this morning. Pt says she is sleeping well and attending groups. Pt anxious because she is afraid she will miss her bedtime. "I usually go to bed at 9 and I get nervous and shaky about missing my bed time."  A: Pt provided support and encouragement. Pt given scheduled medication as prescribed. PRNs as appropriate. Q15 min checks for safety.   R: Pt safe on the unit. Will continue to monitor.

## 2022-09-07 ENCOUNTER — Encounter (HOSPITAL_COMMUNITY): Payer: Self-pay

## 2022-09-07 MED ORDER — SENNA 8.6 MG PO TABS
2.0000 | ORAL_TABLET | Freq: Every day | ORAL | Status: DC
  Administered 2022-09-08 – 2022-09-11 (×4): 17.2 mg via ORAL
  Filled 2022-09-07 (×6): qty 2

## 2022-09-07 MED ORDER — POLYETHYLENE GLYCOL 3350 17 G PO PACK
17.0000 g | PACK | Freq: Every day | ORAL | Status: DC
  Administered 2022-09-07 – 2022-09-10 (×4): 17 g via ORAL
  Filled 2022-09-07 (×5): qty 1

## 2022-09-07 NOTE — Progress Notes (Signed)
   09/07/22 2000  Psych Admission Type (Psych Patients Only)  Admission Status Involuntary  Psychosocial Assessment  Patient Complaints Anxiety;Nervousness  Eye Contact Fair  Facial Expression Anxious  Affect Appropriate to circumstance  Speech Logical/coherent  Interaction Cautious;Assertive  Motor Activity Slow  Appearance/Hygiene Unremarkable  Behavior Characteristics Cooperative;Appropriate to situation;Anxious  Mood Anxious;Pleasant  Thought Process  Coherency WDL  Content WDL  Delusions None reported or observed  Perception WDL  Hallucination None reported or observed  Judgment Poor  Confusion None  Danger to Self  Current suicidal ideation? Denies  Danger to Others  Danger to Others None reported or observed   Progress note   D: Pt seen in dayroom. Pt denies SI, HI, AVH. Pt rates pain  0/10. Pt rates anxiety  7/10 and depression  0/10. Pt states that she had a bowel movement today. "It wasn't great but it was a start. I can still feel like something else needs to come out. But, I went so long without having one, I know it will take some time." Pt still c/o some bloating but no cramping or pain. Pt says that she was concerned that other patients thought she was standoffish because she doesn't smile. "I asked my daughter to bring my teeth and my glasses so I can read, but she has other things going on and didn't bring them to me." Pt denies visual issues when watching television. Pt did leave group today because she was having trouble sitting in the chair for a prolonged period. "After my surgery on my tailbone I can't sit on hard chairs. People probably thought I didn't want to stay in group but I couldn't get comfortable." Pt using a pillow to sit now. Pt states she is often anxious and fidgety in the evening. Pt encouraged to use PRN medications if needed. Pt has been attending groups and eating and drinking well. Did wake up last night but was able to return to sleep. No other  complaints noted at this time.  A: Pt provided support and encouragement. Pt given scheduled medication as prescribed. PRNs as appropriate. Q15 min checks for safety.   R: Pt safe on the unit. Will continue to monitor.

## 2022-09-07 NOTE — Progress Notes (Addendum)
[facility] MD Progress Note  09/07/2022 1:14 PM [name]  MRN:  [id number] Principal Problem: Psychosis [facility]) Diagnosis: Principal Problem:   Psychosis ([place])   Reason for Admission: Paranoia, anxiety, poor self-care  Subjective:  Patient seen and assessed at bedside. Reports anxiety is better. Continues to deny SI/HI/AVH, paranoia, ideas of reference. Denies depressive symptoms. Denies side effect from initiation of gabapentin. Reports some abdominal pain and constipation today but no nausea/vomiting since Zofran yesterday. Agreeable to starting miralax scheduled to help with bowel movement. Discussed we would attempt to obtain collateral again today for safety planning and discharge goals.  Objective:  Chart Review Past 24 hours of patient's chart was reviewed.  Patient is compliant with scheduled meds. Required Agitation PRNs: none Per RN notes, no documented behavioral issues and is  attending group.  Total Time spent with patient: 45 minutes  Past Psychiatric History:  Previous Psych Diagnoses: MDD, recurrent episode, severe with psychosis, GAD, social anxiety Prior inpatient treatment: endorses at [facility] Current/prior outpatient treatment:zyprexa, klonopin, hydroxyzine, trazodone Psychotherapy MV:[facility] History of suicide:parasuicidal behavior but no suicide attempts History of homicide: denies Psychiatric medication history:endorses Psychiatric medication compliance history:compliant while here Neuromodulation history: denies Current Psychiatrist:none Current therapist: none   Past Medical History:  Past Medical History:  Diagnosis Date   ADHD (attention deficit hyperactivity disorder)    Anxiety    Depression    Hx of staphylococcal septicemia     Past Surgical History:  Procedure Laterality Date   ABDOMINAL HYSTERECTOMY     BACK SURGERY     left elbow surgery     VIDEO BRONCHOSCOPY N/A 01/27/2013   Procedure: VIDEO BRONCHOSCOPY WITH FLUORO;  Surgeon:  Elsie Stain, MD;  Location: [place];  Service: Cardiopulmonary;  Laterality: N/A;   Family History: History reviewed. No pertinent family history. Family Psychiatric  History: denies Social History:  Social History   Substance and Sexual Activity  Alcohol Use Not Currently     Social History   Substance and Sexual Activity  Drug Use Not Currently   Types: Marijuana    Social History   Socioeconomic History   Marital status: Widowed    Spouse name: Not on file   Number of children: Not on file   Years of education: Not on file   Highest education level: Not on file  Occupational History   Not on file  Tobacco Use   Smoking status: Former    Packs/day: 1.00    Years: 20.00    Total pack years: 20.00    Types: Cigarettes   Smokeless tobacco: Never  Vaping Use   Vaping Use: Never used  Substance and Sexual Activity   Alcohol use: Not Currently   Drug use: Not Currently    Types: Marijuana   Sexual activity: Not on file  Other Topics Concern   Not on file  Social History Narrative   Not on file   Social Determinants of Health   Financial Resource Strain: Not on file  Food Insecurity: Unknown (09/05/2022)   Hunger Vital Sign    Worried About Running Out of Food in the Last Year: Patient refused    [place] in the Last Year: Patient refused  Transportation Needs: Unknown (09/05/2022)   PRAPARE - Hydrologist (Medical): Patient refused    Lack of Transportation (Non-Medical): Patient refused  Physical Activity: Not on file  Stress: Not on file  Social Connections: Not on file   Additional  Social History:                         Current Medications: Current Facility-Administered Medications  Medication Dose Route Frequency Provider Last Rate Last Admin   acetaminophen (TYLENOL) tablet 650 mg  650 mg Oral Q6H PRN Rolanda Lundborg, MD       alum & mag hydroxide-simeth (MAALOX/MYLANTA) 200-200-20 MG/5ML  suspension 30 mL  30 mL Oral Q4H PRN Rolanda Lundborg, MD       clonazePAM Bobbye [place]) tablet 0.5 mg  0.5 mg Oral BID Rolanda Lundborg, MD   0.5 mg at 09/07/22 8469   feeding supplement (ENSURE ENLIVE / ENSURE PLUS) liquid 237 mL  237 mL Oral TID BM France Ravens, MD   237 mL at 09/07/22 6295   gabapentin (NEURONTIN) capsule 100 mg  100 mg Oral TID France Ravens, MD   100 mg at 09/07/22 1247   hydrOXYzine (ATARAX) tablet 25 mg  25 mg Oral TID PRN Rolanda Lundborg, MD   25 mg at 09/07/22 1134   magnesium hydroxide (MILK OF MAGNESIA) suspension 30 mL  30 mL Oral Daily PRN Rolanda Lundborg, MD   30 mL at 09/07/22 0809   OLANZapine (ZYPREXA) tablet 5 mg  5 mg Oral Daily Rolanda Lundborg, MD   5 mg at 09/07/22 2841   And   OLANZapine (ZYPREXA) tablet 10 mg  10 mg Oral QHS Rolanda Lundborg, MD   10 mg at 09/06/22 2044   ondansetron (ZOFRAN-ODT) disintegrating tablet 4 mg  4 mg Oral Q8H PRN France Ravens, MD   4 mg at 09/06/22 1138   polyethylene glycol (MIRALAX / GLYCOLAX) packet 17 g  17 g Oral Daily France Ravens, MD   17 g at 09/07/22 0945   [START ON 09/08/2022] senna (SENOKOT) tablet 17.2 mg  2 tablet Oral Daily France Ravens, MD       traZODone (DESYREL) tablet 50 mg  50 mg Oral QHS PRN France Ravens, MD        Lab Results:  No results found for this or any previous visit (from the past 24 hour(s)).  Blood Alcohol level:  Lab Results  Component Value Date   ETH <10 08/26/2022   ETH <10 32/44/0102    Metabolic Disorder Labs: Lab Results  Component Value Date   HGBA1C 5.0 09/02/2022   MPG 96.8 09/02/2022   MPG 96.8 08/08/2022   Lab Results  Component Value Date   PROLACTIN 19.2 11/13/2020   Lab Results  Component Value Date   CHOL 180 09/02/2022   TRIG 38 09/02/2022   HDL 68 09/02/2022   CHOLHDL 2.6 09/02/2022   VLDL 8 09/02/2022   LDLCALC 104 (H) 09/02/2022   LDLCALC 133 (H) 08/08/2022    Physical Findings: AIMS: 0, no cogwheeling rigidity, no abnormal facial  movements  Musculoskeletal: Strength & Muscle Tone: within normal limits Gait & Station: normal  Psychiatric Specialty Exam:  Presentation  General Appearance: Fairly Groomed   Eye Contact:Fair   Speech:Normal Rate   Speech Volume:Normal   Handedness:Right    Mood and Affect  Mood:Irritable; Anxious   Affect:Labile    Thought Process  Thought Processes:Disorganized   Descriptions of Associations:Circumstantial   Orientation:Full (Time, Place and Person)   Thought Content:Rumination   History of Schizophrenia/Schizoaffective disorder:No   Duration of Psychotic Symptoms:Less than six months   Hallucinations:Hallucinations: None  Ideas of Reference:Percusatory   Suicidal Thoughts:Suicidal Thoughts: No  Homicidal Thoughts:Homicidal Thoughts: No   Sensorium  Memory:Immediate Fair; Recent Fair   Judgment:Fair   Insight:Fair    Executive Functions  Concentration:Fair   Attention Span:Fair   [place]    Psychomotor Activity  Psychomotor Activity:Psychomotor Activity: Decreased   Assets  Assets:Desire for Improvement; Resilience; Social Support    Sleep  Sleep:Sleep: Good    Physical Exam: Review of Systems  Respiratory:  Negative for shortness of breath.   Cardiovascular:  Negative for chest pain.  Gastrointestinal:  Positive for abdominal pain and constipation. Negative for diarrhea, heartburn, nausea and vomiting.  Neurological:  Negative for headaches.   Blood pressure (!) 125/94, pulse (!) 105, temperature (!) 97.4 F (36.3 C), temperature source Oral, resp. rate 20, height '5\' 4"'$  (1.626 m), weight 54.1 kg, SpO2 98 %. Body mass index is 20.46 kg/m.   ASSESSMENT AND PLAN Jordis R Knoedler is a 51 y.o. female with psychiatric hx of bipolar disorder, GAD ADHD, cannabis use disorder, sedative use disorder, and cocaine use disorder who [facility] after being  medically cleared from acute aphasia and mutism for continued psychosis.   PLAN Safety and Monitoring: Involuntary admission to inpatient psychiatric unit for safety, stabilization and treatment Daily contact with patient to assess and evaluate symptoms and progress in treatment Patient's case to be discussed in multi-disciplinary team meeting Observation Level : q15 minute checks Vital signs: q12 hours Precautions: suicide, elopement, and assault   Psychiatric Problems MDD recurrent severe with psychosis (r/o SIMD, r/o substance induced psychosis, r/o acute benzodiazepines withdrawal delirium) GAD by hx Social anxiety d/o by hx -Continue Zyprexa 5 mg in the morning and 10 mg at night for psychosis -Continue Klonopin 0.5 mg twice daily for severe anxiety and benzodiazepine misuse -Continue gabapentin 100 mg 3 times daily for anxiety and neuropathic pain -- Metabolic profile and EKG monitoring obtained while on an atypical antipsychotic (BMI: 20.60 Lipid Panel: LDL cholesterol 104, HbgA1c: 5.0 QTc: 457) -- Encouraged patient to participate in unit milieu and in scheduled group therapies    3. Medical Management Constipation Nausea/vomiting Patient states that this is due to medications but appears to be more consistent with severe anxiety and food intake. Poor historian regarding this. -Zofran PRN -Miralax+Senokot scheduled -Monitor as needed   Protein-calorie malnutrition  -Increase ensure to TID for additional nutritional support -Encourage PO intake -Will get BMP in 2 days to assess electrolyte -daily weights   PRN The following PRN medications were added to ensure patient can focus on treatment. These were discussed with patient and patient aware of ability to ask for the following medications:  -Tylenol 650 mg q6hr PRN for mild pain -Mylanta 30 ml suspension for indigestion -Milk of Magnesia 30 ml for constipation -Trazodone 50 mg qhs for insomnia -Hydroxyzine 25 mg tid  PRN for anxiety   4. Discharge Planning Patient will require the following based on my assessment:  Greatly appreciate CSW and Case management assistance with facilitating these needs and any further recommendations regarding patient's needs upon discharge. Estimated LOS: 5 days Discharge Concerns: Need to establish a safety plan; Medication compliance and effectiveness Discharge Goals: Return home with outpatient referrals for mental health follow-up including medication management/psychotherapy   France Ravens, MD 09/07/2022, 1:14 PM

## 2022-09-07 NOTE — Progress Notes (Signed)
Pt presents restless / fidgety with fair eye contact, cautious but forwards on interactions with slow but steady gait. Pt is logical with soft speech on interactions. PRN MOM given for C/O constipation along with scheduled Miralax 17 mg PO with result "I had a BM, a good one". Reports decrease in anxiety level with PRN Vistaril 25 mg when reassessed 1230. Emotional support, encouragement and reassurance provided to pt. Safety checks maintained at Q 15 minutes intervals without outburst thus far. Pt tolerates meals and fluids well without emesis this shift. Denies concerns at this time.

## 2022-09-07 NOTE — Group Note (Signed)
LCSW Group Therapy Note   Group Date: 09/07/2022 Start Time: 1300 End Time: 1400   Type of Therapy and Topic:  Group Therapy: Boundaries  Participation Level:  Active  Description of Group: This group will address the use of boundaries in their personal lives. Patients will explore why boundaries are important, the difference between healthy and unhealthy boundaries, and negative and postive outcomes of different boundaries and will look at how boundaries can be crossed.  Patients will be encouraged to identify current boundaries in their own lives and identify what kind of boundary is being set. Facilitators will guide patients in utilizing problem-solving interventions to address and correct types boundaries being used and to address when no boundary is being used. Understanding and applying boundaries will be explored and addressed for obtaining and maintaining a balanced life. Patients will be encouraged to explore ways to assertively make their boundaries and needs known to significant others in their lives, using other group members and facilitator for role play, support, and feedback.  Therapeutic Goals:  1.  Patient will identify areas in their life where setting clear boundaries could be  used to improve their life.  2.  Patient will identify signs/triggers that a boundary is not being respected. 3.  Patient will identify two ways to set boundaries in order to achieve balance in  their lives: 4.  Patient will demonstrate ability to communicate their needs and set boundaries  through discussion and/or role plays  Summary of Patient Progress:  [name] was present/active throughout the session and proved open to feedback from [name] and peers. Patient demonstrated fair insight into the subject matter, was respectful of peers, and was present throughout the entire session. Patient was irritable throughout group.   Therapeutic Modalities:   Cognitive Behavioral Therapy Solution-Focused  Therapy  Zachery Conch, LCSW 09/07/2022  3:03 PM

## 2022-09-07 NOTE — Progress Notes (Signed)
Pt states that she is still having trouble having a bowel movement. Pt took her Senokot last evening. Pt says abdomen starting to hurt. Will pass this information on to day shift RN.

## 2022-09-07 NOTE — BH IP Treatment Plan (Signed)
Interdisciplinary Treatment and Diagnostic Plan Update  09/07/2022 Time of Session: 1000 [name] MRN: [id number]  Principal Diagnosis: Psychosis (H[facility])  Secondary Diagnoses: Principal Problem:   Psychosis (H[facility])   Current Medications:  Current Facility-Administered Medications  Medication Dose Route Frequency Provider Last Rate Last Admin   acetaminophen (TYLENOL) tablet 650 mg  650 mg Oral Q6H PRN Chien, Stephanie, MD       alum & mag hydroxide-simeth (MAALOX/MYLANTA) 200-200-20 MG/5ML suspension 30 mL  30 mL Oral Q4H PRN Chien, Stephanie, MD       clonazePAM (KLONOPIN) tablet 0.5 mg  0.5 mg Oral BID Chien, Stephanie, MD   0.5 mg at 09/07/22 0808   feeding supplement (ENSURE ENLIVE / ENSURE PLUS) liquid 237 mL  237 mL Oral TID BM Ji, Andrew, MD   237 mL at 09/07/22 0923   gabapentin (NEURONTIN) capsule 100 mg  100 mg Oral TID Ji, Andrew, MD   100 mg at 09/07/22 0807   hydrOXYzine (ATARAX) tablet 25 mg  25 mg Oral TID PRN Chien, Stephanie, MD   25 mg at 09/07/22 1134   magnesium hydroxide (MILK OF MAGNESIA) suspension 30 mL  30 mL Oral Daily PRN Chien, Stephanie, MD   30 mL at 09/07/22 0809   OLANZapine (ZYPREXA) tablet 5 mg  5 mg Oral Daily Chien, Stephanie, MD   5 mg at 09/07/22 0807   And   OLANZapine (ZYPREXA) tablet 10 mg  10 mg Oral QHS Chien, Stephanie, MD   10 mg at 09/06/22 2044   ondansetron (ZOFRAN-ODT) disintegrating tablet 4 mg  4 mg Oral Q8H PRN Ji, Andrew, MD   4 mg at 09/06/22 1138   polyethylene glycol (MIRALAX / GLYCOLAX) packet 17 g  17 g Oral Daily Ji, Andrew, MD   17 g at 09/07/22 0945   [START ON 09/08/2022] senna (SENOKOT) tablet 17.2 mg  2 tablet Oral Daily Ji, Andrew, MD       traZODone (DESYREL) tablet 50 mg  50 mg Oral QHS PRN Ji, Andrew, MD       PTA Medications: Medications Prior to Admission  Medication Sig Dispense Refill Last Dose   clonazePAM (KLONOPIN) 0.5 MG tablet Take 1 tablet (0.5 mg total) by mouth 2 (two) times daily. 30 tablet 0     OLANZapine (ZYPREXA) 10 MG tablet Take 1 tablet (10 mg total) by mouth at bedtime.      OLANZapine (ZYPREXA) 5 MG tablet Take 1 tablet (5 mg total) by mouth daily.      senna (SENOKOT) 8.6 MG TABS tablet Take 1 tablet (8.6 mg total) by mouth daily. 120 tablet 0     Patient Stressors: Financial difficulties    Patient Strengths: Motivation for treatment/growth  Supportive family/friends   Treatment Modalities: Medication Management, Group therapy, Case management,  1 to 1 session with clinician, Psychoeducation, Recreational therapy.   Physician Treatment Plan for Primary Diagnosis: Psychosis (HCC) Long Term Goal(s):     Short Term Goals:    Medication Management: Evaluate patient's response, side effects, and tolerance of medication regimen.  Therapeutic Interventions: 1 to 1 sessions, Unit Group sessions and Medication administration.  Evaluation of Outcomes: Not Met  Physician Treatment Plan for Secondary Diagnosis: Principal Problem:   Psychosis (HCC)  Long Term Goal(s):     Short Term Goals:       Medication Management: Evaluate patient's response, side effects, and tolerance of medication regimen.  Therapeutic Interventions: 1 to 1 sessions, Unit Group sessions and Medication administration.    Evaluation of Outcomes: Not Met   RN Treatment Plan for Primary Diagnosis: Psychosis (HCC) Long Term Goal(s): Knowledge of disease and therapeutic regimen to maintain health will improve  Short Term Goals: Ability to remain free from injury will improve and Compliance with prescribed medications will improve  Medication Management: RN will administer medications as ordered by provider, will assess and evaluate patient's response and provide education to patient for prescribed medication. RN will report any adverse and/or side effects to prescribing provider.  Therapeutic Interventions: 1 on 1 counseling sessions, Psychoeducation, Medication administration, Evaluate responses to  treatment, Monitor vital signs and CBGs as ordered, Perform/monitor CIWA, COWS, AIMS and Fall Risk screenings as ordered, Perform wound care treatments as ordered.  Evaluation of Outcomes: Not Met   LCSW Treatment Plan for Primary Diagnosis: Psychosis (HCC) Long Term Goal(s): Safe transition to appropriate next level of care at discharge, Engage patient in therapeutic group addressing interpersonal concerns.  Short Term Goals: Engage patient in aftercare planning with referrals and resources, Increase social support, Increase ability to appropriately verbalize feelings, Increase emotional regulation, Facilitate acceptance of mental health diagnosis and concerns, Facilitate patient progression through stages of change regarding substance use diagnoses and concerns, Identify triggers associated with mental health/substance abuse issues, and Increase skills for wellness and recovery  Therapeutic Interventions: Assess for all discharge needs, 1 to 1 time with Social worker, Explore available resources and support systems, Assess for adequacy in community support network, Educate family and significant other(s) on suicide prevention, Complete Psychosocial Assessment, Interpersonal group therapy.  Evaluation of Outcomes: Not Met   Progress in Treatment: Attending groups: Yes. Participating in groups: Yes. Taking medication as prescribed: Yes. Toleration medication: Yes. Family/Significant other contact made: Yes, individual(s) contacted:  [name] (daughter)  [phone] Patient understands diagnosis: Yes. Discussing patient identified problems/goals with staff: Yes. Medical problems stabilized or resolved: Yes. Denies suicidal/homicidal ideation: Yes. Issues/concerns per patient self-inventory: Yes. Other:   New problem(s) identified: No, Describe:     New Short Term/Long Term Goal(s): detox, medication management for mood stabilization; elimination of SI thoughts; development of  comprehensive mental wellness/sobriety plan  OR   medication stabilization, elimination of SI thoughts, development of comprehensive mental wellness plan.    Patient Goals:  Medication Stabilization  Discharge Plan or Barriers: Patient recently admitted. CSW will continue to follow and assess for appropriate referrals and possible discharge planning.    Reason for Continuation of Hospitalization: Anxiety Delusions  Depression Medication stabilization Withdrawal symptoms  Estimated Length of Stay: 3-7 days  Last 3 [place] Suicide Severity Risk Score: Flowsheet Row [facility] (Discharged) [facility]  C-SSRS RISK CATEGORY No Risk No Risk No Risk       Last PHQ 2/9 Scores:    06/25/2022    2:46 PM 03/29/2022    2:48 PM 12/28/2021    2:51 PM  Depression screen PHQ 2/9  Decreased Interest 2 0 0  Down, Depressed, Hopeless 2 0 0  PHQ - 2 Score 4 0 0    Scribe for Treatment Team:  S.  MSW, LCSW 09/07/2022 11:42 AM   

## 2022-09-07 NOTE — Group Note (Signed)
Recreation Therapy Group Note   Group Topic:Team Building  Group Date: 09/07/2022 Start Time: 1010 End Time: 1040 Facilitators: Victorino Sparrow, LRT,CTRS Location: [address] Dayroom   Goal Area(s) Addresses:  Patient will effectively work with peer towards shared goal.  Patient will identify skills used to make activity successful.  Patient will identify how skills used during activity can be applied to reach post d/c goals.   Group Description: [employer]. In teams of 5-6, patients were given 12 craft pipe cleaners. Using the materials provided, patients were instructed to compete again the opposing team(s) to build the tallest free-standing structure from floor level. The activity was timed; difficulty increased by Probation officer as Pharmacist, hospital continued.  Systematically resources were removed with additional directions for example, placing one arm behind their back, working in silence, and shape stipulations. LRT facilitated post-activity discussion reviewing team processes and necessary communication skills involved in completion. Patients were encouraged to reflect how the skills utilized, or not utilized, in this activity can be incorporated to positively impact support systems post discharge.   Affect/Mood: Appropriate   Participation Level: Engaged   Participation Quality: Independent   Behavior: Appropriate   Speech/Thought Process: Focused   Insight: Good   Judgement: Moderate and Good   Modes of Intervention: Team-building   Patient Response to Interventions:  Engaged   Education Outcome:  Acknowledges education and In group clarification offered    Clinical Observations/Individualized Feedback: Pt was bright and engaged.  Pt worked well with peers in completing the activity.  Pt took turns with peer coming up with ideas to help them be successful in completing the activity.    Plan: Continue to engage patient in RT group sessions  2-3x/week.   Victorino Sparrow, LRT,CTRS 09/07/2022 1:48 PM

## 2022-09-07 NOTE — [facility] Group Notes (Signed)
  Spirituality group facilitated by Kathrynn Humble, [place].   Group Description: Group focused on topic of hope. Patients participated in facilitated discussion around topic, connecting with one another around experiences and definitions for hope. Group members engaged with visual explorer photos, reflecting on what hope looks like for them today. Group engaged in discussion around how their definitions of hope are present today in hospital.   Modalities: Psycho-social ed, [place], Narrative, MI   Patient Progress: [name] came briefly to group.  She showed some engagement, but did not participate.  [address] [place] Pager, [phone]

## 2022-09-07 NOTE — Progress Notes (Signed)
Adult Psychoeducational Group Note  Date:  09/07/2022 Time:  9:47 PM  Group Topic/Focus:  Wrap-Up Group:   The focus of this group is to help patients review their daily goal of treatment and discuss progress on daily workbooks.  Participation Level:  Active  Participation Quality:  Appropriate  Affect:  Appropriate  Cognitive:  Appropriate  Insight: Appropriate  Engagement in Group:  Improving  Modes of Intervention:  Discussion  Additional Comments:  Pt stated her goal for today was to focus on her treatment plan and participate in all groups held. Pt stated she accomplished her goals today. Pt stated she talked with her doctor and with her social worker about her care today. Pt rated her overall day a 8 out of 10. Pt stated she was able to contact her daughters today which improved her overall day. Pt stated she felt better about herself tonight. Pt stated she was able to attend all meals today. Pt stated she took all medications provided today. Pt stated her appetite was pretty good today. Pt rated her sleep last night was pretty good. Pt stated the goal tonight was to get some rest. Pt stated she had no physical pain tonight. Pt deny visual hallucinations and auditory issues tonight. Pt denies thoughts of harming herself or others. Pt stated she would alert staff if anything changed.  Candy Sledge 09/07/2022, 9:47 PM

## 2022-09-07 NOTE — Progress Notes (Signed)
   09/07/22 0556  Sleep  Number of Hours 5.75

## 2022-09-08 MED ORDER — GABAPENTIN 100 MG PO CAPS
200.0000 mg | ORAL_CAPSULE | Freq: Three times a day (TID) | ORAL | Status: DC
  Administered 2022-09-08 – 2022-09-09 (×3): 200 mg via ORAL
  Filled 2022-09-08 (×6): qty 2

## 2022-09-08 NOTE — Progress Notes (Signed)
   09/08/22 2140  Psych Admission Type (Psych Patients Only)  Admission Status Involuntary  Psychosocial Assessment  Patient Complaints Anxiety;Nervousness  Eye Contact Fair  Facial Expression Anxious  Affect Appropriate to circumstance  Speech Logical/coherent  Interaction Assertive  Motor Activity Slow  Appearance/Hygiene Unremarkable  Behavior Characteristics Cooperative;Appropriate to situation  Mood Anxious;Pleasant  Thought Process  Coherency WDL  Content WDL  Delusions None reported or observed  Perception WDL  Hallucination None reported or observed  Judgment WDL  Confusion None  Danger to Self  Current suicidal ideation? Denies  Danger to Others  Danger to Others None reported or observed   Progress note   D: Pt seen at med window. Pt denies SI, HI, AVH. Pt rates pain  0/10. Pt rates anxiety 4-5 /10 and depression  0/10. Pt states she had a good day. Pt had bowel movement today but a small one. Pt still taking laxatives per MAR. Pt has good intake of fluids, walking and appetite is good. Pt states her sleep has improved with PRN medications. Pt has no complaints right now.  A: Pt provided support and encouragement. Pt given scheduled medication as prescribed. PRNs as appropriate. Q15 min checks for safety.   R: Pt safe on the unit. Will continue to monitor.

## 2022-09-08 NOTE — Progress Notes (Addendum)
[facility] MD Progress Note  09/08/2022 9:38 AM [name]  MRN:  [id number] Principal Problem: Psychosis [facility]) Diagnosis: Principal Problem:   Psychosis ([place])   Reason for Admission: Paranoia, anxiety, poor self-care  Subjective:  Patient seen and assessed at bedside. Reports anxiety is better and she feels that she is better adjusted to milieu.  Complains that the seeds in the day room or very hard and she is having difficulty dealing with them.  Continues to deny SI/HI/AVH, paranoia, ideas of reference. Denies depressive symptoms. Denies side effect from gabapentin. Reports some abdominal pain and constipation today but no nausea/vomiting since Zofran yesterday. Continuing miralax scheduled to help with bowel movement. Discussed we would attempt to obtain collateral again today for safety planning and discharge goals.  Collateral:  Attempted to reach [name] (daughter) on the phone but phone number went to voicemail. Will attempt at another time  Objective:  Chart Review Past 24 hours of patient's chart was reviewed.  Patient is compliant with scheduled meds. Required Agitation PRNs: none Per RN notes, no documented behavioral issues and is  attending group.  Total Time spent with patient: 45 minutes  Past Psychiatric History:  Previous Psych Diagnoses: MDD, recurrent episode, severe with psychosis, GAD, social anxiety Prior inpatient treatment: endorses at [facility] Current/prior outpatient treatment:zyprexa, klonopin, hydroxyzine, trazodone Psychotherapy [facility]OYD History of suicide:parasuicidal behavior but no suicide attempts History of homicide: denies Psychiatric medication history:endorses Psychiatric medication compliance history:compliant while here Neuromodulation history: denies Current Psychiatrist:none Current therapist: none   Past Medical History:  Past Medical History:  Diagnosis Date   ADHD (attention deficit hyperactivity disorder)    Anxiety     Depression    Hx of staphylococcal septicemia     Past Surgical History:  Procedure Laterality Date   ABDOMINAL HYSTERECTOMY     BACK SURGERY     left elbow surgery     VIDEO BRONCHOSCOPY N/A 01/27/2013   Procedure: VIDEO BRONCHOSCOPY WITH FLUORO;  Surgeon: Elsie Stain, MD;  Location: [place];  Service: Cardiopulmonary;  Laterality: N/A;   Family History: History reviewed. No pertinent family history. Family Psychiatric  History: denies Social History:  Social History   Substance and Sexual Activity  Alcohol Use Not Currently     Social History   Substance and Sexual Activity  Drug Use Not Currently   Types: Marijuana    Social History   Socioeconomic History   Marital status: Widowed    Spouse name: Not on file   Number of children: Not on file   Years of education: Not on file   Highest education level: Not on file  Occupational History   Not on file  Tobacco Use   Smoking status: Former    Packs/day: 1.00    Years: 20.00    Total pack years: 20.00    Types: Cigarettes   Smokeless tobacco: Never  Vaping Use   Vaping Use: Never used  Substance and Sexual Activity   Alcohol use: Not Currently   Drug use: Not Currently    Types: Marijuana   Sexual activity: Not on file  Other Topics Concern   Not on file  Social History Narrative   Not on file   Social Determinants of Health   Financial Resource Strain: Not on file  Food Insecurity: Unknown (09/05/2022)   Hunger Vital Sign    Worried About Running Out of Food in the Last Year: Patient refused    [place] in the Last Year:  Patient refused  Transportation Needs: Unknown (09/05/2022)   PRAPARE - Hydrologist (Medical): Patient refused    Lack of Transportation (Non-Medical): Patient refused  Physical Activity: Not on file  Stress: Not on file  Social Connections: Not on file   Additional Social History:                         Current  Medications: Current Facility-Administered Medications  Medication Dose Route Frequency Provider Last Rate Last Admin   acetaminophen (TYLENOL) tablet 650 mg  650 mg Oral Q6H PRN Rolanda Lundborg, MD       alum & mag hydroxide-simeth (MAALOX/MYLANTA) 200-200-20 MG/5ML suspension 30 mL  30 mL Oral Q4H PRN Rolanda Lundborg, MD       clonazePAM Bobbye [place]) tablet 0.5 mg  0.5 mg Oral BID Rolanda Lundborg, MD   0.5 mg at 09/08/22 0755   feeding supplement (ENSURE ENLIVE / ENSURE PLUS) liquid 237 mL  237 mL Oral TID BM France Ravens, MD   237 mL at 09/08/22 0910   gabapentin (NEURONTIN) capsule 100 mg  100 mg Oral TID France Ravens, MD   100 mg at 09/08/22 0756   hydrOXYzine (ATARAX) tablet 25 mg  25 mg Oral TID PRN Rolanda Lundborg, MD   25 mg at 09/07/22 2035   magnesium hydroxide (MILK OF MAGNESIA) suspension 30 mL  30 mL Oral Daily PRN Rolanda Lundborg, MD   30 mL at 09/07/22 0809   OLANZapine (ZYPREXA) tablet 5 mg  5 mg Oral Daily Rolanda Lundborg, MD   5 mg at 09/08/22 0756   And   OLANZapine (ZYPREXA) tablet 10 mg  10 mg Oral QHS Rolanda Lundborg, MD   10 mg at 09/07/22 2035   ondansetron (ZOFRAN-ODT) disintegrating tablet 4 mg  4 mg Oral Q8H PRN France Ravens, MD   4 mg at 09/06/22 1138   polyethylene glycol (MIRALAX / GLYCOLAX) packet 17 g  17 g Oral Daily France Ravens, MD   17 g at 09/08/22 0758   senna (SENOKOT) tablet 17.2 mg  2 tablet Oral Daily France Ravens, MD   17.2 mg at 09/08/22 0758   traZODone (DESYREL) tablet 50 mg  50 mg Oral QHS PRN France Ravens, MD   50 mg at 09/07/22 2035    Lab Results:  No results found for this or any previous visit (from the past 24 hour(s)).  Blood Alcohol level:  Lab Results  Component Value Date   ETH <10 08/26/2022   ETH <10 18/29/9371    Metabolic Disorder Labs: Lab Results  Component Value Date   HGBA1C 5.0 09/02/2022   MPG 96.8 09/02/2022   MPG 96.8 08/08/2022   Lab Results  Component Value Date   PROLACTIN 19.2 11/13/2020   Lab Results   Component Value Date   CHOL 180 09/02/2022   TRIG 38 09/02/2022   HDL 68 09/02/2022   CHOLHDL 2.6 09/02/2022   VLDL 8 09/02/2022   LDLCALC 104 (H) 09/02/2022   LDLCALC 133 (H) 08/08/2022    Physical Findings: AIMS: 0, no cogwheeling rigidity, no abnormal facial movements  Musculoskeletal: Strength & Muscle Tone: within normal limits Gait & Station: normal  Psychiatric Specialty Exam:  Presentation  General Appearance: Fairly Groomed   Eye Contact:Fair   Speech:Normal Rate   Speech Volume:Normal   Handedness:Right    Mood and Affect  Mood:Irritable; Anxious   Affect:Labile    Thought Process  Thought Processes:Disorganized  Descriptions of Associations:Circumstantial   Orientation:Full (Time, Place and Person)   Thought Content:Rumination   History of Schizophrenia/Schizoaffective disorder:No   Duration of Psychotic Symptoms:Less than six months   Hallucinations:No data recorded  Ideas of Reference:Percusatory   Suicidal Thoughts:No data recorded  Homicidal Thoughts:No data recorded   Sensorium  Memory:Immediate Fair; Recent Fair   Judgment:Fair   Insight:Fair    Executive Functions  Concentration:Fair   Attention Span:Fair   [place]    Psychomotor Activity  Psychomotor Activity:No data recorded   Assets  Assets:Desire for Improvement; Resilience; Social Support    Sleep  Sleep:No data recorded    Physical Exam: Review of Systems  Respiratory:  Negative for shortness of breath.   Cardiovascular:  Negative for chest pain.  Gastrointestinal:  Positive for constipation. Negative for abdominal pain, diarrhea, heartburn, nausea and vomiting.  Neurological:  Negative for headaches.   Blood pressure 111/86, pulse 94, temperature 98.2 F (36.8 C), temperature source Oral, resp. rate 20, height '5\' 4"'$  (1.626 m), weight 54.1 kg, SpO2 100 %. Body mass index is  20.46 kg/m.   ASSESSMENT AND PLAN [name] is a 51 y.o. female with psychiatric hx of bipolar disorder, GAD ADHD, cannabis use disorder, sedative use disorder, and cocaine use disorder who returns to [facility] after being medically cleared from acute aphasia and mutism for continued psychosis.   PLAN Safety and Monitoring: Involuntary admission to inpatient psychiatric unit for safety, stabilization and treatment Daily contact with patient to assess and evaluate symptoms and progress in treatment Patient's case to be discussed in multi-disciplinary team meeting Observation Level : q15 minute checks Vital signs: q12 hours Precautions: suicide, elopement, and assault   Psychiatric Problems MDD recurrent severe with psychosis (r/o SIMD, r/o substance induced psychosis, r/o acute benzodiazepines withdrawal delirium) GAD by hx Social anxiety d/o by hx -Continue Zyprexa 5 mg in the morning and 10 mg at night for psychosis -Continue Klonopin 0.5 mg twice daily for severe anxiety and benzodiazepine misuse -INCREASE gabapentin to 200 mg 3 times daily for anxiety and neuropathic pain -- Metabolic profile and EKG monitoring obtained while on an atypical antipsychotic (BMI: 20.60 Lipid Panel: LDL cholesterol 104, HbgA1c: 5.0 QTc: 457) -- Encouraged patient to participate in unit milieu and in scheduled group therapies    3. Medical Management Constipation Nausea/vomiting Patient states that this is due to medications but appears to be more consistent with severe anxiety and food intake. Poor historian regarding this. -Zofran PRN -Miralax+Senokot scheduled -Monitor as needed   Protein-calorie malnutrition  -Increase ensure to TID for additional nutritional support -Encourage PO intake -Will get BMP in 2 days to assess electrolyte -daily weights   PRN The following PRN medications were added to ensure patient can focus on treatment. These were discussed with patient and patient aware of  ability to ask for the following medications:  -Tylenol 650 mg q6hr PRN for mild pain -Mylanta 30 ml suspension for indigestion -Milk of Magnesia 30 ml for constipation -Trazodone 50 mg qhs for insomnia -Hydroxyzine 25 mg tid PRN for anxiety   4. Discharge Planning Patient will require the following based on my assessment:  Greatly appreciate CSW and Case management assistance with facilitating these needs and any further recommendations regarding patient's needs upon discharge. Estimated LOS: 5 days Discharge Concerns: Need to establish a safety plan; Medication compliance and effectiveness Discharge Goals: Return home with outpatient referrals for mental health follow-up including medication management/psychotherapy   Mitzi Hansen  Lurline Hare, MD 09/08/2022, 9:38 AM

## 2022-09-08 NOTE — Progress Notes (Signed)
   09/08/22 0543  Sleep  Number of Hours 8.25

## 2022-09-08 NOTE — Plan of Care (Signed)
  Problem: Coping: Goal: Ability to verbalize frustrations and anger appropriately will improve Outcome: Progressing   Problem: Health Behavior/Discharge Planning: Goal: Compliance with treatment plan for underlying cause of condition will improve Outcome: Progressing   Problem: Safety: Goal: Ability to remain free from injury will improve Outcome: Progressing

## 2022-09-08 NOTE — Progress Notes (Signed)
   09/08/22 0808  Psych Admission Type (Psych Patients Only)  Admission Status Involuntary  Psychosocial Assessment  Patient Complaints Anxiety;Nervousness  Eye Contact Fair  Facial Expression Anxious  Affect Appropriate to circumstance  Speech Logical/coherent  Interaction Assertive  Motor Activity Slow  Appearance/Hygiene Unremarkable  Behavior Characteristics Cooperative;Appropriate to situation;Calm  Mood Anxious;Pleasant  Thought Process  Coherency WDL  Content WDL  Delusions None reported or observed  Perception WDL  Hallucination None reported or observed  Judgment Poor  Confusion None  Danger to Self  Current suicidal ideation? Denies  Agreement Not to Harm Self Yes  Description of Agreement verbally contracts for safety  Danger to Others  Danger to Others None reported or observed

## 2022-09-08 NOTE — Progress Notes (Signed)
Adult Psychoeducational Group Note  Date:  09/08/2022 Time:  10:22 PM  Group Topic/Focus:  Wrap-Up Group:   The focus of this group is to help patients review their daily goal of treatment and discuss progress on daily workbooks.  Participation Level:  Active  Participation Quality:  Appropriate  Affect:  Appropriate  Cognitive:  Appropriate  Insight: Appropriate  Engagement in Group:  Improving  Modes of Intervention:  Discussion  Additional Comments: Pt stated her goal for today was to focus on her treatment plan. Pt stated she accomplished her goal today. Pt stated she talked with her doctor and with her social worker about her care today. Pt rated her overall day a 8 out of 10. Pt stated she was able to contact her daughter and a family friend today which improved her overall day. Pt stated she felt better about herself tonight. Pt stated she was able to attend all meals today. Pt stated she took all medications provided today. Pt stated her appetite was pretty good today. Pt rated her sleep last night was pretty good. Pt stated the goal tonight was to get some rest. Pt stated she had some physical pain tonight. Pt stated she had some moderate pain in her stomach tonight. Pt rated the moderate pain in her stomach a 5 on the pain level scale. Pt nurse was updated on the situation. Pt deny visual hallucinations and auditory issues tonight. Pt denies thoughts of harming herself or others. Pt stated she would alert staff if anything changed   Candy Sledge 09/08/2022, 10:22 PM

## 2022-09-08 NOTE — Group Note (Signed)
  [facility] LCSW Group Therapy Note  Date/Time:  09/08/2022   Type of Therapy and Topic:  Group Therapy:  Feelings About Hospitalization  Participation Level:  Minimal   Description of Group This process group involved patients discussing their feelings related to being hospitalized, as well as the benefits they see to being in the hospital.  These feelings and benefits were itemized.  The group then brainstormed specific ways in which they could seek those same benefits when they discharge and return home.  Therapeutic Goals Patient will identify and describe positive and negative feelings related to hospitalization Patient will verbalize benefits of hospitalization to themselves personally Patients will brainstorm together ways they can obtain similar benefits in the outpatient setting, identify barriers to wellness and possible solutions  Summary of Patient Progress:  The patient refused to share during group and said she could hear what everyone else was talking about.  Therapeutic Modalities Cognitive Behavioral Therapy Motivational [place], [place] 09/08/2022, 11:56 AM

## 2022-09-09 MED ORDER — GABAPENTIN 300 MG PO CAPS
300.0000 mg | ORAL_CAPSULE | Freq: Three times a day (TID) | ORAL | Status: DC
  Administered 2022-09-09 – 2022-09-11 (×7): 300 mg via ORAL
  Filled 2022-09-09 (×12): qty 1

## 2022-09-09 NOTE — Progress Notes (Signed)
   09/09/22 2200  Psych Admission Type (Psych Patients Only)  Admission Status Involuntary  Psychosocial Assessment  Patient Complaints Anxiety;Nervousness  Eye Contact Fair  Facial Expression Anxious  Affect Appropriate to circumstance  Speech Logical/coherent  Interaction Assertive  Motor Activity Slow  Appearance/Hygiene Unremarkable  Behavior Characteristics Cooperative;Appropriate to situation  Mood Anxious;Pleasant  Thought Process  Coherency WDL  Content WDL  Delusions None reported or observed  Perception WDL  Hallucination None reported or observed  Judgment WDL  Confusion None  Danger to Self  Current suicidal ideation? Denies  Danger to Others  Danger to Others None reported or observed   Progress Note  D: Patient denies SI/HI/AVH and pain but endorses pressure in the lower abdomen from the need to move her bowel. LBM was 9/23. Patient had received scheduled and prn laxatives-was encouraged to continue to drink copious fluids. Patient reports appetite is improving steadily, denies depression, rates anxiety 7/10, requested and received PRN Vistaril and Trazodone as prescribed for anxiety and sleep with good effect.   A: Vitals and assessment completed, q 15 minutes safety checks maintained, medications administered as prescribed, prns given as needed, support and reassurance provided.   R: Will continue to monitor.

## 2022-09-09 NOTE — Plan of Care (Signed)
Alert, verbal to make needs known. Patient is ambulating around the unit and interacting with peers. She denies any SI/HI/AVH but does endorse some anxiety and depression. Denies any pain at present.     Problem: Education: Goal: Knowledge of [facility] Education information/materials will improve Outcome: Progressing Goal: Emotional status will improve Outcome: Progressing Goal: Mental status will improve Outcome: Progressing Goal: Verbalization of understanding the information provided will improve Outcome: Progressing   Problem: Activity: Goal: Interest or engagement in activities will improve Outcome: Progressing Goal: Sleeping patterns will improve Outcome: Progressing   Problem: Coping: Goal: Ability to verbalize frustrations and anger appropriately will improve Outcome: Progressing Goal: Ability to demonstrate self-control will improve Outcome: Progressing   Problem: Health Behavior/Discharge Planning: Goal: Identification of resources available to assist in meeting health care needs will improve Outcome: Progressing Goal: Compliance with treatment plan for underlying cause of condition will improve Outcome: Progressing   Problem: Physical Regulation: Goal: Ability to maintain clinical measurements within normal limits will improve Outcome: Progressing   Problem: Safety: Goal: Periods of time without injury will increase Outcome: Progressing   Problem: Activity: Goal: Will verbalize the importance of balancing activity with adequate rest periods Outcome: Progressing   Problem: Activity: Goal: Will verbalize the importance of balancing activity with adequate rest periods Outcome: Progressing   Problem: Education: Goal: Will be free of psychotic symptoms Outcome: Progressing Goal: Knowledge of the prescribed therapeutic regimen will improve Outcome: Progressing   Problem: Coping: Goal: Coping ability will improve Outcome: Progressing Goal: Will  verbalize feelings Outcome: Progressing   Problem: Health Behavior/Discharge Planning: Goal: Compliance with prescribed medication regimen will improve Outcome: Progressing   Problem: Nutritional: Goal: Ability to achieve adequate nutritional intake will improve Outcome: Progressing   Problem: Role Relationship: Goal: Ability to communicate needs accurately will improve Outcome: Progressing Goal: Ability to interact with others will improve Outcome: Progressing   Problem: Safety: Goal: Ability to redirect hostility and anger into socially appropriate behaviors will improve Outcome: Progressing Goal: Ability to remain free from injury will improve Outcome: Progressing   Problem: Self-Care: Goal: Ability to participate in self-care as condition permits will improve Outcome: Progressing   Problem: Self-Concept: Goal: Will verbalize positive feelings about self Outcome: Progressing   Problem: Education: Goal: Ability to state activities that reduce stress will improve Outcome: Progressing   Problem: Coping: Goal: Ability to identify and develop effective coping behavior will improve Outcome: Progressing   Problem: Self-Concept: Goal: Ability to identify factors that promote anxiety will improve Outcome: Progressing Goal: Level of anxiety will decrease Outcome: Progressing Goal: Ability to modify response to factors that promote anxiety will improve Outcome: Progressing

## 2022-09-09 NOTE — Progress Notes (Signed)
Date: 09/09/2022 Time: 6:30PM  Group Topic/Focus:  The focus of this group was to "break the ice" by throwing a ball around with different questions about the patient. The patient was engaged and identified different areas of strengths and weaknesses in themselves. They also identified coping skills.   Participation level: Active  Participation quality: Appropriate  Affect: Appropriate  Cognitive: Appropriate  Insight: Appropriate  Engagement in group: Engaged  Modes of Intervention: Discussion  Luanna Salk RN 09/09/22 6:30PM

## 2022-09-09 NOTE — Progress Notes (Signed)
Adult Psychoeducational Group Note  Date:  09/09/2022 Time:  9:22 PM  Group Topic/Focus:  Wrap-Up Group:   The focus of this group is to help patients review their daily goal of treatment and discuss progress on daily workbooks.  Participation Level:  Active  Participation Quality:  Appropriate  Affect:  Appropriate  Cognitive:  Appropriate  Insight: Appropriate  Engagement in Group:  Improving  Modes of Intervention:  Discussion  Additional Comments:   Pt stated her goal for today was to focus on her treatment plan and talk with her doctor about her discharge plan. Pt stated she accomplished her goals today. Pt stated she talked with her doctor and with her social worker about her care today. Pt rated her overall day a 9 out of 10. Pt stated the plan is for her to discharge on 09/11/22. Pt stated she was able to contact her daughter, son and her family friend today which improved her overall day. Pt stated she felt better about herself tonight. Pt stated she was able to attend all meals today. Pt stated she took all medications provided today. Pt stated her appetite was pretty good today. Pt rated her sleep last night was pretty good. Pt stated the goal tonight was to get some rest. Pt stated she had no physical pain tonight.  Pt deny visual hallucinations and auditory issues tonight. Pt denies thoughts of harming herself or others. Pt stated she would alert staff if anything changed   Candy Sledge 09/09/2022, 9:22 PM

## 2022-09-09 NOTE — Progress Notes (Signed)
Date:  09/09/2022 Time:  9:00AM   Group Topic/Focus:   Orientation:   The focus of this group is to educate the patient on the purpose and policies of crisis stabilization and provide a format to answer questions about their admission.  The group details unit policies and expectations of patients while admitted.       Participation Level:  Active   Participation Quality:  Appropriate   Affect:  Appropriate   Cognitive:  Appropriate   Insight: Appropriate   Engagement in Group:  Engaged   Modes of Intervention:  Discussion   Additional Comments:      Luanna Salk RN 09/09/2022, 9:30 AM

## 2022-09-09 NOTE — Progress Notes (Signed)
   09/09/22 0546  Sleep  Number of Hours 6

## 2022-09-09 NOTE — Group Note (Signed)
LCSW Group Therapy   Due to there being only one CSW across both adult and C/A unit today and unit needs, group was unable to be held for [address] on 09/09/2022. Patients were offered to meet with CSW as needed.  Adell Koval T Xitlaly Ault LCSWA  11:32 AM

## 2022-09-09 NOTE — Progress Notes (Signed)
[facility] MD Progress Note  09/09/2022 10:39 AM [name]  MRN:  [id number] Principal Problem: Psychosis [facility]) Diagnosis: Principal Problem:   Psychosis ([place])   Reason for Admission: Paranoia, anxiety, poor self-care  Subjective:  Patient seen and assessed at bedside. Reports anxiety is better and she feels that she is better adjusted to milieu.  Continues to complain that the seats in the day room are very hard and she is having difficulty dealing with them.  Continues to deny SI/HI/AVH, paranoia, ideas of reference. Denies depressive symptoms. Denies side effect from gabapentin. Denies abdominal pain but still has some constipation today but no nausea/vomiting. Continuing miralax scheduled to help with bowel movement. Discussed we would attempt to obtain collateral again today for safety planning and discharge goals.  Continues to be tearful and somewhat anxious but appears less internally preoccupied and more concrete of alcohol settings and future goals.  Collateral:  Again attempted to reach [name] (daughter) on the phone but phone number went to voicemail. Left HIPPA compliant voicemail. Will attempt at another time  Objective:  Chart Review Past 24 hours of patient's chart was reviewed.  Patient is compliant with scheduled meds. Required Agitation PRNs: none Per RN notes, no documented behavioral issues and is  attending group.  Total Time spent with patient: 45 minutes  Past Psychiatric History:  Previous Psych Diagnoses: MDD, recurrent episode, severe with psychosis, GAD, social anxiety Prior inpatient treatment: endorses at [facility] Current/prior outpatient treatment:zyprexa, klonopin, hydroxyzine, trazodone Psychotherapy [facility] History of suicide:parasuicidal behavior but no suicide attempts History of homicide: denies Psychiatric medication history:endorses Psychiatric medication compliance history:compliant while here Neuromodulation history:  denies Current Psychiatrist:none Current therapist: none   Past Medical History:  Past Medical History:  Diagnosis Date   ADHD (attention deficit hyperactivity disorder)    Anxiety    Depression    Hx of staphylococcal septicemia     Past Surgical History:  Procedure Laterality Date   ABDOMINAL HYSTERECTOMY     BACK SURGERY     left elbow surgery     VIDEO BRONCHOSCOPY N/A 01/27/2013   Procedure: VIDEO BRONCHOSCOPY WITH FLUORO;  Surgeon: Elsie Stain, MD;  Location: [facility];  Service: Cardiopulmonary;  Laterality: N/A;   Family History: History reviewed. No pertinent family history. Family Psychiatric  History: denies Social History:  Social History   Substance and Sexual Activity  Alcohol Use Not Currently     Social History   Substance and Sexual Activity  Drug Use Not Currently   Types: Marijuana    Social History   Socioeconomic History   Marital status: Widowed    Spouse name: Not on file   Number of children: Not on file   Years of education: Not on file   Highest education level: Not on file  Occupational History   Not on file  Tobacco Use   Smoking status: Former    Packs/day: 1.00    Years: 20.00    Total pack years: 20.00    Types: Cigarettes   Smokeless tobacco: Never  Vaping Use   Vaping Use: Never used  Substance and Sexual Activity   Alcohol use: Not Currently   Drug use: Not Currently    Types: Marijuana   Sexual activity: Not on file  Other Topics Concern   Not on file  Social History Narrative   Not on file   Social Determinants of Health   Financial Resource Strain: Not on file  Food Insecurity: Unknown (09/05/2022)   Hunger  Vital Sign    Worried About Charity fundraiser in the Last Year: Patient refused    [place] in the Last Year: Patient refused  Transportation Needs: Unknown (09/05/2022)   [place] - Hydrologist (Medical): Patient refused    Lack of Transportation (Non-Medical):  Patient refused  Physical Activity: Not on file  Stress: Not on file  Social Connections: Not on file   Additional Social History:                         Current Medications: Current Facility-Administered Medications  Medication Dose Route Frequency Provider Last Rate Last Admin   acetaminophen (TYLENOL) tablet 650 mg  650 mg Oral Q6H PRN Rolanda Lundborg, MD   650 mg at 09/08/22 1717   alum & mag hydroxide-simeth (MAALOX/MYLANTA) 200-200-20 MG/5ML suspension 30 mL  30 mL Oral Q4H PRN Rolanda Lundborg, MD       clonazePAM Bobbye [place]) tablet 0.5 mg  0.5 mg Oral BID Rolanda Lundborg, MD   0.5 mg at 09/09/22 0811   feeding supplement (ENSURE ENLIVE / ENSURE PLUS) liquid 237 mL  237 mL Oral TID BM France Ravens, MD   237 mL at 09/09/22 0815   gabapentin (NEURONTIN) capsule 200 mg  200 mg Oral TID France Ravens, MD   200 mg at 09/09/22 1093   hydrOXYzine (ATARAX) tablet 25 mg  25 mg Oral TID PRN Rolanda Lundborg, MD   25 mg at 09/08/22 2037   magnesium hydroxide (MILK OF MAGNESIA) suspension 30 mL  30 mL Oral Daily PRN Rolanda Lundborg, MD   30 mL at 09/07/22 0809   OLANZapine (ZYPREXA) tablet 5 mg  5 mg Oral Daily Rolanda Lundborg, MD   5 mg at 09/09/22 2355   And   OLANZapine (ZYPREXA) tablet 10 mg  10 mg Oral QHS Rolanda Lundborg, MD   10 mg at 09/08/22 2036   ondansetron (ZOFRAN-ODT) disintegrating tablet 4 mg  4 mg Oral Q8H PRN France Ravens, MD   4 mg at 09/06/22 1138   polyethylene glycol (MIRALAX / GLYCOLAX) packet 17 g  17 g Oral Daily France Ravens, MD   17 g at 09/09/22 0813   senna (SENOKOT) tablet 17.2 mg  2 tablet Oral Daily France Ravens, MD   17.2 mg at 09/09/22 0811   traZODone (DESYREL) tablet 50 mg  50 mg Oral QHS PRN France Ravens, MD   50 mg at 09/08/22 2037    Lab Results:  No results found for this or any previous visit (from the past 24 hour(s)).  Blood Alcohol level:  Lab Results  Component Value Date   ETH <10 08/26/2022   ETH <10 73/22/0254    Metabolic Disorder  Labs: Lab Results  Component Value Date   HGBA1C 5.0 09/02/2022   MPG 96.8 09/02/2022   MPG 96.8 08/08/2022   Lab Results  Component Value Date   PROLACTIN 19.2 11/13/2020   Lab Results  Component Value Date   CHOL 180 09/02/2022   TRIG 38 09/02/2022   HDL 68 09/02/2022   CHOLHDL 2.6 09/02/2022   VLDL 8 09/02/2022   LDLCALC 104 (H) 09/02/2022   LDLCALC 133 (H) 08/08/2022    Physical Findings: AIMS: 0, no cogwheeling rigidity, no abnormal facial movements  Musculoskeletal: Strength & Muscle Tone: within normal limits Gait & Station: normal  Psychiatric Specialty Exam:  Presentation  General Appearance: Appropriate for Environment; Casual   Eye  Contact:Good   Speech:Clear and Coherent; Normal Rate   Speech Volume:Normal   Handedness:Right    Mood and Affect  Mood:Anxious; Irritable   Affect:Tearful    Thought Process  Thought Processes:Coherent; Goal Directed; Linear   Descriptions of Associations:Intact   Orientation:Full (Time, Place and Person)   Thought Content:Logical   History of Schizophrenia/Schizoaffective disorder:No   Duration of Psychotic Symptoms:Less than six months   Hallucinations:Hallucinations: None  Ideas of Reference:None   Suicidal Thoughts:Suicidal Thoughts: No  Homicidal Thoughts:Homicidal Thoughts: No   Sensorium  Memory:Immediate Good; Recent Good; Remote Good   Judgment:Fair   Insight:Fair    Executive Functions  Concentration:Good   Attention Span:Good   [place] of Knowledge:Good   Language:Good    Psychomotor Activity  Psychomotor Activity:Psychomotor Activity: Normal   Assets  Assets:Communication Skills; Desire for Improvement; Physical Health    Sleep  Sleep:Sleep: Good    Physical Exam: Review of Systems  Respiratory:  Negative for shortness of breath.   Cardiovascular:  Negative for chest pain.  Gastrointestinal:  Positive for constipation.  Negative for abdominal pain, diarrhea, heartburn, nausea and vomiting.  Neurological:  Negative for headaches.   Blood pressure 114/87, pulse 85, temperature (!) 97.4 F (36.3 C), temperature source Oral, resp. rate 20, height '5\' 4"'$  (1.626 m), weight 55.2 kg, SpO2 98 %. Body mass index is 20.87 kg/m.   ASSESSMENT AND PLAN Patrisia R Brissett is a 51 y.o. female with psychiatric hx of bipolar disorder, GAD ADHD, cannabis use disorder, sedative use disorder, and cocaine use disorder who returns to [facility] after being medically cleared from acute aphasia and mutism for continued psychosis.   PLAN Safety and Monitoring: Involuntary admission to inpatient psychiatric unit for safety, stabilization and treatment Daily contact with patient to assess and evaluate symptoms and progress in treatment Patient's case to be discussed in multi-disciplinary team meeting Observation Level : q15 minute checks Vital signs: q12 hours Precautions: suicide, elopement, and assault   Psychiatric Problems MDD recurrent severe with psychosis (r/o SIMD, r/o substance induced psychosis, r/o acute benzodiazepines withdrawal delirium) GAD by hx Social anxiety d/o by hx -Continue Zyprexa 5 mg in the morning and 10 mg at night for psychosis -Continue Klonopin 0.5 mg twice daily for severe anxiety and benzodiazepine misuse -INCREASE gabapentin to 300 mg 3 times daily for anxiety and neuropathic pain -- Metabolic profile and EKG monitoring obtained while on an atypical antipsychotic (BMI: 20.60 Lipid Panel: LDL cholesterol 104, HbgA1c: 5.0 QTc: 457) -- Encouraged patient to participate in unit milieu and in scheduled group therapies    3. Medical Management Constipation Nausea/vomiting Patient states that this is due to medications but appears to be more consistent with severe anxiety and food intake. Poor historian regarding this. -Zofran PRN -Miralax+Senokot scheduled -Monitor as needed   Protein-calorie malnutrition   -Increase ensure to TID for additional nutritional support -Encourage PO intake -Will get BMP in 2 days to assess electrolyte -daily weights   PRN The following PRN medications were added to ensure patient can focus on treatment. These were discussed with patient and patient aware of ability to ask for the following medications:  -Tylenol 650 mg q6hr PRN for mild pain -Mylanta 30 ml suspension for indigestion -Milk of Magnesia 30 ml for constipation -Trazodone 50 mg qhs for insomnia -Hydroxyzine 25 mg tid PRN for anxiety   4. Discharge Planning Patient will require the following based on my assessment:  Greatly appreciate CSW and Case management assistance with facilitating these needs  and any further recommendations regarding patient's needs upon discharge. Estimated LOS: 5 days Discharge Concerns: Need to establish a safety plan; Medication compliance and effectiveness Discharge Goals: Return home with outpatient referrals for mental health follow-up including medication management/psychotherapy   France Ravens, MD 09/09/2022, 10:39 AM

## 2022-09-10 DIAGNOSIS — F200 Paranoid schizophrenia: Secondary | ICD-10-CM

## 2022-09-10 MED ORDER — CLONAZEPAM 0.5 MG PO TABS: 0.5000 mg | ORAL_TABLET | Freq: Two times a day (BID) | ORAL | 0 refills | Status: DC

## 2022-09-10 MED ORDER — OLANZAPINE 10 MG PO TABS: 10.0000 mg | ORAL_TABLET | Freq: Every day | ORAL | 0 refills | Status: DC

## 2022-09-10 MED ORDER — HYDROXYZINE HCL 25 MG PO TABS: 25.0000 mg | ORAL_TABLET | Freq: Three times a day (TID) | ORAL | 0 refills | Status: DC | PRN

## 2022-09-10 MED ORDER — SENNA 8.6 MG PO TABS: 2.0000 | ORAL_TABLET | Freq: Every day | ORAL | 0 refills | Status: AC

## 2022-09-10 MED ORDER — OLANZAPINE 5 MG PO TABS: 5.0000 mg | ORAL_TABLET | Freq: Every day | ORAL | 0 refills | Status: DC

## 2022-09-10 MED ORDER — TRAZODONE HCL 50 MG PO TABS: 50.0000 mg | ORAL_TABLET | Freq: Every evening | ORAL | 0 refills | Status: DC | PRN

## 2022-09-10 MED ORDER — GABAPENTIN 300 MG PO CAPS: 300.0000 mg | ORAL_CAPSULE | Freq: Three times a day (TID) | ORAL | 0 refills | Status: DC

## 2022-09-10 MED ORDER — POLYETHYLENE GLYCOL 3350 17 G PO PACK
17.0000 g | PACK | Freq: Two times a day (BID) | ORAL | Status: DC
  Administered 2022-09-10 – 2022-09-11 (×2): 17 g via ORAL
  Filled 2022-09-10 (×6): qty 1

## 2022-09-10 NOTE — Progress Notes (Signed)
Nursing Note: 0700-1900  D:"  Pt presents with anxious mood and intermittently irritable throughout the shift.  Reports that she slept "ok" last night, "As good I can, awake some and slept some, I don't know what you want from me, its not like I can sweep or do housework here!"  Pt vacilates between pleasant/cooperative and irritable/short with responses.  States that appetite is fair and she is tolerating prescribed medication without side effects.  Goal for today: "Sleep/puzzles/eat/wait."  A:  Pt. encouraged to verbalize needs and concerns, active listening and support provided.  Continued Q 15 minute safety checks.  Observed active participation in group settings.  R:  Pt. irritable this am, observed significant improvement in mood this afternoon, after speaking with her daughter on phone and receiving glasses and teeth. Denies A/V hallucinations and is able to verbally contract for safety.   09/10/22 0900  Psych Admission Type (Psych Patients Only)  Admission Status Involuntary  Psychosocial Assessment  Patient Complaints None  Eye Contact Brief  Facial Expression Anxious  Affect Appropriate to circumstance  Speech Logical/coherent  Interaction Assertive  Motor Activity Other (Comment) (unremarkable)  Appearance/Hygiene Unremarkable  Behavior Characteristics Cooperative;Appropriate to situation  Mood Anxious;Labile;Irritable (Intermittently iritable.)  Thought Process  Coherency WDL  Content WDL  Delusions None reported or observed  Perception WDL  Hallucination None reported or observed  Judgment Impaired  Confusion None  Danger to Self  Current suicidal ideation? Denies  Agreement Not to Harm Self Yes  Description of Agreement Verbal  Danger to Others  Danger to Others None reported or observed

## 2022-09-10 NOTE — Group Note (Signed)
  [facility] LCSW Group Therapy Note  Date/Time:  09/10/2022   Type of Therapy and Topic:  Group Therapy:  Self-Care after Hospitalization  Participation Level:  Active   Description of Group This process group involved patients discussing how they plan to take care of themselves in a better manner when they get home from the hospital.  The group started with patients listing one healthy self-care they hope to engage in at discharge that they did not use prior to admission.  We discussed a variety of other means of self-care which had a large range from hygiene activities to eating to setting boundaries to participating in peer support groups.  The primary focus by CSW was to point out commonalities.  When there were participants who stated everyone else can get help, but they themselves are "beyond help," this was discussed in detail and this belief was gently challenged.  Finally, it was announced that immediately following group was to be "Hygiene Hour" where everyone would go to their rooms and take care of their personal hygiene.  This was met with wide acceptance.  Therapeutic Goals Patient will identify and describe one self-care activity to deliberately plan to use upon hospital discharge Patient will participate in generating additional ideas about healthy self-care options when they return to the community Patients will be supportive of one another and receive support from others Patients will be challenged to realize that they are not "beyond help" any more than other participants in the room are  Summary of Patient Progress:  Patient participated appropriately in group and had good insight into the group topic.  Patient discussed that her motivation to prioritize mental health was so that she doesn't negatively affect others around her. Patient participated in wellness plan to maintain and care for herself when she struggles with mental health.    Therapeutic Modalities Brief  Solution-Focused Therapy Psychoeducation   Camreigh Michie,  LCSW 09/10/2022, 2:29 PM

## 2022-09-10 NOTE — [facility] Suicide Risk Assessment (Signed)
[facility] INPATIENT:  Family/Significant Other Suicide Prevention Education  Suicide Prevention Education:  Education Completed; [name],  [phone],  (name of family member/significant other) has been identified by the patient as the family member/significant other with whom the patient will be residing, and identified as the person(s) who will aid the patient in the event of a mental health crisis (suicidal ideations/suicide attempt).  With written consent from the patient, the family member/significant other has been provided the following suicide prevention education, prior to the and/or following the discharge of the patient.  CSW discussed safety planning with patient daughter and went over resources regarding ongoing support for patient.  Daughter demonstrated understanding and stated that she would pick patient up tomorrow at 3:30pm for discharge.  No additional safety concerns.     The suicide prevention education provided includes the following: Suicide risk factors Suicide prevention and interventions National Suicide Hotline telephone number [facility] assessment telephone number [facility] Emergency Assistance [place] and/or Residential Mobile Crisis Unit telephone number  Request made of family/significant other to: Remove weapons (e.g., guns, rifles, knives), all items previously/currently identified as safety concern.   Remove drugs/medications (over-the-counter, prescriptions, illicit drugs), all items previously/currently identified as a safety concern.  The family member/significant other verbalizes understanding of the suicide prevention education information provided.  The family member/significant other agrees to remove the items of safety concern listed above.  Carthel Castille E Bradlee Heitman 09/10/2022, 4:05 PM

## 2022-09-10 NOTE — Plan of Care (Addendum)
Collateral: [name] [phone]  Was able to reach [name] (daughter) today.  Collateral reports that patient's speech has significantly improved and is no longer slurred.  Collateral states that over the phone, patient is significantly less anxious and eager to leave the hospital with clear goals in mind.  Daughter states that patient sounds motivated and less depressed.  Daughter states that patient psychosis seems significantly improved compared to before.  Daughter does not have any safety concerns regarding patient stating that patient does not have access to firearms nor she stockpiling medications.  Main concerns that daughter would like to get addressed is bus service to take patient to doctors appointments as well as regular wellness checks if possible.  Discussed that we would have LCSW see what available options she has.  Daughter states that she will bring dentures and glasses in the next hour or so. Daughter states she will call patient regularly to check in as able.  France Ravens, MD PGY2 Psychiatry Resident

## 2022-09-10 NOTE — Progress Notes (Signed)
[facility] MD Progress Note  09/10/2022 1:43 PM [name]  MRN:  [id number] Principal Problem: Psychosis [facility]) Diagnosis: Principal Problem:   Psychosis ([place])   Reason for Admission: Paranoia, anxiety, poor self-care  Subjective:  Patient seen and assessed at bedside.  Patient presented more irritable today given she is frustrated she is still here.  Placement seems to blame all of her stress and anxiety related to her being in the hospital.  Discussed with patient that she had not come to the hospital for those reasons not the reason for her continued hospitalization.  Discussed that she needed to work on identifying the nature of her anxiety as well as allowing for her anxiety to be addressed in a productive and effective way rather than constantly ruminating regarding her hospitalization.  Patient reports feeling bored and having difficulty due to the lack of having her dentures, glasses, and having low back pain. Continues to complain that the seats in the day room are very hard and she is having difficulty dealing with them.  Continues to deny SI/HI/AVH, paranoia, ideas of reference. Denies depressive symptoms. Denies side effect from gabapentin. Denies abdominal pain but still has some constipation today but no nausea/vomiting. Continuing miralax scheduled to help with bowel movement. Discussed we would attempt to obtain collateral again today for safety planning and discharge goals.  Patient appeared less irritable today after completing assessment.  Continues to be tearful and anxious but appears less internally preoccupied and more concrete regarding goal setting.  Collateral:  Again attempted to reach [name] (daughter) on the phone but phone number went to voicemail. Left HIPPA compliant voicemail. Will attempt at another time  Objective:  Chart Review Past 24 hours of patient's chart was reviewed.  Patient is compliant with scheduled meds. Required Agitation PRNs: none Per RN  notes, no documented behavioral issues and is  attending group.  Total Time spent with patient: 45 minutes  Past Psychiatric History:  Previous Psych Diagnoses: MDD, recurrent episode, severe with psychosis, GAD, social anxiety Prior inpatient treatment: endorses at [facility] Current/prior outpatient treatment:zyprexa, klonopin, hydroxyzine, trazodone Psychotherapy HM:CNOBSJ History of suicide:parasuicidal behavior but no suicide attempts History of homicide: denies Psychiatric medication history:endorses Psychiatric medication compliance history:compliant while here Neuromodulation history: denies Current Psychiatrist:none Current therapist: none   Past Medical History:  Past Medical History:  Diagnosis Date   ADHD (attention deficit hyperactivity disorder)    Anxiety    Depression    Hx of staphylococcal septicemia     Past Surgical History:  Procedure Laterality Date   ABDOMINAL HYSTERECTOMY     BACK SURGERY     left elbow surgery     VIDEO BRONCHOSCOPY N/A 01/27/2013   Procedure: VIDEO BRONCHOSCOPY WITH FLUORO;  Surgeon: Elsie Stain, MD;  Location: [facility];  Service: Cardiopulmonary;  Laterality: N/A;   Family History: History reviewed. No pertinent family history. Family Psychiatric  History: denies Social History:  Social History   Substance and Sexual Activity  Alcohol Use Not Currently     Social History   Substance and Sexual Activity  Drug Use Not Currently   Types: Marijuana    Social History   Socioeconomic History   Marital status: Widowed    Spouse name: Not on file   Number of children: Not on file   Years of education: Not on file   Highest education level: Not on file  Occupational History   Not on file  Tobacco Use   Smoking status: Former  Packs/day: 1.00    Years: 20.00    Total pack years: 20.00    Types: Cigarettes   Smokeless tobacco: Never  Vaping Use   Vaping Use: Never used  Substance and Sexual Activity    Alcohol use: Not Currently   Drug use: Not Currently    Types: Marijuana   Sexual activity: Not on file  Other Topics Concern   Not on file  Social History Narrative   Not on file   Social Determinants of Health   Financial Resource Strain: Not on file  Food Insecurity: Unknown (09/05/2022)   Hunger Vital Sign    Worried About Running Out of Food in the Last Year: Patient refused    Ran Out of Food in the Last Year: Patient refused  Transportation Needs: Unknown (09/05/2022)   [place] - Hydrologist (Medical): Patient refused    Lack of Transportation (Non-Medical): Patient refused  Physical Activity: Not on file  Stress: Not on file  Social Connections: Not on file   Additional Social History:                         Current Medications: Current Facility-Administered Medications  Medication Dose Route Frequency Provider Last Rate Last Admin   acetaminophen (TYLENOL) tablet 650 mg  650 mg Oral Q6H PRN Rolanda Lundborg, MD   650 mg at 09/08/22 1717   alum & mag hydroxide-simeth (MAALOX/MYLANTA) 200-200-20 MG/5ML suspension 30 mL  30 mL Oral Q4H PRN Rolanda Lundborg, MD       clonazePAM Bobbye [place]) tablet 0.5 mg  0.5 mg Oral BID Rolanda Lundborg, MD   0.5 mg at 09/10/22 0824   feeding supplement (ENSURE ENLIVE / ENSURE PLUS) liquid 237 mL  237 mL Oral TID BM France Ravens, MD   237 mL at 09/10/22 1000   gabapentin (NEURONTIN) capsule 300 mg  300 mg Oral TID France Ravens, MD   300 mg at 09/10/22 1153   hydrOXYzine (ATARAX) tablet 25 mg  25 mg Oral TID PRN Rolanda Lundborg, MD   25 mg at 09/09/22 2038   magnesium hydroxide (MILK OF MAGNESIA) suspension 30 mL  30 mL Oral Daily PRN Rolanda Lundborg, MD   30 mL at 09/09/22 1828   OLANZapine (ZYPREXA) tablet 5 mg  5 mg Oral Daily Rolanda Lundborg, MD   5 mg at 09/10/22 8299   And   OLANZapine (ZYPREXA) tablet 10 mg  10 mg Oral QHS Rolanda Lundborg, MD   10 mg at 09/09/22 2036   ondansetron (ZOFRAN-ODT)  disintegrating tablet 4 mg  4 mg Oral Q8H PRN France Ravens, MD   4 mg at 09/06/22 1138   polyethylene glycol (MIRALAX / GLYCOLAX) packet 17 g  17 g Oral BID France Ravens, MD       senna Donavan Burnet) tablet 17.2 mg  2 tablet Oral Daily France Ravens, MD   17.2 mg at 09/10/22 0824   traZODone (DESYREL) tablet 50 mg  50 mg Oral QHS PRN France Ravens, MD   50 mg at 09/09/22 2037    Lab Results:  No results found for this or any previous visit (from the past 24 hour(s)).  Blood Alcohol level:  Lab Results  Component Value Date   [facility] <10 08/26/2022   ETH <10 37/16/9678    Metabolic Disorder Labs: Lab Results  Component Value Date   HGBA1C 5.0 09/02/2022   MPG 96.8 09/02/2022   MPG 96.8 08/08/2022  Lab Results  Component Value Date   PROLACTIN 19.2 11/13/2020   Lab Results  Component Value Date   CHOL 180 09/02/2022   TRIG 38 09/02/2022   HDL 68 09/02/2022   CHOLHDL 2.6 09/02/2022   VLDL 8 09/02/2022   LDLCALC 104 (H) 09/02/2022   LDLCALC 133 (H) 08/08/2022    Physical Findings: AIMS: 0, no cogwheeling rigidity, no abnormal facial movements  Musculoskeletal: Strength & Muscle Tone: within normal limits Gait & Station: normal  Psychiatric Specialty Exam:  Presentation  General Appearance: Appropriate for Environment; Casual   Eye Contact:Good   Speech:Clear and Coherent; Normal Rate   Speech Volume:Normal   Handedness:Right    Mood and Affect  Mood:Anxious; Depressed   Affect:Appropriate; Congruent    Thought Process  Thought Processes:Coherent; Goal Directed; Linear   Descriptions of Associations:Intact   Orientation:Full (Time, Place and Person)   Thought Content:Logical   History of Schizophrenia/Schizoaffective disorder:No   Duration of Psychotic Symptoms:Less than six months   Hallucinations:Hallucinations: None   Ideas of Reference:None   Suicidal Thoughts:Suicidal Thoughts: No   Homicidal Thoughts:Homicidal Thoughts:  No    Sensorium  Memory:Immediate Good; Recent Good; Remote Good   Judgment:Fair   Insight:Fair    Executive Functions  Concentration:Good   Attention Span:Good   [place] of Knowledge:Good   Language:Good    Psychomotor Activity  Psychomotor Activity:Psychomotor Activity: Normal    Assets  Assets:Communication Skills; Desire for Improvement; Physical Health    Sleep  Sleep:Sleep: Good     Physical Exam: Review of Systems  Respiratory:  Negative for shortness of breath.   Cardiovascular:  Negative for chest pain.  Gastrointestinal:  Positive for constipation. Negative for abdominal pain, diarrhea, heartburn, nausea and vomiting.  Neurological:  Negative for headaches.   Blood pressure 122/83, pulse 94, temperature 97.8 F (36.6 C), temperature source Oral, resp. rate 20, height '5\' 4"'$  (1.626 m), weight 55.3 kg, SpO2 97 %. Body mass index is 20.94 kg/m.   ASSESSMENT AND PLAN Bethany R Dollens is a 51 y.o. female with psychiatric hx of bipolar disorder, GAD ADHD, cannabis use disorder, sedative use disorder, and cocaine use disorder who [facility] after being medically cleared from acute aphasia and mutism for continued psychosis.   PLAN Safety and Monitoring: Involuntary admission to inpatient psychiatric unit for safety, stabilization and treatment Daily contact with patient to assess and evaluate symptoms and progress in treatment Patient's case to be discussed in multi-disciplinary team meeting Observation Level : q15 minute checks Vital signs: q12 hours Precautions: suicide, elopement, and assault   Psychiatric Problems MDD recurrent severe with psychosis (r/o SIMD, r/o substance induced psychosis, r/o acute benzodiazepines withdrawal delirium) GAD by hx Social anxiety d/o by hx -Continue Zyprexa 5 mg in the morning and 10 mg at night for psychosis -Continue Klonopin 0.5 mg twice daily for severe anxiety and benzodiazepine  misuse -Continue gabapentin to 300 mg 3 times daily for anxiety and neuropathic pain -- Metabolic profile and EKG monitoring obtained while on an atypical antipsychotic (BMI: 20.60 Lipid Panel: LDL cholesterol 104, HbgA1c: 5.0 QTc: 457) -- Encouraged patient to participate in unit milieu and in scheduled group therapies    3. Medical Management Constipation, improving Nausea/vomiting, resolved Patient states that this is due to medications but appears to be more consistent with severe anxiety and food intake. Poor historian regarding this. -Zofran PRN -Miralax+Senokot scheduled -Monitor as needed   Protein-calorie malnutrition  -Increase ensure to TID for additional nutritional support -Encourage  PO intake -daily weights   PRN The following PRN medications were added to ensure patient can focus on treatment. These were discussed with patient and patient aware of ability to ask for the following medications:  -Tylenol 650 mg q6hr PRN for mild pain -Mylanta 30 ml suspension for indigestion -Milk of Magnesia 30 ml for constipation -Trazodone 50 mg qhs for insomnia -Hydroxyzine 25 mg tid PRN for anxiety   4. Discharge Planning Patient will require the following based on my assessment:  Greatly appreciate CSW and Case management assistance with facilitating these needs and any further recommendations regarding patient's needs upon discharge. Estimated LOS: 5 days Discharge Concerns: Need to establish a safety plan; Medication compliance and effectiveness Discharge Goals: Return home with outpatient referrals for mental health follow-up including medication management/psychotherapy   France Ravens, MD 09/10/2022, 1:43 PM

## 2022-09-10 NOTE — Progress Notes (Signed)
Adult Psychoeducational Group Note  Date:  09/10/2022 Time:  8:47 PM  Group Topic/Focus:  Wrap-Up Group:   The focus of this group is to help patients review their daily goal of treatment and discuss progress on daily workbooks.  Participation Level:  Active  Participation Quality:  Appropriate  Affect:  Appropriate  Cognitive:  Appropriate  Insight: Appropriate  Engagement in Group:  Improving  Modes of Intervention:  Discussion  Additional Comments:  Pt stated her goal for today was to focus on her treatment plan and talk with her doctor about her discharge plan. Pt stated she accomplished her goals today. Pt stated she talked with her doctor and with her social worker about her care today. Pt rated her overall day a 9 out of 10. Pt stated she was able to talked with her social worker about her aftercare plan and gain clarity along with tons of information concerning treatment programs. Pt stated the plan is for her to discharge on 09/11/22. Pt stated she was able to contact her daughter today which improved her overall day. Pt stated she felt better about herself tonight. Pt stated she was able to attend all meals today. Pt stated she took all medications provided today. Pt stated her appetite was pretty good today. Pt rated her sleep last night was pretty good. Pt stated the goal tonight was to get some rest. Pt stated she had no physical pain tonight.  Pt deny visual hallucinations and auditory issues tonight. Pt denies thoughts of harming herself or others. Pt stated she would alert staff if anything changed   Candy Sledge 09/10/2022, 8:47 PM

## 2022-09-10 NOTE — [facility] Counselor (Signed)
[facility] LCSW Progress Note   09/10/2022    2:12 PM  [name]   [id number]   Type of Contact and Topic:  Public librarian, Transportation and [employer]  CSW met with patient who discussed stressors regarding losing her housing.  Patient discussed needing financial assistance with rent.  CSW provided a list of programs, churches and non profits who have offered financial assistance in the past.  CSW also provided a list of free meals in the [place] area and a list of food pantry's in the [employer]  Patient also provided [employer] bus passes and [place] bus passes to assist with getting around.  Patient was very appreciative of resources in the community.    Signed:  Riki Altes MSW, LCSW, LCAS 09/10/2022 2:12 PM

## 2022-09-10 NOTE — Progress Notes (Signed)
   09/10/22 2000  Psych Admission Type (Psych Patients Only)  Admission Status Involuntary  Psychosocial Assessment  Patient Complaints None  Eye Contact Fair  Facial Expression Anxious  Affect Appropriate to circumstance  Speech Logical/coherent  Interaction Assertive  Motor Activity Slow  Appearance/Hygiene Unremarkable  Behavior Characteristics Cooperative  Mood Anxious;Pleasant  Aggressive Behavior  Effect No apparent injury  Thought Process  Coherency WDL  Content WDL  Delusions WDL  Perception WDL  Hallucination None reported or observed  Judgment Impaired  Confusion None  Danger to Self  Current suicidal ideation? Denies

## 2022-09-11 DIAGNOSIS — F29 Unspecified psychosis not due to a substance or known physiological condition: Secondary | ICD-10-CM

## 2022-09-11 NOTE — Discharge Summary (Signed)
Physician Discharge Summary Note  Patient:  [name] is an 51 y.o., female MRN:  [id number] DOB:  1971-03-19 Patient phone:  [phone] (home)  Patient address:   [place] [address],  Total Time spent with patient: 45 minutes  Date of Admission:  09/05/2022 Date of Discharge: 09/11/2022  Reason for Admission:   [name] is a 51 y.o. female with psychiatric hx of bipolar disorder, GAD ADHD, cannabis use disorder, sedative use disorder, and cocaine use disorder who returns to [facility] after being medically cleared from acute aphasia and mutism for continued psychosis.     Principal Problem: Psychosis [facility]) Discharge Diagnoses: Principal Problem:   Psychosis ([place])    Past Psychiatric History:  Previous Psych Diagnoses: MDD, recurrent episode, severe with psychosis, GAD, social anxiety Prior inpatient treatment: endorses at [facility] Current/prior outpatient treatment:zyprexa, klonopin, hydroxyzine, trazodone Psychotherapy RC:VELFYB History of suicide:parasuicidal behavior but no suicide attempts History of homicide: denies Psychiatric medication history:endorses Psychiatric medication compliance history:compliant while here Neuromodulation history: denies Current Psychiatrist:none Current therapist: none  Past Medical History:  Past Medical History:  Diagnosis Date   ADHD (attention deficit hyperactivity disorder)    Anxiety    Depression    Hx of staphylococcal septicemia     Past Surgical History:  Procedure Laterality Date   ABDOMINAL HYSTERECTOMY     BACK SURGERY     left elbow surgery     VIDEO BRONCHOSCOPY N/A 01/27/2013   Procedure: VIDEO BRONCHOSCOPY WITH FLUORO;  Surgeon: Elsie Stain, MD;  Location: [facility];  Service: Cardiopulmonary;  Laterality: N/A;   Family History: History reviewed. No pertinent family history.  Social History:  Social History   Substance and Sexual Activity  Alcohol Use Not Currently      Social History   Substance and Sexual Activity  Drug Use Not Currently   Types: Marijuana    Social History   Socioeconomic History   Marital status: Widowed    Spouse name: Not on file   Number of children: Not on file   Years of education: Not on file   Highest education level: Not on file  Occupational History   Not on file  Tobacco Use   Smoking status: Former    Packs/day: 1.00    Years: 20.00    Total pack years: 20.00    Types: Cigarettes   Smokeless tobacco: Never  Vaping Use   Vaping Use: Never used  Substance and Sexual Activity   Alcohol use: Not Currently   Drug use: Not Currently    Types: Marijuana   Sexual activity: Not on file  Other Topics Concern   Not on file  Social History Narrative   Not on file   Social Determinants of Health   Financial Resource Strain: Not on file  Food Insecurity: Unknown (09/05/2022)   Hunger Vital Sign    Worried About Running Out of Food in the Last Year: Patient refused    Ran Out of Food in the Last Year: Patient refused  Transportation Needs: Unknown (09/05/2022)   PRAPARE - Hydrologist (Medical): Patient refused    Lack of Transportation (Non-Medical): Patient refused  Physical Activity: Not on file  Stress: Not on file  Social Connections: Not on file    Hospital Course:   During the patient's hospitalization, patient had extensive initial psychiatric evaluation, and follow-up psychiatric evaluations every day.   Psychiatric diagnoses provided upon initial assessment:  Unspecified  schizophrenia spectrum and other psychotic disorder GAD Social anxiety   Patient's psychiatric medications were adjusted on admission:  -Continue Zyprexa 5 mg in the morning and 10 mg at night for psychosis -Continue Klonopin 0.5 mg twice daily for severe anxiety and benzodiazepine misuse -Restart gabapentin 100 mg 3 times daily for anxiety and neuropathic pain -Hydroxyzine 25 mg tid prn for  anxiety -Trazodone 50 mg qhs prn for insomnia   During the hospitalization, other adjustments were made to the patient's psychiatric medication regimen:  -Titrated gabapentin up to 300 mg tid for refractory anxiety   Gradually, patient started adjusting to milieu.   Patient's care was discussed during the interdisciplinary team meeting every day during the hospitalization.   The patient denies having side effects to prescribed psychiatric medication.   The patient reports their target psychiatric symptoms of anxiety and psychosis responded well to the psychiatric medications, and the patient reports overall benefit other psychiatric hospitalization. Supportive psychotherapy was provided to the patient. The patient also participated in regular group therapy while admitted.    Labs were reviewed with the patient, and abnormal results were discussed with the patient.   The patient denied having suicidal thoughts more than 48 hours prior to discharge.  Patient denies having homicidal thoughts.  Patient denies having auditory hallucinations.  Patient denies any visual hallucinations.  Patient denies having paranoid thoughts.   The patient is able to verbalize their individual safety plan to this provider.   It is recommended to the patient to continue psychiatric medications as prescribed, after discharge from the hospital.     It is recommended to the patient to follow up with your outpatient psychiatric provider and PCP.   Discussed with the patient, the impact of alcohol, drugs, tobacco have been there overall psychiatric and medical wellbeing, and total abstinence from substance use was recommended the patient.  Physical Findings: AIMS: 0, no cogwheeling, no abnormal facial movements  Musculoskeletal: Strength & Muscle Tone: within normal limits Gait & Station: normal Patient leans: N/A   Psychiatric Specialty Exam:  Presentation  General Appearance: Appropriate for Environment;  Casual   Eye Contact:Good   Speech:Clear and Coherent; Normal Rate   Speech Volume:Normal   Handedness:Right    Mood and Affect  Mood:Anxious   Affect:Appropriate; Congruent    Thought Process  Thought Processes:Coherent; Goal Directed; Linear   Descriptions of Associations:Intact   Orientation:Full (Time, Place and Person)   Thought Content:Logical   History of Schizophrenia/Schizoaffective disorder:No   Duration of Psychotic Symptoms:Less than six months   Hallucinations:Hallucinations: None   Ideas of Reference:None   Suicidal Thoughts:Suicidal Thoughts: No   Homicidal Thoughts:Homicidal Thoughts: No    Sensorium  Memory:Immediate Good; Recent Good; Remote Good   Judgment:Fair   Insight:Fair    Executive Functions  Concentration:Good   Attention Span:Good   [place] of Knowledge:Good   Language:Good    Psychomotor Activity  Psychomotor Activity:Psychomotor Activity: Normal    Assets  Assets:Communication Skills; Desire for Improvement; Physical Health    Sleep  Sleep:Sleep: Good     Physical Exam: Physical Exam Vitals and nursing note reviewed.  Constitutional:      Appearance: Normal appearance. She is normal weight.  HENT:     Head: Normocephalic and atraumatic.  Pulmonary:     Effort: Pulmonary effort is normal.  Neurological:     General: No focal deficit present.     Mental Status: She is oriented to person, place, and time.    Review of  Systems  Respiratory:  Negative for shortness of breath.   Cardiovascular:  Negative for chest pain.  Gastrointestinal:  Negative for abdominal pain, constipation, diarrhea, heartburn, nausea and vomiting.  Neurological:  Negative for headaches.   Blood pressure (!) 123/92, pulse (!) 101, temperature 97.9 F (36.6 C), temperature source Oral, resp. rate 20, height '5\' 4"'$  (1.626 m), weight 57 kg, SpO2 99 %. Body mass index is 21.56  kg/m.   Social History   Tobacco Use  Smoking Status Former   Packs/day: 1.00   Years: 20.00   Total pack years: 20.00   Types: Cigarettes  Smokeless Tobacco Never   Tobacco Cessation:  N/A, patient does not currently use tobacco products   Blood Alcohol level:  Lab Results  Component Value Date   ETH <10 08/26/2022   ETH <10 79/01/4096    Metabolic Disorder Labs:  Lab Results  Component Value Date   HGBA1C 5.0 09/02/2022   MPG 96.8 09/02/2022   MPG 96.8 08/08/2022   Lab Results  Component Value Date   PROLACTIN 19.2 11/13/2020   Lab Results  Component Value Date   CHOL 180 09/02/2022   TRIG 38 09/02/2022   HDL 68 09/02/2022   CHOLHDL 2.6 09/02/2022   VLDL 8 09/02/2022   LDLCALC 104 (H) 09/02/2022   LDLCALC 133 (H) 08/08/2022    See Psychiatric Specialty Exam and Suicide Risk Assessment completed by Attending Physician prior to discharge.  Discharge destination:  Home  Is patient on multiple antipsychotic therapies at discharge:  No   Has Patient had three or more failed trials of antipsychotic monotherapy by history:  No  Recommended Plan for Multiple Antipsychotic Therapies: NA   Allergies as of 09/11/2022       Reactions   Penicillins Other (See Comments)   Childhood reaction- exact reaction not cited        Medication List     TAKE these medications      Indication  clonazePAM 0.5 MG tablet Commonly known as: KLONOPIN Take 1 tablet (0.5 mg total) by mouth 2 (two) times daily.  Indication: Feeling Anxious   gabapentin 300 MG capsule Commonly known as: NEURONTIN Take 1 capsule (300 mg total) by mouth 3 (three) times daily.  Indication: Generalized Anxiety Disorder, Neuropathic Pain   hydrOXYzine 25 MG tablet Commonly known as: ATARAX Take 1 tablet (25 mg total) by mouth 3 (three) times daily as needed for anxiety.  Indication: Feeling Anxious   OLANZapine 10 MG tablet Commonly known as: ZYPREXA Take 1 tablet (10 mg total) by  mouth at bedtime.  Indication: Psychotic Depressive Illness   OLANZapine 5 MG tablet Commonly known as: ZYPREXA Take 1 tablet (5 mg total) by mouth daily.  Indication: MIXED BIPOLAR AFFECTIVE DISORDER   senna 8.6 MG Tabs tablet Commonly known as: SENOKOT Take 2 tablets (17.2 mg total) by mouth daily. What changed: how much to take  Indication: Constipation   traZODone 50 MG tablet Commonly known as: DESYREL Take 1 tablet (50 mg total) by mouth at bedtime as needed for sleep.  Indication: [place]. Go to.   Specialty: Behavioral Health Why: We were unable to contact this provider to obtain set appointments prior to discharge.  Please go to this provider for an assessment, to obtain therapy and medication management services on Monday or Wednesday morning, arrive by 7:30 am. Services are provided on a first  come, first served basis. Contact information: [place] [phone]        Primary Care at [facility]. Go on 10/08/2022.   Specialty: Family Medicine Why: You have a hospital follow up appointment for primary care services on 10/08/22 at 9:45 am  with Dorna Mai.  This appointment will be held in person. Contact information: [address], [place] [phone]        Services, [place] Follow up.   Specialty: Behavioral Health Why: A referral has been made for you to recieve community support team services. Someone from the agency will be reaching out to you soon. If you would like, you can call the number provided. Contact information: [address] [place] [address] [phone]                  Follow-up recommendations:   Activity:  as tolerated Diet:  heart healthy   Comments:  Prescriptions were given at discharge.  Patient is agreeable with the discharge  plan.  Patient was given an opportunity to ask questions.  Patient appears to feel comfortable with discharge and denies any current suicidal or homicidal thoughts.    Patient is instructed prior to discharge to: Take all medications as prescribed by mental healthcare provider. Report any adverse effects and or reactions from the medicines to outpatient provider promptly. In the event of worsening symptoms, patient is instructed to call the crisis hotline, 911 and or go to the nearest ED for appropriate evaluation and treatment of symptoms. Patient is to follow-up with primary care provider for other medical issues, concerns and or health care needs.   Signed: France Ravens, MD 09/11/2022, 7:26 AM

## 2022-09-11 NOTE — Progress Notes (Signed)
Pt discharged to lobby. Pt was stable and appreciative at that time. All papers, suicide safety plan,  and prescriptions were given and valuables returned. Verbal understanding expressed. Denies SI/HI and A/VH. Pt given opportunity to express concerns and ask questions.

## 2022-09-11 NOTE — Progress Notes (Signed)
  [facility] Adult Case Management Discharge Plan :  Will you be returning to the same living situation after discharge:  Yes,  home  At discharge, do you have transportation home?: Yes,  daughter can pick up at 3:30pm Do you have the ability to pay for your medications: Yes,  insurance  Release of information consent forms completed and in the chart;  Patient's signature needed at discharge.  Patient to Follow up at:  [facility]. Go to.   Specialty: Behavioral Health Why: We were unable to contact this provider to obtain set appointments prior to discharge.  Please go to this provider for an assessment, to obtain therapy and medication management services on Monday or Wednesday morning, arrive by 7:30 am. Services are provided on a first come, first served basis. Contact information: [place] [phone]        Primary Care at [facility]. Go on 10/08/2022.   Specialty: Family Medicine Why: You have a hospital follow up appointment for primary care services on 10/08/22 at 9:45 am  with Dorna Mai.  This appointment will be held in person. Contact information: [address] [place] [phone]        Services, [place] Follow up.   Specialty: Behavioral Health Why: A referral has been made for you to recieve community support team services. Someone from the agency will be reaching out to you soon. If you would like, you can call the number provided. Contact information: [place] [address] [phone]                 Next level of care provider has access to [facility] and Suicide Prevention discussed: Yes,  daughter,  [name] (daughter)  [phone]     Has patient been referred to the Quitline?: N/A patient is not a smoker  Patient has been referred for  addiction treatment: [place], LCSW 09/11/2022, 9:53 AM

## 2022-09-11 NOTE — Progress Notes (Signed)
   09/11/22 0515  Sleep  Number of Hours 8

## 2022-09-11 NOTE — Progress Notes (Signed)
Recreation Therapy Notes  INPATIENT RECREATION TR PLAN  Patient Details Name: [name] MRN: [id number] DOB: 17-May-1971 Today's Date: 09/11/2022  Rec Therapy Plan Is patient appropriate for Therapeutic Recreation?: Yes Treatment times per week: about 3 days Estimated Length of Stay: 5-7 days TR Treatment/Interventions: Group participation (Comment)  Discharge Criteria Pt will be discharged from therapy if:: Discharged Treatment plan/goals/alternatives discussed and agreed upon by:: Patient/family  Discharge Summary Short term goals set: See patient care plan Short term goals met: Adequate for discharge Progress toward goals comments: Groups attended Which groups?: Communication, Other (Comment) (Team Building) Reason goals not met: Patient is showing improvement Therapeutic equipment acquired: N/A Reason patient discharged from therapy: Discharge from hospital Pt/family agrees with progress & goals achieved: Yes Date patient discharged from therapy: 09/11/22   Yuritza Paulhus-McCall, LRT,CTRS Abdulah Iqbal A Srinika Delone-McCall 09/11/2022, 1:12 PM

## 2022-09-11 NOTE — Plan of Care (Signed)
Patient showed some improvement in identifying coping skills strategies and how to implement them at completion of recreation therapy group sessions.   Marisha Renier-McCall, LRT,CTRS

## 2022-09-11 NOTE — Group Note (Unsigned)
Date:  09/11/2022 Time:  11:02 AM  Group Topic/Focus:  Goals Group:   The focus of this group is to help patients establish daily goals to achieve during treatment and discuss how the patient can incorporate goal setting into their daily lives to aide in recovery. Orientation:   The focus of this group is to educate the patient on the purpose and policies of crisis stabilization and provide a format to answer questions about their admission.  The group details unit policies and expectations of patients while admitted.     Participation Level:  {BHH PARTICIPATION CVELF:81017}  Participation Quality:  {BHH PARTICIPATION QUALITY:22265}  Affect:  {BHH AFFECT:22266}  Cognitive:  {BHH COGNITIVE:22267}  Insight: {BHH Insight2:20797}  Engagement in Group:  {BHH ENGAGEMENT IN PZWCH:85277}  Modes of Intervention:  {BHH MODES OF INTERVENTION:22269}  Additional Comments:  ***  Garvin Fila 09/11/2022, 11:02 AM

## 2022-09-11 NOTE — BHH Suicide Risk Assessment (Addendum)
[facility] Discharge Suicide Risk Assessment   Principal Problem: Psychosis [facility]) Discharge Diagnoses: Principal Problem:   Psychosis ([place])   Reason for Admission:  [name] is a 51 y.o. female with psychiatric hx of bipolar disorder, GAD ADHD, cannabis use disorder, sedative use disorder, and cocaine use disorder who returned to [facility] after being medically cleared from acute aphasia and mutism for continued psychosis.   Hospital Summary During the patient's hospitalization, patient had extensive initial psychiatric evaluation, and follow-up psychiatric evaluations every day.  Psychiatric diagnoses provided upon initial assessment:  Unspecified schizophrenia spectrum and other psychotic disorder GAD Social anxiety  Patient's psychiatric medications were adjusted on admission:  -Continue Zyprexa 5 mg in the morning and 10 mg at night for psychosis -Continue Klonopin 0.5 mg twice daily for severe anxiety and benzodiazepine misuse -Restart gabapentin 100 mg 3 times daily for anxiety and neuropathic pain -Hydroxyzine 25 mg tid prn for anxiety -Trazodone 50 mg qhs prn for insomnia  During the hospitalization, other adjustments were made to the patient's psychiatric medication regimen:  -Titrated gabapentin up to 300 mg tid for refractory anxiety  Gradually, patient started adjusting to milieu.   Patient's care was discussed during the interdisciplinary team meeting every day during the hospitalization.  The patient denies having side effects to prescribed psychiatric medication.  The patient reports their target psychiatric symptoms of anxiety and psychosis responded well to the psychiatric medications, and the patient reports overall benefit other psychiatric hospitalization. Supportive psychotherapy was provided to the patient. The patient also participated in regular group therapy while admitted.   Labs were reviewed with the patient, and abnormal results were discussed with the  patient.  The patient denied having suicidal thoughts more than 48 hours prior to discharge.  Patient denies having homicidal thoughts.  Patient denies having auditory hallucinations.  Patient denies any visual hallucinations.  Patient denies having paranoid thoughts.  The patient is able to verbalize their individual safety plan to this provider.  It is recommended to the patient to continue psychiatric medications as prescribed, after discharge from the hospital.    It is recommended to the patient to follow up with your outpatient psychiatric provider and PCP.  Discussed with the patient, the impact of alcohol, drugs, tobacco have been there overall psychiatric and medical wellbeing, and total abstinence from substance use was recommended the patient.   Total Time spent with patient: 45 minutes  Musculoskeletal: Strength & Muscle Tone: within normal limits Gait & Station: normal Patient leans: N/A  Psychiatric Specialty Exam  Presentation  General Appearance: Appropriate for Environment; Casual   Eye Contact:Good   Speech:Clear and Coherent; Normal Rate   Speech Volume:Normal   Handedness:Right    Mood and Affect  Mood:Anxious   Duration of Depression Symptoms: No data recorded  Affect:Appropriate; Congruent    Thought Process  Thought Processes:Coherent; Goal Directed; Linear   Descriptions of Associations:Intact   Orientation:Full (Time, Place and Person)   Thought Content:Logical   History of Schizophrenia/Schizoaffective disorder:No   Duration of Psychotic Symptoms:Less than six months   Hallucinations:Hallucinations: None  Ideas of Reference:None   Suicidal Thoughts:Suicidal Thoughts: No  Homicidal Thoughts:Homicidal Thoughts: No   Sensorium  Memory:Immediate Good; Recent Good; Remote Good   Judgment:Fair   Insight:Fair    Executive Functions  Concentration:Good   Attention Span:Good   [place] of  Knowledge:Good   Language:Good    Psychomotor Activity  Psychomotor Activity:Psychomotor Activity: Normal   Assets  Assets:Communication Skills; Desire for Improvement; Physical  Health    Sleep  Sleep:Sleep: Good   Physical Exam: Physical Exam Vitals and nursing note reviewed.  Constitutional:      Appearance: Normal appearance. She is normal weight.  HENT:     Head: Normocephalic and atraumatic.  Pulmonary:     Effort: Pulmonary effort is normal.  Neurological:     General: No focal deficit present.     Mental Status: She is oriented to person, place, and time.    Review of Systems  Respiratory:  Negative for shortness of breath.   Cardiovascular:  Negative for chest pain.  Gastrointestinal:  Negative for abdominal pain, constipation, diarrhea, heartburn, nausea and vomiting.  Neurological:  Negative for headaches.   Blood pressure (!) 123/92, pulse (!) 101, temperature 97.9 F (36.6 C), temperature source Oral, resp. rate 20, height '5\' 4"'$  (1.626 m), weight 57 kg, SpO2 99 %. Body mass index is 21.56 kg/m.  Mental Status Per Nursing Assessment::    Demographic Factors:  Divorced or widowed, Living alone, and Unemployed  Loss Factors: Decrease in vocational status and Financial problems/change in socioeconomic status  Historical Factors: Impulsivity  Risk Reduction Factors:   Positive social support, Positive therapeutic relationship, and Positive coping skills or problem solving skills  Continued Clinical Symptoms:  Depression:   Severe Alcohol/Substance Abuse/Dependencies Previous Psychiatric Diagnoses and Treatments  Cognitive Features That Contribute To Risk:  None    Suicide Risk:  Mild: Given recent psychosis and depression and h/o substance use; no current suicidal ideation, intent or plan with stable and improved mood.   [place]. Go to.   Specialty: Behavioral Health Why: We were  unable to contact this provider to obtain set appointments prior to discharge.  Please go to this provider for an assessment, to obtain therapy and medication management services on Monday or Wednesday morning, arrive by 7:30 am. Services are provided on a first come, first served basis. Contact information: [place] [phone]        Primary Care at [facility]. Go on 10/08/2022.   Specialty: Family Medicine Why: You have a hospital follow up appointment for primary care services on 10/08/22 at 9:45 am  with Dorna Mai.  This appointment will be held in person. Contact information: [address] [place] [phone]        Services, [place] Follow up.   Specialty: Behavioral Health Why: A referral has been made for you to recieve community support team services. Someone from the agency will be reaching out to you soon. If you would like, you can call the number provided. Contact information: [address] [place] [address] [phone]                 Plan Of Care/Follow-up recommendations:  Activity: as tolerated  Diet: heart healthy  Other: -Follow-up with your outpatient psychiatric provider -instructions on appointment date, time, and address (location) are provided to you in discharge paperwork.  -Take your psychiatric medications as prescribed at discharge - instructions are provided to you in the discharge paperwork  -Follow-up with outpatient primary care doctor and other specialists -for management of chronic medical disease, including: Protein-calorie malnutrition    -Testing: Follow-up with outpatient provider for abnormal lab results: fasting lipid panel (elevated LDL cholesterol)  -Recommend abstinence from alcohol, tobacco, and other illicit drug use at discharge.   -If your psychiatric symptoms recur, worsen,  or if you have side effects to  your psychiatric medications, call your outpatient psychiatric provider, 911, [phone] or go to the nearest emergency department.  -If suicidal thoughts recur, call your outpatient psychiatric provider, 911, [phone] or go to the nearest emergency department.   France Ravens, MD 09/11/2022, 7:26 AM

## 2022-10-08 ENCOUNTER — Ambulatory Visit (INDEPENDENT_AMBULATORY_CARE_PROVIDER_SITE_OTHER): Payer: Medicare HMO | Admitting: Family Medicine

## 2022-10-08 ENCOUNTER — Encounter: Payer: Self-pay | Admitting: Family Medicine

## 2022-10-08 VITALS — BP 112/81 | HR 79 | Temp 98.1°F | Resp 18 | Ht 65.0 in | Wt 130.8 lb

## 2022-10-08 DIAGNOSIS — Z7689 Persons encountering health services in other specified circumstances: Secondary | ICD-10-CM

## 2022-10-08 DIAGNOSIS — M5442 Lumbago with sciatica, left side: Secondary | ICD-10-CM | POA: Insufficient documentation

## 2022-10-08 DIAGNOSIS — M5412 Radiculopathy, cervical region: Secondary | ICD-10-CM | POA: Insufficient documentation

## 2022-10-08 DIAGNOSIS — M542 Cervicalgia: Secondary | ICD-10-CM | POA: Insufficient documentation

## 2022-10-08 DIAGNOSIS — M7918 Myalgia, other site: Secondary | ICD-10-CM | POA: Insufficient documentation

## 2022-10-08 DIAGNOSIS — F333 Major depressive disorder, recurrent, severe with psychotic symptoms: Secondary | ICD-10-CM | POA: Diagnosis not present

## 2022-10-08 NOTE — Progress Notes (Signed)
New Patient Office Visit  Subjective    Patient ID: [name], female    DOB: [birth year]  Age: 51 y.o. MRN: [id number]  CC:  Chief Complaint  Patient presents with   HFU    HPI [name] presents to establish care. She reports that she has extensive mental health issues but was having difficulty getting assistance as she did not have a PCP.    Outpatient Encounter Medications as of 10/08/2022  Medication Sig   clonazePAM (KLONOPIN) 0.5 MG tablet Take 1 tablet (0.5 mg total) by mouth 2 (two) times daily.   gabapentin (NEURONTIN) 300 MG capsule Take 1 capsule (300 mg total) by mouth 3 (three) times daily.   OLANZapine (ZYPREXA) 10 MG tablet Take 1 tablet (10 mg total) by mouth at bedtime.   OLANZapine (ZYPREXA) 5 MG tablet Take 1 tablet (5 mg total) by mouth daily.   senna (SENOKOT) 8.6 MG TABS tablet Take 2 tablets (17.2 mg total) by mouth daily.   traZODone (DESYREL) 50 MG tablet Take 1 tablet (50 mg total) by mouth at bedtime as needed for sleep.   hydrOXYzine (ATARAX) 25 MG tablet Take 1 tablet (25 mg total) by mouth 3 (three) times daily as needed for anxiety. (Patient not taking: Reported on 10/08/2022)   No facility-administered encounter medications on file as of 10/08/2022.    Past Medical History:  Diagnosis Date   ADHD (attention deficit hyperactivity disorder)    Anxiety    Depression    Hx of staphylococcal septicemia     Past Surgical History:  Procedure Laterality Date   ABDOMINAL HYSTERECTOMY     BACK SURGERY     left elbow surgery     VIDEO BRONCHOSCOPY N/A 01/27/2013   Procedure: VIDEO BRONCHOSCOPY WITH FLUORO;  Surgeon: Elsie Stain, MD;  Location: [facility];  Service: Cardiopulmonary;  Laterality: N/A;    History reviewed. No pertinent family history.  Social History   Socioeconomic History   Marital status: Widowed    Spouse name: Not on file   Number of children: Not on file   Years of education: Not on file   Highest  education level: Not on file  Occupational History   Not on file  Tobacco Use   Smoking status: Former    Packs/day: 1.00    Years: 20.00    Total pack years: 20.00    Types: Cigarettes   Smokeless tobacco: Never  Vaping Use   Vaping Use: Never used  Substance and Sexual Activity   Alcohol use: Not Currently   Drug use: Not Currently    Types: Marijuana   Sexual activity: Not on file  Other Topics Concern   Not on file  Social History Narrative   Not on file   Social Determinants of Health   Financial Resource Strain: Not on file  Food Insecurity: Unknown (09/05/2022)   Hunger Vital Sign    Worried About Running Out of Food in the Last Year: Patient refused    Ran Out of Food in the Last Year: Patient refused  Transportation Needs: Unknown (09/05/2022)   PRAPARE - Hydrologist (Medical): Patient refused    Lack of Transportation (Non-Medical): Patient refused  Physical Activity: Not on file  Stress: Not on file  Social Connections: Not on file  Intimate Partner Violence: Unknown (09/05/2022)   Humiliation, Afraid, Rape, and Kick questionnaire    Fear of Current or Ex-Partner: Patient refused  Emotionally Abused: Patient refused    Physically Abused: Patient refused    Sexually Abused: Patient refused    Review of Systems  Psychiatric/Behavioral:  Negative for suicidal ideas. The patient is nervous/anxious.   All other systems reviewed and are negative.       Objective    BP 112/81   Pulse 79   Temp 98.1 F (36.7 C)   Resp 18   Ht '5\' 5"'$  (1.651 m)   Wt 130 lb 12.8 oz (59.3 kg)   SpO2 94%   BMI 21.77 kg/m   Physical Exam Vitals and nursing note reviewed.  Constitutional:      General: She is not in acute distress. Cardiovascular:     Rate and Rhythm: Normal rate and regular rhythm.  Pulmonary:     Effort: Pulmonary effort is normal.     Breath sounds: Normal breath sounds.  Neurological:     General: No focal deficit  present.     Mental Status: She is alert and oriented to person, place, and time.  Psychiatric:        Mood and Affect: Mood is anxious.        Behavior: Behavior is hyperactive.         Assessment & Plan:   1. MDD (major depressive disorder), recurrent, severe, with psychosis ([place]) Management as per consultants  2. Encounter to establish care     Return in about 4 months (around 02/08/2023) for physical.   Kamilia Sax, MD

## 2022-10-10 ENCOUNTER — Ambulatory Visit (INDEPENDENT_AMBULATORY_CARE_PROVIDER_SITE_OTHER): Payer: Medicare HMO | Admitting: Psychiatry

## 2022-10-10 ENCOUNTER — Encounter (HOSPITAL_COMMUNITY): Payer: Self-pay | Admitting: Psychiatry

## 2022-10-10 VITALS — BP 119/70 | HR 62

## 2022-10-10 DIAGNOSIS — F410 Panic disorder [episodic paroxysmal anxiety] without agoraphobia: Secondary | ICD-10-CM | POA: Diagnosis not present

## 2022-10-10 DIAGNOSIS — F411 Generalized anxiety disorder: Secondary | ICD-10-CM | POA: Diagnosis not present

## 2022-10-10 DIAGNOSIS — F4010 Social phobia, unspecified: Secondary | ICD-10-CM | POA: Diagnosis not present

## 2022-10-10 DIAGNOSIS — F3341 Major depressive disorder, recurrent, in partial remission: Secondary | ICD-10-CM | POA: Diagnosis not present

## 2022-10-10 MED ORDER — OLANZAPINE 5 MG PO TABS: 5.0000 mg | ORAL_TABLET | Freq: Every day | ORAL | 2 refills | Status: DC

## 2022-10-10 MED ORDER — GABAPENTIN 300 MG PO CAPS: 300.0000 mg | ORAL_CAPSULE | Freq: Three times a day (TID) | ORAL | 2 refills | Status: DC

## 2022-10-10 MED ORDER — OLANZAPINE 10 MG PO TABS: 10.0000 mg | ORAL_TABLET | Freq: Every day | ORAL | 0 refills | Status: DC

## 2022-10-10 MED ORDER — HYDROXYZINE HCL 25 MG PO TABS: 25.0000 mg | ORAL_TABLET | Freq: Three times a day (TID) | ORAL | 1 refills | Status: DC | PRN

## 2022-10-10 MED ORDER — TRAZODONE HCL 50 MG PO TABS: 50.0000 mg | ORAL_TABLET | Freq: Every evening | ORAL | 2 refills | Status: DC | PRN

## 2022-10-10 NOTE — Progress Notes (Signed)
[place] MD/PA/NP OP Progress Note  10/10/2022 5:10 PM [name]  MRN:  [id number]  Chief Complaint:  Chief Complaint  Patient presents with   Follow-up   Anxiety    HPI:   [name] is a 51 year old female with a past psychiatric history significant for generalized anxiety disorder, social anxiety disorder, ADHD and panic disorder who presents to [facility] walk in for follow-up and medication management.  Patient was recently admitted to [facility] from 09/05/2022 -09/11/2022 due to bizarre behavior and her medications were adjusted at that time. Patient was discharged on olanzapine 10 mg nightly, olanzapine 5 mg daily, trazodone 50 mg nightly as needed for sleep, gabapentin 300 mg 3 times daily for anxiety, Klonopin 0.5 mg twice daily as needed for anxiety. She reports that she has been taking her medications regularly but did not know that she has to take olanzapine during the daytime also.   Patient reports that she was admitted to the hospital  because whenever she asks her son any questions he always IVC's her.  Reports that her depression has been much better after discharge and she does not have any psychosis but her anxiety has gotten worse.  She reports that recently she bought concert ticket and paid for the parking but could not go inside because of anxiety.  She reports that her mood is better now.  She reports that she she recently moved to new apartment and just getting adjusted to the new environment and people around her.  Patient is alert and oriented x4, pleasant, anxious, cooperative, and fully engaged in conversation during the encounter. Her mood is good and her affect is congruent and full range.  Patient denies suicidal or homicidal ideations.  She further denies auditory or visual hallucinations and does not appear to be responding to internal/external stimuli.  She denies any paranoia.  Patient endorses good sleep and receives on  average 8-9 hours of sleep each night.  Patient endorses good appetite.   Patient denies any illicit drug use.  Patient states that she uses nicotine vape 2-3 times daily.   She is on disability.  She is works Designer, television/film set for [employer].  She denies any other concerns.  Visit Diagnosis:    ICD-10-CM   1. GAD (generalized anxiety disorder)  F41.1 gabapentin (NEURONTIN) 300 MG capsule    2. Social anxiety disorder  F40.10     3. Panic disorder  F41.0     4. MDD (major depressive disorder), recurrent, in partial remission (HCC)  F33.41 traZODone (DESYREL) 50 MG tablet    OLANZapine (ZYPREXA) 10 MG tablet    OLANZapine (ZYPREXA) 5 MG tablet    hydrOXYzine (ATARAX) 25 MG tablet      Past Psychiatric History:  Attention deficit hyperactivity disorder Generalized anxiety disorder Social anxiety disorder Panic disorder  Past Medical History:  Past Medical History:  Diagnosis Date   ADHD (attention deficit hyperactivity disorder)    Anxiety    Depression    Hx of staphylococcal septicemia     Past Surgical History:  Procedure Laterality Date   ABDOMINAL HYSTERECTOMY     BACK SURGERY     left elbow surgery     VIDEO BRONCHOSCOPY N/A 01/27/2013   Procedure: VIDEO BRONCHOSCOPY WITH FLUORO;  Surgeon: Elsie Stain, MD;  Location: [facility] ENDOSCOPY;  Service: Cardiopulmonary;  Laterality: N/A;    Family Psychiatric History:  Patient believes that her mother may have had a psychiatric history  but she does not know the specifics.  Family History: No family history on file.  Social History:  Social History   Socioeconomic History   Marital status: Widowed    Spouse name: Not on file   Number of children: Not on file   Years of education: Not on file   Highest education level: Not on file  Occupational History   Not on file  Tobacco Use   Smoking status: Former    Packs/day: 1.00    Years: 20.00    Total pack years: 20.00    Types: Cigarettes   Smokeless  tobacco: Never  Vaping Use   Vaping Use: Never used  Substance and Sexual Activity   Alcohol use: Not Currently   Drug use: Not Currently    Types: Marijuana   Sexual activity: Not on file  Other Topics Concern   Not on file  Social History Narrative   Not on file   Social Determinants of Health   Financial Resource Strain: Not on file  Food Insecurity: Unknown (09/05/2022)   Hunger Vital Sign    Worried About Running Out of Food in the Last Year: Patient refused    Ran Out of Food in the Last Year: Patient refused  Transportation Needs: Unknown (09/05/2022)   PRAPARE - Hydrologist (Medical): Patient refused    Lack of Transportation (Non-Medical): Patient refused  Physical Activity: Not on file  Stress: Not on file  Social Connections: Not on file    Allergies:  Allergies  Allergen Reactions   Penicillins Other (See Comments)    Childhood reaction- exact reaction not cited    Metabolic Disorder Labs: Lab Results  Component Value Date   HGBA1C 5.0 09/02/2022   MPG 96.8 09/02/2022   MPG 96.8 08/08/2022   Lab Results  Component Value Date   PROLACTIN 19.2 11/13/2020   Lab Results  Component Value Date   CHOL 180 09/02/2022   TRIG 38 09/02/2022   HDL 68 09/02/2022   CHOLHDL 2.6 09/02/2022   VLDL 8 09/02/2022   LDLCALC 104 (H) 09/02/2022   LDLCALC 133 (H) 08/08/2022   Lab Results  Component Value Date   TSH 1.564 08/31/2022   TSH 1.455 08/08/2022    Therapeutic Level Labs: No results found for: "LITHIUM" No results found for: "VALPROATE" No results found for: "CBMZ"  Current Medications: Current Outpatient Medications  Medication Sig Dispense Refill   clonazePAM (KLONOPIN) 0.5 MG tablet Take 1 tablet (0.5 mg total) by mouth 2 (two) times daily. 60 tablet 0   gabapentin (NEURONTIN) 300 MG capsule Take 1 capsule (300 mg total) by mouth 3 (three) times daily. 90 capsule 2   hydrOXYzine (ATARAX) 25 MG tablet Take 1 tablet (25  mg total) by mouth 3 (three) times daily as needed for anxiety. 90 tablet 1   OLANZapine (ZYPREXA) 10 MG tablet Take 1 tablet (10 mg total) by mouth at bedtime. 30 tablet 0   OLANZapine (ZYPREXA) 5 MG tablet Take 1 tablet (5 mg total) by mouth daily. 30 tablet 2   senna (SENOKOT) 8.6 MG TABS tablet Take 2 tablets (17.2 mg total) by mouth daily. 60 tablet 0   traZODone (DESYREL) 50 MG tablet Take 1 tablet (50 mg total) by mouth at bedtime as needed for sleep. 30 tablet 2   No current facility-administered medications for this visit.     Musculoskeletal: Strength & Muscle Tone: within normal limits Gait & Station: normal Patient leans: N/A  Psychiatric Specialty Exam: Review of Systems  Psychiatric/Behavioral:  Negative for decreased concentration, dysphoric mood, hallucinations, self-injury, sleep disturbance and suicidal ideas. The patient is not nervous/anxious and is not hyperactive.     Blood pressure 119/70, pulse 62, SpO2 100 %.There is no height or weight on file to calculate BMI.  General Appearance: Casual  Eye Contact:  Good  Speech:  Clear and Coherent and Normal Rate  Volume:  Normal  Mood:  Euthymic some anxiety  Affect:  Appropriate and Full Range  Thought Process:  Coherent and Descriptions of Associations: Intact  Orientation:  Full (Time, Place, and Person)  Thought Content: WDL   Suicidal Thoughts:  No  Homicidal Thoughts:  No  Memory:  Immediate;   Good Recent;   Good Remote;   Good  Judgement:  Good  Insight:  Good  Psychomotor Activity:  Normal  Concentration:  Concentration: Good and Attention Span: Good  Recall:  Good  Fund of Knowledge: Good  Language: Good  Akathisia:  No  Handed:  Right  AIMS (if indicated): n/a  Assets:  Communication Skills Desire for Improvement Housing Social Support  ADL's:  Intact  Cognition: WNL  Sleep:  Good   Screenings: AIMS    Flowsheet Row Admission (Discharged) from 09/05/2022 in [facility] Admission (Discharged) from 11/14/2020 in [facility]  AIMS Total Score 0 0      AUDIT    Flowsheet Row Admission (Discharged) from 09/05/2022 in [facility] ED to Hosp-Admission (Discharged) from 08/30/2022 in [facility] [place] [facility] Admission (Discharged) from 11/14/2020 in [facility]  Alcohol Use Disorder Identification Test Final Score (AUDIT) 1 1 0      GAD-7    Flowsheet Row Video Visit from 03/29/2022 in [facility] Video Visit from 12/28/2021 in [facility] Video Visit from 09/27/2021 in [facility] Video Visit [facility]27/20[facility] Visit from 04/25/2021 in [facility]  Total GAD-7 Score '9 14 17 4 4      '$ PHQ2-9    [place] Office Visit from 10/08/2022 in Primary Care at [facility] Video Visit from 06/25/2022 in [facility] Video Visit from 03/29/2022 in [facility] Video Visit from 12/28/2021 in [facility] Video Visit from 09/27/2021 in [facility]  PHQ-2 Total Score 0 4 0 0 0  PHQ-9 Total Score 1 -- -- -- --      Flowsheet Row Admission (Discharged) from 09/05/2022 in [facility] [place] [facility] ED to Hosp-Admission (Discharged) from 08/30/2022 in [facility] [facility] ED from 08/28/2022 in [place] DEPT  C-SSRS RISK CATEGORY No Risk No Risk No Risk        Assessment and Plan: Jeremy R. Guevarra is a 51 year old female with a past psychiatric history significant for generalized anxiety disorder, social anxiety disorder, ADHD and panic disorder who presents to [facility] walk in for follow-up and medication management.  Patient was recently admitted to [facility] from 09/05/2022 -09/11/2022 due to bizarre behavior and her medications were adjusted at that time. Patient was discharged on olanzapine 10 mg nightly, olanzapine 5 mg daily, trazodone 50 mg nightly as needed for sleep, gabapentin 300 mg 3 times daily for  anxiety, Klonopin 0.5 mg twice daily as needed for anxiety. She reports stable mood but still has a lot of social anxiety.  She has not been taking her daily olanzapine which was prescribed while she was in inpatient unit.  Encouraged her to take her medication as prescribed.  We will not make any changes to her medication at this time.    Collaboration of Care: Collaboration of Care: Dr Verdie Drown. Patient/Guardian was advised Release of Information must be obtained prior to any record release in order to collaborate their care with an outside provider. Patient/Guardian was advised if they have not already done so to contact the registration department to sign all necessary forms in order for [place] to release information regarding their care.   Consent: Patient/Guardian gives verbal consent for treatment and assignment of benefits for services provided during this visit. Patient/Guardian expressed understanding and agreed to proceed.   MDD, recurrent in remission -Continue olanzapine 10 mg nightly for mood stabilization -Continue olanzapine 5 mg daily for anxiety and mood stabilization.  2. GAD (generalized anxiety disorder) -Continue Klonopin 0.5 mg 2 times daily as needed for the management of her generalized anxiety disorder. Refill called to pharmacy.  - gabapentin (NEURONTIN) 300 MG tablet; Take 1 tablet (300 mg total) by mouth 3 (three) times daily.  Dispense: 90 tablet; Refill: 2  3. Social anxiety disorder Patient to continue taking Klonopin 0.5 mg 2 times daily as needed for the management of her social anxiety disorder - gabapentin  (NEURONTIN) 300 MG tablet; Take 1 tablet (300 mg total) by mouth 3 (three) times daily.  Dispense: 90 tablet; Refill: 2  4. Panic disorder Patient to continue taking Klonopin 0.5 mg 2 times daily as needed for the management of her panic disorder. Refill called to pharmacy.  Patient to follow up in 4 weeks.   Armando Reichert, MD 10/10/2022, 5:10 PM

## 2022-11-05 ENCOUNTER — Telehealth (INDEPENDENT_AMBULATORY_CARE_PROVIDER_SITE_OTHER): Payer: Medicare HMO | Admitting: Psychiatry

## 2022-11-05 DIAGNOSIS — F411 Generalized anxiety disorder: Secondary | ICD-10-CM | POA: Diagnosis not present

## 2022-11-05 DIAGNOSIS — F410 Panic disorder [episodic paroxysmal anxiety] without agoraphobia: Secondary | ICD-10-CM

## 2022-11-05 DIAGNOSIS — F4010 Social phobia, unspecified: Secondary | ICD-10-CM | POA: Diagnosis not present

## 2022-11-05 DIAGNOSIS — F3341 Major depressive disorder, recurrent, in partial remission: Secondary | ICD-10-CM

## 2022-11-05 MED ORDER — CLONAZEPAM 0.5 MG PO TABS: 0.5000 mg | ORAL_TABLET | Freq: Two times a day (BID) | ORAL | 0 refills | Status: DC

## 2022-11-05 MED ORDER — OLANZAPINE 5 MG PO TABS: 5.0000 mg | ORAL_TABLET | Freq: Every day | ORAL | 1 refills | Status: DC

## 2022-11-05 MED ORDER — TRAZODONE HCL 50 MG PO TABS: 50.0000 mg | ORAL_TABLET | Freq: Every evening | ORAL | 2 refills | Status: DC | PRN

## 2022-11-05 MED ORDER — OLANZAPINE 10 MG PO TABS: 10.0000 mg | ORAL_TABLET | Freq: Every day | ORAL | 1 refills | Status: DC

## 2022-11-05 MED ORDER — HYDROXYZINE HCL 25 MG PO TABS: 25.0000 mg | ORAL_TABLET | Freq: Three times a day (TID) | ORAL | 2 refills | Status: DC | PRN

## 2022-11-05 NOTE — Progress Notes (Addendum)
[facility] MD/PA/NP OP Progress Note  11/05/2022 2:28 PM [name]  MRN:  [id number] Virtual Visit via Video Note  I connected with [name] on 11/05/22 at  2:00 PM EST by a video enabled telemedicine application and verified that I am speaking with the correct person using two identifiers.  Location: Patient: Home Provider: Clinic   I discussed the limitations of evaluation and management by telemedicine and the availability of in person appointments. The patient expressed understanding and agreed to proceed.    I discussed the assessment and treatment plan with the patient. The patient was provided an opportunity to ask questions and all were answered. The patient agreed with the plan and demonstrated an understanding of the instructions.   The patient was advised to call back or seek an in-person evaluation if the symptoms worsen or if the condition fails to improve as anticipated.  I provided 20 minutes of non-face-to-face time during this encounter.   Armando Reichert, MD  Chief Complaint:  Chief Complaint  Patient presents with   Follow-up   Anxiety   Medication Refill    HPI:   [name] is a 51 year old female with a past psychiatric history significant for generalized anxiety disorder, social anxiety disorder, ADHD and panic disorder seen virtually for follow-up and medication management.  Patient was recently admitted to [facility] from 09/05/2022 -09/11/2022 due to bizarre behavior and her medications were adjusted at that time.  Patient reports that she has been feeling very anxious and feels that gabapentin dose was too low to help with her anxiety and pain.  She reports that she found 800 mg of gabapentin at home and has been taking that 3 times daily since last week.  She reports that higher dose of gabapentin has been helping her with her pain and anxiety.  Discussed that she should not change her medications without first talking to her primary care or psychiatrist.   Recommended continuing with recommended dose of gabapentin.  Patient states she will follow-up with her primary care doctor to get higher dose of gabapentin.  Patient states she has not been taking her morning Zyprexa and has been taking only 5 mg at night.  Patient was recommended to take Zyprexa 10 mg at night and 5 mg in the morning.  Discussed that she should take recommended dose of Zyprexa because of her irritability, mood instability and high anxiety.  Patient is irritable, states she does not want to take medication as recommended and also she feels that Xanax was helping her more than Klonopin and wants this provider to change her medication back to Xanax.  Discussed that Klonopin is longer acting as compared to Xanax and we recommend continuing on Klonopin at this time.  Discussed that benzodiazepines are only recommended for shorter period of time and would recommend tapering and stopping Klonopin at future visit.  Patient get irritable and and changed video facing to the ceiling.  She states that she feels that she is not connecting to this provider and wants to follow-up with another provider.  Discussed that she can call the front desk to make an follow-up appointment with a different doctor in 2 months.  Patient is alert , very irritable, anxious, and non-cooperative. Patient denies suicidal or homicidal ideations.  She further denies auditory or visual hallucinations and does not appear to be responding to internal/external stimuli. She is on disability.  Visit Diagnosis:    ICD-10-CM   1. GAD (generalized anxiety disorder)  F41.1 clonazePAM (KLONOPIN) 0.5 MG tablet    2. MDD (major depressive disorder), recurrent, in partial remission (HCC)  F33.41 OLANZapine (ZYPREXA) 5 MG tablet    OLANZapine (ZYPREXA) 10 MG tablet    hydrOXYzine (ATARAX) 25 MG tablet    traZODone (DESYREL) 50 MG tablet    3. Social anxiety disorder  F40.10 clonazePAM (KLONOPIN) 0.5 MG tablet    4. Panic disorder   F41.0 clonazePAM (KLONOPIN) 0.5 MG tablet      Past Psychiatric History:  Attention deficit hyperactivity disorder Generalized anxiety disorder Social anxiety disorder Panic disorder  Past Medical History:  Past Medical History:  Diagnosis Date   ADHD (attention deficit hyperactivity disorder)    Anxiety    Depression    Hx of staphylococcal septicemia     Past Surgical History:  Procedure Laterality Date   ABDOMINAL HYSTERECTOMY     BACK SURGERY     left elbow surgery     VIDEO BRONCHOSCOPY N/A 01/27/2013   Procedure: VIDEO BRONCHOSCOPY WITH FLUORO;  Surgeon: Elsie Stain, MD;  Location: [place];  Service: Cardiopulmonary;  Laterality: N/A;    Family Psychiatric History:  Patient believes that her mother may have had a psychiatric history but she does not know the specifics.  Family History: No family history on file.  Social History:  Social History   Socioeconomic History   Marital status: Widowed    Spouse name: Not on file   Number of children: Not on file   Years of education: Not on file   Highest education level: Not on file  Occupational History   Not on file  Tobacco Use   Smoking status: Former    Packs/day: 1.00    Years: 20.00    Total pack years: 20.00    Types: Cigarettes   Smokeless tobacco: Never  Vaping Use   Vaping Use: Never used  Substance and Sexual Activity   Alcohol use: Not Currently   Drug use: Not Currently    Types: Marijuana   Sexual activity: Not on file  Other Topics Concern   Not on file  Social History Narrative   Not on file   Social Determinants of Health   Financial Resource Strain: Not on file  Food Insecurity: Unknown (09/05/2022)   Hunger Vital Sign    Worried About Running Out of Food in the Last Year: Patient refused    Ran Out of Food in the Last Year: Patient refused  Transportation Needs: Unknown (09/05/2022)   PRAPARE - Hydrologist (Medical): Patient refused    Lack  of Transportation (Non-Medical): Patient refused  Physical Activity: Not on file  Stress: Not on file  Social Connections: Not on file    Allergies:  Allergies  Allergen Reactions   Penicillins Other (See Comments)    Childhood reaction- exact reaction not cited    Metabolic Disorder Labs: Lab Results  Component Value Date   HGBA1C 5.0 09/02/2022   MPG 96.8 09/02/2022   MPG 96.8 08/08/2022   Lab Results  Component Value Date   PROLACTIN 19.2 11/13/2020   Lab Results  Component Value Date   CHOL 180 09/02/2022   TRIG 38 09/02/2022   HDL 68 09/02/2022   CHOLHDL 2.6 09/02/2022   VLDL 8 09/02/2022   LDLCALC 104 (H) 09/02/2022   LDLCALC 133 (H) 08/08/2022   Lab Results  Component Value Date   TSH 1.564 08/31/2022   TSH 1.455 08/08/2022    Therapeutic Level  Labs: No results found for: "LITHIUM" No results found for: "VALPROATE" No results found for: "CBMZ"  Current Medications: Current Outpatient Medications  Medication Sig Dispense Refill   [START ON 11/10/2022] clonazePAM (KLONOPIN) 0.5 MG tablet Take 1 tablet (0.5 mg total) by mouth 2 (two) times daily. 60 tablet 0   gabapentin (NEURONTIN) 300 MG capsule Take 1 capsule (300 mg total) by mouth 3 (three) times daily. 90 capsule 2   hydrOXYzine (ATARAX) 25 MG tablet Take 1 tablet (25 mg total) by mouth 3 (three) times daily as needed for anxiety. 90 tablet 2   OLANZapine (ZYPREXA) 10 MG tablet Take 1 tablet (10 mg total) by mouth at bedtime. 30 tablet 1   OLANZapine (ZYPREXA) 5 MG tablet Take 1 tablet (5 mg total) by mouth daily. 30 tablet 1   traZODone (DESYREL) 50 MG tablet Take 1 tablet (50 mg total) by mouth at bedtime as needed for sleep. 30 tablet 2   No current facility-administered medications for this visit.     Musculoskeletal: Strength & Muscle Tone: within normal limits Gait & Station: normal Patient leans: N/A  Psychiatric Specialty Exam: Review of Systems  Psychiatric/Behavioral:  Negative for  decreased concentration, dysphoric mood, hallucinations, self-injury, sleep disturbance and suicidal ideas. The patient is not nervous/anxious and is not hyperactive.     There were no vitals taken for this visit.There is no height or weight on file to calculate BMI.  General Appearance: Casual  Eye Contact:  Good  Speech:  Clear and Coherent and Normal Rate  Volume:  Normal  Mood:  Anxious and Irritable   Affect:  Constricted  Thought Process:  Coherent and Descriptions of Associations: Intact  Orientation:  Full (Time, Place, and Person)  Thought Content: WDL   Suicidal Thoughts:  No  Homicidal Thoughts:  No  Memory:  Immediate;   Fair Recent;   Fair Remote;   Fair  Judgement:  Poor  Insight:  Shallow  Psychomotor Activity:  Normal  Concentration:  Concentration: Fair and Attention Span: Fair  Recall:  Good  Fund of Knowledge: Good  Language: Good  Akathisia:  No  Handed:  Right  AIMS (if indicated): n/a  Assets:  Communication Skills Desire for Improvement Housing Social Support  ADL's:  Intact  Cognition: WNL  Sleep:  fair   Screenings: AIMS    Flowsheet Row Admission (Discharged) from 09/05/2022 in [place] [facility] Admission (Discharged) from 11/14/2020 in [place] [facility]  AIMS Total Score 0 0      AUDIT    Flowsheet Row Admission (Discharged) from 09/05/2022 in [place] [facility] ED to Hosp-Admission (Discharged) from 08/30/2022 in [place] [facility] Admission (Discharged) from 11/14/2020 in [place] [facility]  Alcohol Use Disorder Identification Test Final Score (AUDIT) 1 1 0      GAD-7    Flowsheet Row Video Visit from 03/29/2022 in [facility] Video Visit from 12/28/2021 in [facility] Video Visit from 09/27/2021 in [facility] Video  Visit from 06/27/2021 in [facility] Video Visit from 04/25/2021 in [facility]  Total GAD-7 Score '9 14 17 4 4      '$ PHQ2-9    [place] Office Visit from 10/08/2022 in [facility] Video Visit from 06/25/2022 in [facility] Video Visit from 03/29/2022 in [place]  [place] Video Visit from 12/28/2021 in [facility] Video Visit from 09/27/2021 in [facility]  PHQ-2 Total Score 0 4 0 0 0  PHQ-9 Total Score 1 -- -- -- --      Flowsheet Row Admission (Discharged) from 09/05/2022 in [place] [facility] ED to Hosp-Admission (Discharged) from 08/30/2022 in [place] [facility] ED from 08/28/2022 in [place] DEPT  C-SSRS RISK CATEGORY No Risk No Risk No Risk        Assessment and Plan: [name] is a 51 year old female with a past psychiatric history significant for generalized anxiety disorder, social anxiety disorder, ADHD and panic disorder seen virtually for follow-up and medication management.  Patient was recently admitted to [facility] from 09/05/2022 -09/11/2022 due to bizarre behavior and her medications were adjusted at that time. Patient was recently admitted to [facility] from 09/05/2022 -09/11/2022 due to bizarre behavior and her medications were adjusted at that time. Patient was discharged on olanzapine 10 mg nightly, olanzapine 5 mg daily, trazodone 50 mg nightly as needed for sleep, gabapentin 300 mg 3 times daily for anxiety, Klonopin 0.5 mg twice daily as needed for anxiety. Patient has not been using her medications as prescribed and changed medication dosages by herself.  She has been taking more than recommended dose of gabapentin and will talk to her PCP for increasing dose of gabapentin. She has also not been taking Zyprexa twice  daily as recommended and only using 5 mg at night.  Patient is irritable and wants to change Klonopin to Xanax again.  Recommended to continue medications as prescribed for mood stability, anxiety and irritability.  Encouraged medication compliance.  Will not change medications or medication dosages at this time but recommend tapering and stopping Klonopin in future visits.  Patient would like to follow-up with another doctor due to her wanting to change her medication dosages according to her.  Collaboration of Care: Collaboration of Care: Dr Verdie Drown. Patient/Guardian was advised Release of Information must be obtained prior to any record release in order to collaborate their care with an outside provider. Patient/Guardian was advised if they have not already done so to contact the registration department to sign all necessary forms in order for [place] to release information regarding their care.   Consent: Patient/Guardian gives verbal consent for treatment and assignment of benefits for services provided during this visit. Patient/Guardian expressed understanding and agreed to proceed.   MDD, recurrent in partial remission -Continue olanzapine 10 mg nightly for mood stabilization -Continue olanzapine 5 mg daily for anxiety and mood stabilization.  2. GAD (generalized anxiety disorder)  Social anxiety disorder Panic disorder -Continue Klonopin 0.5 mg 2 times daily as needed for the management of her generalized anxiety disorder.  Will recommend tapering and stopping Klonopin in future visits due to risk of dependence, abuse, resistance especially with her self medicating behaviors. - gabapentin (NEURONTIN) 300 MG tablet; Take 1 tablet (300 mg total) by mouth 3 (three) times daily. (Patient has been taking 800 mg of Neurontin 3 times daily and would talk to her PCP to increase dose of Neurontin)  Patient to follow up in 8 weeks Patient requesting follow-up with different psychiatrist.  Armando Reichert, MD 11/05/2022, 2:28 PM

## 2022-11-15 ENCOUNTER — Encounter (HOSPITAL_COMMUNITY): Payer: Self-pay | Admitting: Psychiatry

## 2022-12-14 ENCOUNTER — Telehealth: Payer: Self-pay | Admitting: Family Medicine

## 2022-12-14 NOTE — Telephone Encounter (Signed)
Tried calling patient to schedule Medicare Annual Wellness Visit (AWV) either virtually or phone.    my jabber number [phone]  No answer could not leave message  awvi 09/16/10 per [facility]  please schedule with Nurse Health Adviser   45 min for awv-i and in office appointments 30 min for awv-s  phone/virtual appointments

## 2022-12-25 ENCOUNTER — Telehealth (HOSPITAL_COMMUNITY): Payer: Medicare HMO | Admitting: Physician Assistant

## 2022-12-26 ENCOUNTER — Telehealth (INDEPENDENT_AMBULATORY_CARE_PROVIDER_SITE_OTHER): Payer: Medicare HMO | Admitting: Physician Assistant

## 2022-12-26 ENCOUNTER — Encounter (HOSPITAL_COMMUNITY): Payer: Self-pay | Admitting: Physician Assistant

## 2022-12-26 DIAGNOSIS — F3341 Major depressive disorder, recurrent, in partial remission: Secondary | ICD-10-CM | POA: Diagnosis not present

## 2022-12-26 DIAGNOSIS — F4010 Social phobia, unspecified: Secondary | ICD-10-CM | POA: Diagnosis not present

## 2022-12-26 DIAGNOSIS — F410 Panic disorder [episodic paroxysmal anxiety] without agoraphobia: Secondary | ICD-10-CM | POA: Diagnosis not present

## 2022-12-26 DIAGNOSIS — F411 Generalized anxiety disorder: Secondary | ICD-10-CM

## 2022-12-26 MED ORDER — TRAZODONE HCL 50 MG PO TABS: 50.0000 mg | ORAL_TABLET | Freq: Every evening | ORAL | 1 refills | Status: DC | PRN

## 2022-12-26 MED ORDER — OLANZAPINE 10 MG PO TABS: 10.0000 mg | ORAL_TABLET | Freq: Every day | ORAL | 1 refills | Status: DC

## 2022-12-26 MED ORDER — GABAPENTIN 300 MG PO CAPS: 300.0000 mg | ORAL_CAPSULE | Freq: Three times a day (TID) | ORAL | 1 refills | Status: DC

## 2022-12-26 MED ORDER — OLANZAPINE 5 MG PO TABS: 5.0000 mg | ORAL_TABLET | Freq: Every day | ORAL | 1 refills | Status: DC

## 2022-12-26 MED ORDER — CLONAZEPAM 0.5 MG PO TABS: 0.5000 mg | ORAL_TABLET | Freq: Two times a day (BID) | ORAL | 0 refills | Status: DC

## 2022-12-26 NOTE — Progress Notes (Signed)
[place] MD/PA/NP OP Progress Note  Virtual Visit via Telephone Note  I connected with [name] on 12/26/22 at  4:00 PM EST by telephone and verified that I am speaking with the correct person using two identifiers.  Location: Patient: Home Provider: Clinic   I discussed the limitations, risks, security and privacy concerns of performing an evaluation and management service by telephone and the availability of in person appointments. I also discussed with the patient that there may be a patient responsible charge related to this service. The patient expressed understanding and agreed to proceed.  Follow Up Instructions:  I discussed the assessment and treatment plan with the patient. The patient was provided an opportunity to ask questions and all were answered. The patient agreed with the plan and demonstrated an understanding of the instructions.   The patient was advised to call back or seek an in-person evaluation if the symptoms worsen or if the condition fails to improve as anticipated.  I provided 18 minutes of non-face-to-face time during this encounter.  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  Patient was last seen by Armando Reichert, MD on 11/05/2022.  During her last encounter, patient was being managed on the following psychiatric medications:  Olanzapine 10 mg at bedtime Olanzapine 5 mg daily Klonopin 0.5 mg 2 times daily as needed Neurontin 300 mg 3 times daily Trazodone 50 mg  at bedtime  Patient reports that there was some confusion with her medication regimen.  She reports that she was unaware that she was supposed to be taking 2 different dosages of olanzapine and has only been taking olanzapine 10 mg at bedtime.  Patient states that she was also being given 2 different dosages of gabapentin at 300 mg 3 times daily and one at 800 mg daily.  Lastly, patient states that she was receiving hydroxyzine even though she did not need the medication.  Patient was informed that she is supposed to be taking olanzapine 10 mg at bedtime as well as 5 mg daily for the management of her major depressive disorder and anxiety.  Patient was reassured that she would only be receiving gabapentin 300 mg 3 times daily for the management of her anxiety.  Lastly, provider informed patient that she would no longer be receiving hydroxyzine if she no longer needs the medication.  In addition to confusion with her medication, patient states that she has been without her medications for some time.  Since being without her medication, patient states that she has been having episodes of severe anxiety.  She notes that Xanax was more effective in managing her anxiety than Klonopin has been.  She notes that even though she is supposed to take Klonopin 2 times daily as needed, she states that 2 doses of Klonopin is what has been helpful in managing her anxiety at this time.  Patient notes that she has also been gaining weight and has gained between 30 and 40 pounds.  Patient was informed that her weight gain is most likely due to her  use of olanzapine.  Patient denies any depression at this time but continues to endorse anxiety.  In addition to her anxiety, patient states that she has not been experiencing panic attacks characterized by the following symptoms: chest tightness, the feeling of fight or flight, and the sensation that the walls are closing in on her.  Although she has been dealing with unwanted panic  attacks, she states that she is mostly in good spirits about everything else in her life.  Patient states that she is still trying to adjust to living on her own in an apartment since being discharged from the mental health hospital.  A GAD-7 screen was performed with the patient's going to 10.  Patient is alert and oriented x 4, calm, cooperative, and fully engaged in conversation during the encounter.  Patient states that she feels good but is concerned about her ongoing panic attacks.  Patient denies suicidal or homicidal ideations.  She further denies auditory or visual hallucinations and does not appear to be responding to internal/external stimuli.  Patient endorses intermittent sleep stating that she receives on average 4 to 6 hours of sleep often characterized by tossing and turning.  Patient endorses good appetite and eats on average 3 meals per day.  Patient denies alcohol consumption, tobacco use, and illicit drug use.  Visit Diagnosis:    ICD-10-CM   1. MDD (major depressive disorder), recurrent, in partial remission (HCC)  F33.41 traZODone (DESYREL) 50 MG tablet    OLANZapine (ZYPREXA) 5 MG tablet    OLANZapine (ZYPREXA) 10 MG tablet    2. GAD (generalized anxiety disorder)  F41.1 gabapentin (NEURONTIN) 300 MG capsule    clonazePAM (KLONOPIN) 0.5 MG tablet    3. Social anxiety disorder  F40.10 clonazePAM (KLONOPIN) 0.5 MG tablet    4. Panic disorder  F41.0 clonazePAM (KLONOPIN) 0.5 MG tablet      Past Psychiatric History:  Major depressive disorder (recurrent, in partial remission) Generalized anxiety disorder Social anxiety disorder Panic disorder  Past Medical History:  Past Medical History:  Diagnosis Date   ADHD (attention deficit hyperactivity disorder)    Anxiety    Depression    Hx of staphylococcal septicemia     Past Surgical History:  Procedure Laterality Date   ABDOMINAL HYSTERECTOMY     BACK SURGERY     left elbow surgery     VIDEO BRONCHOSCOPY N/A  01/27/2013   Procedure: VIDEO BRONCHOSCOPY WITH FLUORO;  Surgeon: Elsie Stain, MD;  Location: [place];  Service: Cardiopulmonary;  Laterality: N/A;    Family Psychiatric History:  Patient believes that her mother may have had a psychiatric history but she does not know the specifics.  Family History: History reviewed. No pertinent family history.  Social History:  Social History   Socioeconomic History   Marital status: Widowed    Spouse name: Not on file   Number of children: Not on file   Years of education: Not on file   Highest education level: Not on file  Occupational History   Not on file  Tobacco Use   Smoking status: Former    Packs/day: 1.00    Years: 20.00    Total pack years: 20.00    Types: Cigarettes   Smokeless tobacco: Never  Vaping Use   Vaping Use: Never used  Substance and Sexual Activity   Alcohol use: Not Currently   Drug use: Not Currently    Types: Marijuana   Sexual activity: Not on file  Other Topics Concern  Not on file  Social History Narrative   Not on file   Social Determinants of Health   Financial Resource Strain: Not on file  Food Insecurity: Unknown (09/05/2022)   Hunger Vital Sign    Worried About Running Out of Food in the Last Year: Patient refused    [place] in the Last Year: Patient refused  Transportation Needs: Unknown (09/05/2022)   PRAPARE - Hydrologist (Medical): Patient refused    Lack of Transportation (Non-Medical): Patient refused  Physical Activity: Not on file  Stress: Not on file  Social Connections: Not on file    Allergies:  Allergies  Allergen Reactions   Penicillins Other (See Comments)    Childhood reaction- exact reaction not cited    Metabolic Disorder Labs: Lab Results  Component Value Date   HGBA1C 5.0 09/02/2022   MPG 96.8 09/02/2022   MPG 96.8 08/08/2022   Lab Results  Component Value Date   PROLACTIN 19.2 11/13/2020   Lab Results   Component Value Date   CHOL 180 09/02/2022   TRIG 38 09/02/2022   HDL 68 09/02/2022   CHOLHDL 2.6 09/02/2022   VLDL 8 09/02/2022   LDLCALC 104 (H) 09/02/2022   LDLCALC 133 (H) 08/08/2022   Lab Results  Component Value Date   TSH 1.564 08/31/2022   TSH 1.455 08/08/2022    Therapeutic Level Labs: No results found for: "LITHIUM" No results found for: "VALPROATE" No results found for: "CBMZ"  Current Medications: Current Outpatient Medications  Medication Sig Dispense Refill   clonazePAM (KLONOPIN) 0.5 MG tablet Take 1 tablet (0.5 mg total) by mouth 2 (two) times daily. 60 tablet 0   gabapentin (NEURONTIN) 300 MG capsule Take 1 capsule (300 mg total) by mouth 3 (three) times daily. 90 capsule 1   hydrOXYzine (ATARAX) 25 MG tablet Take 1 tablet (25 mg total) by mouth 3 (three) times daily as needed for anxiety. 90 tablet 2   OLANZapine (ZYPREXA) 10 MG tablet Take 1 tablet (10 mg total) by mouth at bedtime. 30 tablet 1   OLANZapine (ZYPREXA) 5 MG tablet Take 1 tablet (5 mg total) by mouth daily. 30 tablet 1   traZODone (DESYREL) 50 MG tablet Take 1 tablet (50 mg total) by mouth at bedtime as needed for sleep. 30 tablet 1   No current facility-administered medications for this visit.     Musculoskeletal: Strength & Muscle Tone: Unable to assess due to telemedicine visit [place]: Unable to assess due to telemedicine visit Patient leans: Unable to assess due to telemedicine visit  Psychiatric Specialty Exam: Review of Systems  Psychiatric/Behavioral:  Positive for sleep disturbance. Negative for decreased concentration, dysphoric [name], hallucinations, self-injury and suicidal ideas. The patient is nervous/anxious. The patient is not hyperactive.     There were no vitals taken for this visit.There is no height or weight on file to calculate BMI.  General Appearance: Unable to assess due to telemedicine visit  Eye Contact:  Unable to assess due to telemedicine visit   Speech:  Clear and Coherent and Normal Rate  Volume:  Normal  [name]:  Anxious  Affect:  Congruent  Thought Process:  Coherent and Descriptions of Associations: Intact  Orientation:  Full (Time, Place, and Person)  Thought Content: WDL   Suicidal Thoughts:  No  Homicidal Thoughts:  No  Memory:  Immediate;   Fair Recent;   Fair Remote;   Fair  Judgement:  Fair  Insight:  Fair  Psychomotor Activity:  Normal  Concentration:  Concentration: Good and Attention Span: Good  Recall:  Good  Fund of Knowledge: Good  Language: Good  Akathisia:  No  Handed:  Right  AIMS (if indicated): N/A  Assets:  Communication Skills Desire for Improvement Housing Social Support  ADL's:  Intact  Cognition: WNL  Sleep:  Fair   Screenings: AIMS    Flowsheet Row Admission (Discharged) from 09/05/2022 in [facility] Admission (Discharged) from 11/14/2020 in [facility]  AIMS Total Score 0 0      AUDIT    Flowsheet Row Admission (Discharged) from 09/05/2022 in [facility] ED to Hosp-Admission (Discharged) from 08/30/2022 in [facility] Admission (Discharged) from 11/14/2020 in [facility]  Alcohol Use Disorder Identification Test Final Score (AUDIT) 1 1 0      GAD-7    Flowsheet Row Video Visit from 12/26/2022 in [facility] Video Visit from 03/29/2022 in [facility] Video Visit from 12/28/2021 in [facility] Video Visit from 09/27/2021 in [facility] Video Visit from 06/27/2021 in [facility]  Total GAD-7 Score '10 9 14 17 4      '$ PHQ2-9    Flowsheet Row Video Visit from 12/26/2022 in [facility] Office Visit from 10/08/2022 in [facility] Video  Visit from 06/25/2022 in [facility] Video Visit from 03/29/2022 in [facility] Video Visit from 12/28/2021 in [facility]  PHQ-2 Total Score 0 0 4 0 0  PHQ-9 Total Score -- 1 -- -- --      Flowsheet Row Video Visit from 12/26/2022 in [facility] Admission (Discharged) from 09/05/2022 in [facility] ED to Hosp-Admission (Discharged) from 08/30/2022 in [facility]  C-SSRS RISK CATEGORY No Risk No Risk No Risk        Assessment and Plan:   Derra R. 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  Patient reports that there is some confusion and her medication regimen.  Patient was properly instructed on how to take her new medication regimen.  Patient vocalized understanding.  Patient denies depression but continues to endorse worsening anxiety and ongoing panic attacks.  She reports that her Klonopin is not as effective as Xanax in managing her anxiety.  Patient states that she occasionally has to use 2 doses of the Klonopin to help lower her anxiety.  Patient also endorses experiencing panic attacks.  Despite issues with her anxiety, patient reports no other major issues or concerns regarding her mental health.  Patient will continue to take her medications as prescribed.  Patient's medications to be e-prescribed to pharmacy of choice.  Patient voiced some concerns regarding her weight gain from the use of olanzapine.  Provider informed patient of Lybalvi, an olanzapine derivative medication, that can be used to manage her  symptoms without severe weight gain.  Patient showed interest in the medication.  Provider to look into the use of Lybalvi refer patient's current psychiatric needs.  Collaboration of Care: Collaboration of Care: Medication Management AEB provider managing patient's psychiatric medications, Primary Care Provider AEB patient being seen by a primary care provider, and Psychiatrist AEB patient being followed by a mental health provider  Patient/Guardian was advised Release of Information must be obtained prior to any record release in order to collaborate their care with an outside provider. Patient/Guardian was advised if they have not already done so to contact the registration department to sign all necessary forms in order for [place] to release information regarding their care.   Consent: Patient/Guardian gives verbal consent for treatment and assignment of benefits for services provided during this visit. Patient/Guardian expressed understanding and agreed to proceed.   1. MDD (major depressive disorder), recurrent, in partial remission (HCC)  - traZODone (DESYREL) 50 MG tablet; Take 1 tablet (50 mg total) by mouth at bedtime as needed for sleep.  Dispense: 30 tablet; Refill: 1 - OLANZapine (ZYPREXA) 5 MG tablet; Take 1 tablet (5 mg total) by mouth daily.  Dispense: 30 tablet; Refill: 1 - OLANZapine (ZYPREXA) 10 MG tablet; Take 1 tablet (10 mg total) by mouth at bedtime.  Dispense: 30 tablet; Refill: 1  2. GAD (generalized anxiety disorder)  - gabapentin (NEURONTIN) 300 MG capsule; Take 1 capsule (300 mg total) by mouth 3 (three) times daily.  Dispense: 90 capsule; Refill: 1 - clonazePAM (KLONOPIN) 0.5 MG tablet; Take 1 tablet (0.5 mg total) by mouth 2 (two) times daily.  Dispense: 60 tablet; Refill: 0  3. Social anxiety disorder  - clonazePAM (KLONOPIN) 0.5 MG tablet; Take 1 tablet (0.5 mg total) by mouth 2 (two) times daily.  Dispense: 60 tablet; Refill: 0  4. Panic disorder  - clonazePAM  (KLONOPIN) 0.5 MG tablet; Take 1 tablet (0.5 mg total) by mouth 2 (two) times daily.  Dispense: 60 tablet; Refill: 0  Patient to follow-up in 6 weeks Provider spent a total of 18 minutes with the patient/reviewing patient's chart  Brenda Mood, PA 12/26/2022, 7:34 PM

## 2023-01-26 ENCOUNTER — Other Ambulatory Visit (HOSPITAL_COMMUNITY): Payer: Self-pay | Admitting: Physician Assistant

## 2023-01-26 DIAGNOSIS — F411 Generalized anxiety disorder: Secondary | ICD-10-CM

## 2023-01-26 DIAGNOSIS — F4010 Social phobia, unspecified: Secondary | ICD-10-CM

## 2023-01-26 DIAGNOSIS — F410 Panic disorder [episodic paroxysmal anxiety] without agoraphobia: Secondary | ICD-10-CM

## 2023-01-30 ENCOUNTER — Other Ambulatory Visit (HOSPITAL_COMMUNITY): Payer: Self-pay | Admitting: Physician Assistant

## 2023-02-01 ENCOUNTER — Other Ambulatory Visit: Payer: Self-pay | Admitting: *Deleted

## 2023-02-05 ENCOUNTER — Other Ambulatory Visit (HOSPITAL_COMMUNITY): Payer: Self-pay | Admitting: Physician Assistant

## 2023-02-05 ENCOUNTER — Ambulatory Visit: Payer: Medicare HMO | Admitting: Family Medicine

## 2023-02-05 DIAGNOSIS — F3341 Major depressive disorder, recurrent, in partial remission: Secondary | ICD-10-CM

## 2023-02-06 ENCOUNTER — Encounter (HOSPITAL_COMMUNITY): Payer: Self-pay | Admitting: Physician Assistant

## 2023-02-06 ENCOUNTER — Telehealth (INDEPENDENT_AMBULATORY_CARE_PROVIDER_SITE_OTHER): Payer: Medicare HMO | Admitting: Physician Assistant

## 2023-02-06 DIAGNOSIS — F411 Generalized anxiety disorder: Secondary | ICD-10-CM

## 2023-02-06 DIAGNOSIS — F410 Panic disorder [episodic paroxysmal anxiety] without agoraphobia: Secondary | ICD-10-CM

## 2023-02-06 DIAGNOSIS — F3341 Major depressive disorder, recurrent, in partial remission: Secondary | ICD-10-CM | POA: Diagnosis not present

## 2023-02-06 DIAGNOSIS — F4010 Social phobia, unspecified: Secondary | ICD-10-CM

## 2023-02-06 NOTE — Progress Notes (Signed)
BH MD/PA/NP OP Progress Note  Virtual Visit via Video Note  I connected with [name] on 02/06/23 at  4:00 PM EST by a video enabled telemedicine application and verified that I am speaking with the correct person using two identifiers.  Location: Patient: Home Provider: Clinic   I discussed the limitations of evaluation and management by telemedicine and the availability of in person appointments. The patient expressed understanding and agreed to proceed.  Follow Up Instructions:   I discussed the assessment and treatment plan with the patient. The patient was provided an opportunity to ask questions and all were answered. The patient agreed with the plan and demonstrated an understanding of the instructions.   The patient was advised to call back or seek an in-person evaluation if the symptoms worsen or if the condition fails to improve as anticipated.  I provided 15 minutes of non-face-to-face time during this encounter.  Brenda Mood, PA    02/06/2023 7:17 PM [name]  MRN:  [id number]  Chief Complaint:  Chief Complaint  Patient presents with   Follow-up   HPI: ***  [name]  Visit Diagnosis:    ICD-10-CM   1. Social anxiety disorder  F40.10     2. Panic disorder  F41.0     3. GAD (generalized anxiety disorder)  F41.1     4. MDD (major depressive disorder), recurrent, in partial remission ([place])  F33.41       Past Psychiatric History:  Major depressive disorder (recurrent, in partial remission) Generalized anxiety disorder Social anxiety disorder Panic disorder  Past Medical History:  Past Medical History:  Diagnosis Date   ADHD (attention deficit hyperactivity disorder)    Anxiety    Depression    Hx of staphylococcal septicemia     Past Surgical History:  Procedure Laterality Date   ABDOMINAL HYSTERECTOMY     BACK SURGERY     left elbow surgery     VIDEO BRONCHOSCOPY N/A 01/27/2013   Procedure: VIDEO BRONCHOSCOPY WITH FLUORO;   Surgeon: Elsie Stain, MD;  Location: [facility];  Service: Cardiopulmonary;  Laterality: N/A;    Family Psychiatric History:  Patient believes that her mother may have had a psychiatric history but she does not know the specifics.   Family History: History reviewed. No pertinent family history.  Social History:  Social History   Socioeconomic History   Marital status: Widowed    Spouse name: Not on file   Number of children: Not on file   Years of education: Not on file   Highest education level: Not on file  Occupational History   Not on file  Tobacco Use   Smoking status: Former    Packs/day: 1.00    Years: 20.00    Total pack years: 20.00    Types: Cigarettes   Smokeless tobacco: Never  Vaping Use   Vaping Use: Never used  Substance and Sexual Activity   Alcohol use: Not Currently   Drug use: Not Currently    Types: Marijuana   Sexual activity: Not on file  Other Topics Concern   Not on file  Social History Narrative   Not on file   Social Determinants of Health   Financial Resource Strain: Not on file  Food Insecurity: Unknown (09/05/2022)   Hunger Vital Sign    Worried About Running Out of Food in the Last Year: Patient refused    [place] in the Last Year: Patient refused  Transportation  Needs: Unknown (09/05/2022)   PRAPARE - Hydrologist (Medical): Patient refused    Lack of Transportation (Non-Medical): Patient refused  Physical Activity: Not on file  Stress: Not on file  Social Connections: Not on file    Allergies:  Allergies  Allergen Reactions   Penicillins Other (See Comments)    Childhood reaction- exact reaction not cited    Metabolic Disorder Labs: Lab Results  Component Value Date   HGBA1C 5.0 09/02/2022   MPG 96.8 09/02/2022   MPG 96.8 08/08/2022   Lab Results  Component Value Date   PROLACTIN 19.2 11/13/2020   Lab Results  Component Value Date   CHOL 180 09/02/2022   TRIG 38  09/02/2022   HDL 68 09/02/2022   CHOLHDL 2.6 09/02/2022   VLDL 8 09/02/2022   LDLCALC 104 (H) 09/02/2022   LDLCALC 133 (H) 08/08/2022   Lab Results  Component Value Date   TSH 1.564 08/31/2022   TSH 1.455 08/08/2022    Therapeutic Level Labs: No results found for: "LITHIUM" No results found for: "VALPROATE" No results found for: "CBMZ"  Current Medications: Current Outpatient Medications  Medication Sig Dispense Refill   clonazePAM (KLONOPIN) 0.5 MG tablet TAKE 1 TABLET BY MOUTH 2 TIMES DAILY. 60 tablet 0   gabapentin (NEURONTIN) 300 MG capsule Take 1 capsule (300 mg total) by mouth 3 (three) times daily. 90 capsule 1   hydrOXYzine (ATARAX) 25 MG tablet Take 1 tablet (25 mg total) by mouth 3 (three) times daily as needed for anxiety. 90 tablet 2   OLANZapine (ZYPREXA) 10 MG tablet TAKE 1 TABLET BY MOUTH EVERYDAY AT BEDTIME 90 tablet 1   OLANZapine (ZYPREXA) 5 MG tablet Take 1 tablet (5 mg total) by mouth daily. 30 tablet 1   traZODone (DESYREL) 50 MG tablet Take 1 tablet (50 mg total) by mouth at bedtime as needed for sleep. 30 tablet 1   No current facility-administered medications for this visit.     Musculoskeletal: Strength & Muscle Tone: within normal limits Gait & Station: normal Patient leans: N/A  Psychiatric Specialty Exam: Review of Systems  Psychiatric/Behavioral:  Negative for decreased concentration, dysphoric [name], hallucinations, self-injury, sleep disturbance and suicidal ideas. The patient is not nervous/anxious and is not hyperactive.     There were no vitals taken for this visit.There is no height or weight on file to calculate BMI.  General Appearance: Casual  Eye Contact:  Good  Speech:  Clear and Coherent and Normal Rate  Volume:  Normal  [name]:  Euthymic  Affect:  Appropriate  Thought Process:  Coherent and Descriptions of Associations: Intact  Orientation:  Full (Time, Place, and Person)  Thought Content: WDL   Suicidal Thoughts:  No   Homicidal Thoughts:  No  Memory:  Immediate;   Fair Recent;   Fair Remote;   Fair  Judgement:  Fair  Insight:  Fair  Psychomotor Activity:  Normal  Concentration:  Concentration: Good and Attention Span: Good  Recall:  Good  Fund of Knowledge: Good  Language: Good  Akathisia:  No  Handed:  Right  AIMS (if indicated): not done  Assets:  Communication Skills Desire for Improvement Housing Social Support  ADL's:  Intact  Cognition: WNL  Sleep:  Good   Screenings: AIMS    Flowsheet Row Admission (Discharged) from 09/05/2022 in [facility] Admission (Discharged) from 11/14/2020 in [facility]  AIMS Total Score 0 0  AUDIT    Flowsheet Row Admission (Discharged) from 09/05/2022 in [facility] ED to Hosp-Admission (Discharged) from 08/30/2022 in [facility] Admission (Discharged) from 11/14/2020 in [facility]  Alcohol Use Disorder Identification Test Final Score (AUDIT) 1 1 0      GAD-7    Flowsheet Row Video Visit from 02/06/2023 in [facility] Video Visit from 12/26/2022 in [facility] Video Visit from 03/29/2022 in [facility] Video Visit from 12/28/2021 in [facility] Video Visit from 09/27/2021 in [facility]  Total GAD-7 Score '8 10 9 14 17      '$ PHQ2-9    Flowsheet Row Video Visit from 02/06/2023 in [facility] Video Visit from 12/26/2022 in [facility] Office Visit from 10/08/2022 in [facility] Video Visit from 06/25/2022 in [facility] Video Visit from 03/29/2022 in [facility]  PHQ-2 Total Score 0 0 0 4 0  PHQ-9 Total  Score -- -- 1 -- --      Flowsheet Row Video Visit from 02/06/2023 in [facility] Video Visit from 12/26/2022 in [facility] Admission (Discharged) from 09/05/2022 in [facility]  C-SSRS RISK CATEGORY No Risk No Risk No Risk        Assessment and Plan: ***    Collaboration of Care: Collaboration of Care: Medication Management AEB provider managing patient's psychiatric medications, Primary Care Provider AEB patient being followed by a primary care provider, and Psychiatrist AEB patient being followed by mental health provider at this facility  Patient/Guardian was advised Release of Information must be obtained prior to any record release in order to collaborate their care with an outside provider. Patient/Guardian was advised if they have not already done so to contact the registration department to sign all necessary forms in order for [place] to release information regarding their care.   Consent: Patient/Guardian gives verbal consent for treatment and assignment of benefits for services provided during this visit. Patient/Guardian expressed understanding and agreed to proceed.   1. Social anxiety disorder Patient to continue to take clonazepam 0.5 mg 2 times daily for the management of her social anxiety disorder  2. Panic disorder Patient to continue taking clonazepam 0.5 mg 3 times daily for the management of her social anxiety disorder  3. GAD (generalized anxiety disorder) Patient to continue taking gabapentin 300 mg 3 times daily for the management of her generalized anxiety disorder Patient to continue taking clonazepam 0.5 mg 3 times daily for the management of her generalized anxiety disorder  4. MDD (major depressive disorder), recurrent, in partial remission ([place]) Patient to continue taking trazodone 50 mg at bedtime for the management of her sleep and major depressive disorder  Patient  to follow-up within 6 weeks Provider spent a total of 15 minutes with the patient/reviewing patient's chart  Brenda Mood, PA 02/06/2023, 7:17 PM

## 2023-02-08 ENCOUNTER — Other Ambulatory Visit (HOSPITAL_COMMUNITY): Payer: Self-pay | Admitting: Physician Assistant

## 2023-02-08 DIAGNOSIS — F3341 Major depressive disorder, recurrent, in partial remission: Secondary | ICD-10-CM

## 2023-02-08 MED ORDER — ARIPIPRAZOLE 5 MG PO TABS: 5.0000 mg | ORAL_TABLET | Freq: Every day | ORAL | 1 refills | Status: DC

## 2023-02-08 MED ORDER — OLANZAPINE 2.5 MG PO TABS: ORAL_TABLET | ORAL | 0 refills | Status: DC

## 2023-02-08 NOTE — Progress Notes (Signed)
Writer reached out to patient in regards to concerns over weight gain due to use of olanzapine.  Provider informed patient that she could not be placed on Lybalvi since medication is not indicated for depression.  Provider recommended placing patient on Abilify for the management of her depressive symptoms.  Patient was agreeable to recommendation.  Before starting Abilify, provider informed patient that she would need to taper off her olanzapine prescription.  Patient was recommended discontinuing her morning dose of olanzapine and taking olanzapine 10 mg at bedtime for a week, followed by olanzapine 5 mg at bedtime for a week, then olanzapine 2.5 mg at bedtime for a week before discontinuing olanzapine.  Patient to start Abilify 5 mg daily once she is close to completing tapering schedule for olanzapine.  Patient vocalized understanding.  Patient's medications to be e-prescribed to pharmacy of choice.

## 2023-02-26 ENCOUNTER — Other Ambulatory Visit (HOSPITAL_COMMUNITY): Payer: Self-pay | Admitting: Physician Assistant

## 2023-02-26 DIAGNOSIS — F4010 Social phobia, unspecified: Secondary | ICD-10-CM

## 2023-02-26 DIAGNOSIS — F410 Panic disorder [episodic paroxysmal anxiety] without agoraphobia: Secondary | ICD-10-CM

## 2023-02-26 DIAGNOSIS — F411 Generalized anxiety disorder: Secondary | ICD-10-CM

## 2023-03-03 ENCOUNTER — Other Ambulatory Visit (HOSPITAL_COMMUNITY): Payer: Self-pay | Admitting: Physician Assistant

## 2023-03-03 DIAGNOSIS — F3341 Major depressive disorder, recurrent, in partial remission: Secondary | ICD-10-CM

## 2023-03-21 ENCOUNTER — Telehealth (INDEPENDENT_AMBULATORY_CARE_PROVIDER_SITE_OTHER): Payer: Medicare HMO | Admitting: Physician Assistant

## 2023-03-21 DIAGNOSIS — F411 Generalized anxiety disorder: Secondary | ICD-10-CM

## 2023-03-21 DIAGNOSIS — F4010 Social phobia, unspecified: Secondary | ICD-10-CM | POA: Diagnosis not present

## 2023-03-21 DIAGNOSIS — F410 Panic disorder [episodic paroxysmal anxiety] without agoraphobia: Secondary | ICD-10-CM | POA: Diagnosis not present

## 2023-03-21 DIAGNOSIS — F3341 Major depressive disorder, recurrent, in partial remission: Secondary | ICD-10-CM | POA: Diagnosis not present

## 2023-03-23 ENCOUNTER — Encounter (HOSPITAL_COMMUNITY): Payer: Self-pay | Admitting: Physician Assistant

## 2023-03-23 MED ORDER — TRAZODONE HCL 50 MG PO TABS: 50.0000 mg | ORAL_TABLET | Freq: Every evening | ORAL | 1 refills | Status: DC | PRN

## 2023-03-23 MED ORDER — GABAPENTIN 300 MG PO CAPS
300.0000 mg | ORAL_CAPSULE | Freq: Three times a day (TID) | ORAL | 1 refills | Status: DC
Start: 2023-03-23 — End: 2023-05-03

## 2023-03-23 MED ORDER — CLONAZEPAM 0.5 MG PO TABS: 0.5000 mg | ORAL_TABLET | Freq: Two times a day (BID) | ORAL | 0 refills | Status: DC

## 2023-03-23 NOTE — Progress Notes (Signed)
BH MD/PA/NP OP Progress Note  Virtual Visit via Video Note  I connected with [name] on 03/23/23 at  4:00 PM EDT by a video enabled telemedicine application and verified that I am speaking with the correct person using two identifiers.  Location: Patient: Home Provider: Clinic   I discussed the limitations of evaluation and management by telemedicine and the availability of in person appointments. The patient expressed understanding and agreed to proceed.  Follow Up Instructions:   I discussed the assessment and treatment plan with the patient. The patient was provided an opportunity to ask questions and all were answered. The patient agreed with the plan and demonstrated an understanding of the instructions.   The patient was advised to call back or seek an in-person evaluation if the symptoms worsen or if the condition fails to improve as anticipated.  I provided 9 minutes of non-face-to-face time during this encounter.  Meta Hatchet, PA    03/23/2023 2:21 PM [name]  MRN:  [id number]  Chief Complaint:  Chief Complaint  Patient presents with   Follow-up   Medication Refill   HPI:   [name] is a 52 year old, Caucasian female with a past psychiatric history significant for major depressive disorder (in partial remission), generalized anxiety disorder, social anxiety disorder, and panic disorder who presents to [facility] via virtual video visit for follow-up and medication management.  Patient is currently being managed on the following psychiatric medications:  Abilify 5 mg daily Klonopin 0.5 mg 2 times daily as needed Neurontin 300 mg 3 times daily Trazodone 50 mg at bedtime  Patient reports that she has not taken her Abilify stating that she just recently quit that the medication.  She does report that she was successful in tapering off her olanzapine.  Despite not taking Abilify, patient continues to endorse  good mood and denies depressive episodes.  Patient also endorses manageable anxiety and denies any new stressors at this time.  Patient reports no other issues or concerns at this time.  A PHQ-9 screen was performed with the patient scoring a 9.  A GAD-7 screen was also performed with the patient scoring a 6.  Patient is alert and oriented x 4, calm, cooperative, and fully engaged in conversation during the encounter.  Patient endorses great mood.  Patient denies suicidal or homicidal ideation.  She further denies auditory or visual hallucinations and does not appear to be responding to internal/external stimuli.  Patient endorses good sleep and receives on average 8 hours of sleep each night.  Patient endorses fair appetite and eats on average 2 meals per day.  Patient denies alcohol consumption, tobacco use, and illicit drug use.  Visit Diagnosis:    ICD-10-CM   1. MDD (major depressive disorder), recurrent, in partial remission  F33.41     2. GAD (generalized anxiety disorder)  F41.1     3. Social anxiety disorder  F40.10     4. Panic disorder  F41.0       Past Psychiatric History:  Major depressive disorder (recurrent, in partial remission) Generalized anxiety disorder Social anxiety disorder Panic disorder  Past Medical History:  Past Medical History:  Diagnosis Date   ADHD (attention deficit hyperactivity disorder)    Anxiety    Depression    Hx of staphylococcal septicemia     Past Surgical History:  Procedure Laterality Date   ABDOMINAL HYSTERECTOMY     BACK SURGERY     left elbow  surgery     VIDEO BRONCHOSCOPY N/A 01/27/2013   Procedure: VIDEO BRONCHOSCOPY WITH FLUORO;  Surgeon: Storm Frisk, MD;  Location: [facility] ENDOSCOPY;  Service: Cardiopulmonary;  Laterality: N/A;    Family Psychiatric History:  Patient believes that her mother may have had a psychiatric history but she does not know the specifics.   Family History: History reviewed. No pertinent family  history.  Social History:  Social History   Socioeconomic History   Marital status: Widowed    Spouse name: Not on file   Number of children: Not on file   Years of education: Not on file   Highest education level: Not on file  Occupational History   Not on file  Tobacco Use   Smoking status: Former    Packs/day: 1.00    Years: 20.00    Additional pack years: 0.00    Total pack years: 20.00    Types: Cigarettes   Smokeless tobacco: Never  Vaping Use   Vaping Use: Never used  Substance and Sexual Activity   Alcohol use: Not Currently   Drug use: Not Currently    Types: Marijuana   Sexual activity: Not on file  Other Topics Concern   Not on file  Social History Narrative   Not on file   Social Determinants of Health   Financial Resource Strain: Not on file  Food Insecurity: Patient Declined (09/05/2022)   Hunger Vital Sign    Worried About Running Out of Food in the Last Year: Patient declined    Ran Out of Food in the Last Year: Patient declined  Transportation Needs: Patient Declined (09/05/2022)   PRAPARE - Administrator, Civil Service (Medical): Patient declined    Lack of Transportation (Non-Medical): Patient declined  Physical Activity: Not on file  Stress: Not on file  Social Connections: Not on file    Allergies:  Allergies  Allergen Reactions   Penicillins Other (See Comments)    Childhood reaction- exact reaction not cited    Metabolic Disorder Labs: Lab Results  Component Value Date   HGBA1C 5.0 09/02/2022   MPG 96.8 09/02/2022   MPG 96.8 08/08/2022   Lab Results  Component Value Date   PROLACTIN 19.2 11/13/2020   Lab Results  Component Value Date   CHOL 180 09/02/2022   TRIG 38 09/02/2022   HDL 68 09/02/2022   CHOLHDL 2.6 09/02/2022   VLDL 8 09/02/2022   LDLCALC 104 (H) 09/02/2022   LDLCALC 133 (H) 08/08/2022   Lab Results  Component Value Date   TSH 1.564 08/31/2022   TSH 1.455 08/08/2022    Therapeutic Level  Labs: No results found for: "LITHIUM" No results found for: "VALPROATE" No results found for: "CBMZ"  Current Medications: Current Outpatient Medications  Medication Sig Dispense Refill   ARIPiprazole (ABILIFY) 5 MG tablet TAKE 1 TABLET (5 MG TOTAL) BY MOUTH DAILY. 90 tablet 1   clonazePAM (KLONOPIN) 0.5 MG tablet TAKE 1 TABLET BY MOUTH TWICE A DAY 60 tablet 0   gabapentin (NEURONTIN) 300 MG capsule Take 1 capsule (300 mg total) by mouth 3 (three) times daily. 90 capsule 1   hydrOXYzine (ATARAX) 25 MG tablet Take 1 tablet (25 mg total) by mouth 3 (three) times daily as needed for anxiety. 90 tablet 2   traZODone (DESYREL) 50 MG tablet Take 1 tablet (50 mg total) by mouth at bedtime as needed for sleep. 30 tablet 1   No current facility-administered medications for this visit.  Musculoskeletal: Strength & Muscle Tone: within normal limits Gait & Station: normal Patient leans: N/A  Psychiatric Specialty Exam: Review of Systems  Psychiatric/Behavioral:  Negative for decreased concentration, dysphoric mood, hallucinations, self-injury, sleep disturbance and suicidal ideas. The patient is not nervous/anxious and is not hyperactive.     There were no vitals taken for this visit.There is no height or weight on file to calculate BMI.  General Appearance: Casual  Eye Contact:  Good  Speech:  Clear and Coherent and Normal Rate  Volume:  Normal  Mood:  Euthymic  Affect:  Appropriate  Thought Process:  Coherent and Descriptions of Associations: Intact  Orientation:  Full (Time, Place, and Person)  Thought Content: WDL   Suicidal Thoughts:  No  Homicidal Thoughts:  No  Memory:  Immediate;   Fair Recent;   Fair Remote;   Fair  Judgement:  Fair  Insight:  Fair  Psychomotor Activity:  Normal  Concentration:  Concentration: Good and Attention Span: Good  Recall:  Good  Fund of Knowledge: Good  Language: Good  Akathisia:  No  Handed:  Right  AIMS (if indicated): not done  Assets:   Communication Skills Desire for Improvement Housing Social Support  ADL's:  Intact  Cognition: WNL  Sleep:  Good   Screenings: AIMS    Flowsheet Row [facility]) [facility]  AIMS Total Score 0 0      AUDIT    Flowsheet Row [facility]) [facility]-Admission (Discharged) [facility]) [facility]  Alcohol Use Disorder Identification Test Final Score (AUDIT) 1 1 0      GAD-7    Flowsheet Row Video Visit from 03/21/2023 in [facility] Video Visit from 02/06/2023 in [facility] Video Visit from 12/26/2022 in [facility] Video Visit from 03/29/2022 in [facility] Video Visit from 12/28/2021 in [facility]  Total GAD-7 Score 6 8 10 9 14       PHQ2-9    Flowsheet Row Video Visit from 03/21/2023 in [facility] Video Visit from 02/06/2023 in [facility] Video Visit from 12[facility]26/2022 in [facility] Office Visit from 10/08/2022 in [facility] Video Visit from 06/25/2022 in [facility]  PHQ-2 Total Score 2 0 0 0 4  PHQ-9 Total Score 9 -- -- 1 --      Flowsheet Row Video Visit from 03/21/2023 in [facility] Video Visit from 02/06/2023 in [facility] Video Visit from 12/26/2022 in [facility]  C-SSRS RISK CATEGORY No Risk No Risk No Risk        Assessment and Plan:   [name] is  a 52 year old, Caucasian female with a past psychiatric history significant for major depressive disorder (in partial remission), generalized anxiety disorder, social anxiety disorder, and panic disorder who presents to [facility] via virtual video visit for follow-up and medication management.  Patient reports no issues or concerns at this time.  Patient reports that she was able to successfully taper off her olanzapine.  Patient denies experiencing any  depression despite not taking her Abilify.  Patient also endorses manageable anxiety.  Prior to the conclusion of the encounter, patient inquired about when she can take her Abilify.  Provider informed the patient that she could take her Abilify in the daytime unless it causes drowsiness.  Patient vocalized understanding.  Patient to continue taking her medications as prescribed.  Patient's medications to be e-prescribed to pharmacy of choice.  Collaboration of Care: Collaboration of Care: Medication Management AEB provider managing patient's psychiatric medications, Primary Care Provider AEB patient being followed by a primary care provider, and Psychiatrist AEB patient being followed by mental health provider at this facility  Patient/Guardian was advised Release of Information must be obtained prior to any record release in order to collaborate their care with an outside provider. Patient/Guardian was advised if they have not already done so to contact the registration department to sign all necessary forms in order for us to release information regarding their care.   Consent: Patient/Guardian gives verbal consent for treatment and assignment of benefits for services provided during this visit. Patient/Guardian expressed understanding and agreed to proceed.   1. MDD (major depressive disorder), recurrent, in partial remission Patient to continue taking Abilify 5 mg daily for the management of her major depressive  disorder  - traZODone (DESYREL) 50 MG tablet; Take 1 tablet (50 mg total) by mouth at bedtime as needed for sleep.  Dispense: 30 tablet; Refill: 1  2. GAD (generalized anxiety disorder)  - gabapentin (NEURONTIN) 300 MG capsule; Take 1 capsule (300 mg total) by mouth 3 (three) times daily.  Dispense: 90 capsule; Refill: 1 - clonazePAM (KLONOPIN) 0.5 MG tablet; Take 1 tablet (0.5 mg total) by mouth 2 (two) times daily.  Dispense: 60 tablet; Refill: 0  3. Social anxiety disorder  - clonazePAM (KLONOPIN) 0.5 MG tablet; Take 1 tablet (0.5 mg total) by mouth 2 (two) times daily.  Dispense: 60 tablet; Refill: 0  4. Panic disorder  - clonazePAM (KLONOPIN) 0.5 MG tablet; Take 1 tablet (0.5 mg total) by mouth 2 (two) times daily.  Dispense: 60 tablet; Refill: 0  Patient to follow-up within 6 weeks Provider spent a total of 9 minutes with the patient/reviewing patient's chart  Meta HatchetUchenna E Anahy Esh, PA 03/23/2023, 2:21 PM

## 2023-03-29 ENCOUNTER — Other Ambulatory Visit (HOSPITAL_COMMUNITY): Payer: Self-pay | Admitting: Physician Assistant

## 2023-03-29 DIAGNOSIS — F410 Panic disorder [episodic paroxysmal anxiety] without agoraphobia: Secondary | ICD-10-CM

## 2023-03-29 DIAGNOSIS — F4010 Social phobia, unspecified: Secondary | ICD-10-CM

## 2023-03-29 DIAGNOSIS — F411 Generalized anxiety disorder: Secondary | ICD-10-CM

## 2023-04-01 ENCOUNTER — Other Ambulatory Visit (HOSPITAL_COMMUNITY): Payer: Self-pay | Admitting: Physician Assistant

## 2023-04-01 DIAGNOSIS — F410 Panic disorder [episodic paroxysmal anxiety] without agoraphobia: Secondary | ICD-10-CM

## 2023-04-01 DIAGNOSIS — F411 Generalized anxiety disorder: Secondary | ICD-10-CM

## 2023-04-01 DIAGNOSIS — F4010 Social phobia, unspecified: Secondary | ICD-10-CM

## 2023-04-10 ENCOUNTER — Telehealth (HOSPITAL_COMMUNITY): Payer: Medicare HMO | Admitting: Physician Assistant

## 2023-04-25 ENCOUNTER — Other Ambulatory Visit (HOSPITAL_COMMUNITY): Payer: Self-pay | Admitting: Physician Assistant

## 2023-04-25 DIAGNOSIS — F411 Generalized anxiety disorder: Secondary | ICD-10-CM

## 2023-04-25 DIAGNOSIS — F410 Panic disorder [episodic paroxysmal anxiety] without agoraphobia: Secondary | ICD-10-CM

## 2023-04-25 DIAGNOSIS — F4010 Social phobia, unspecified: Secondary | ICD-10-CM

## 2023-04-26 ENCOUNTER — Telehealth: Payer: Self-pay | Admitting: Family Medicine

## 2023-04-26 NOTE — Telephone Encounter (Signed)
Called patient to schedule Medicare Annual Wellness Visit (AWV). Left message for patient to call back and schedule Medicare Annual Wellness Visit (AWV).  Last date of AWV: awvi 09/16/10 per [facility]   Please transfer to Zella Ball [phone]  Thank you ,  Rudell Cobb AWV direct phone # [phone]

## 2023-05-02 ENCOUNTER — Telehealth (INDEPENDENT_AMBULATORY_CARE_PROVIDER_SITE_OTHER): Payer: Medicare HMO | Admitting: Physician Assistant

## 2023-05-02 DIAGNOSIS — F411 Generalized anxiety disorder: Secondary | ICD-10-CM | POA: Diagnosis not present

## 2023-05-02 DIAGNOSIS — F3341 Major depressive disorder, recurrent, in partial remission: Secondary | ICD-10-CM

## 2023-05-02 DIAGNOSIS — F4010 Social phobia, unspecified: Secondary | ICD-10-CM | POA: Diagnosis not present

## 2023-05-02 DIAGNOSIS — F410 Panic disorder [episodic paroxysmal anxiety] without agoraphobia: Secondary | ICD-10-CM | POA: Diagnosis not present

## 2023-05-03 ENCOUNTER — Encounter (HOSPITAL_COMMUNITY): Payer: Self-pay | Admitting: Physician Assistant

## 2023-05-03 MED ORDER — GABAPENTIN 300 MG PO CAPS
300.0000 mg | ORAL_CAPSULE | Freq: Three times a day (TID) | ORAL | 2 refills | Status: DC
Start: 2023-05-03 — End: 2023-07-11

## 2023-05-03 MED ORDER — ARIPIPRAZOLE 5 MG PO TABS
5.0000 mg | ORAL_TABLET | Freq: Every day | ORAL | 1 refills | Status: DC
Start: 2023-05-03 — End: 2023-07-11

## 2023-05-03 MED ORDER — HYDROXYZINE HCL 25 MG PO TABS
25.0000 mg | ORAL_TABLET | Freq: Three times a day (TID) | ORAL | 1 refills | Status: DC | PRN
Start: 2023-05-03 — End: 2024-04-08

## 2023-05-03 MED ORDER — TRAZODONE HCL 50 MG PO TABS
50.0000 mg | ORAL_TABLET | Freq: Every evening | ORAL | 2 refills | Status: DC | PRN
Start: 2023-05-03 — End: 2023-07-11

## 2023-05-03 NOTE — Progress Notes (Signed)
BH MD/PA/NP OP Progress Note  Virtual Visit via Video Note  I connected with [name] on 05/03/23 at  5:00 PM EDT by a video enabled telemedicine application and verified that I am speaking with the correct person using two identifiers.  Location: Patient: Home Provider: Clinic   I discussed the limitations of evaluation and management by telemedicine and the availability of in person appointments. The patient expressed understanding and agreed to proceed.  Follow Up Instructions:   I discussed the assessment and treatment plan with the patient. The patient was provided an opportunity to ask questions and all were answered. The patient agreed with the plan and demonstrated an understanding of the instructions.   The patient was advised to call back or seek an in-person evaluation if the symptoms worsen or if the condition fails to improve as anticipated.  I provided 12 minutes of non-face-to-face time during this encounter.  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  Patient is currently being managed on the following psychiatric medications:  Abilify 5 mg daily Klonopin 0.5 mg 2 times daily as needed Neurontin 300 mg 3 times daily Trazodone 50 mg at bedtime  Patient presents to the encounter in a good mood.  Patient reports no issues or concerns regarding her current medication regimen and denies the need for dosage adjustments at this time.  Patient denies depression but continues to  endorse some anxiety.  Patient rates her anxiety a 1-4 out of 10.  She reports that whenever she is in pain, her anxiety will worsen.  Patient also states that situations where she needs to remain calm also raise her anxiety.  Patient denies any new stressors at this time.  A PHQ-9 screen was performed with the patient scoring a 3.  A GAD-7 screen was also performed with the patient scoring a 12.  Patient is alert and oriented x 4, calm, cooperative, and fully engaged in conversation during the encounter.  Patient endorses good mood.  Patient denies suicidal or homicidal ideations.  She further denies auditory or visual hallucinations and does not appear to be responding to internal/external stimuli.  Patient endorses good sleep and receives on average 5 to 8 hours of sleep each night.  Patient endorses good appetite and eats on average 3 meals per day.  Patient denies alcohol consumption, tobacco use, and illicit drug use.  Visit Diagnosis:    ICD-10-CM   1. Social anxiety disorder  F40.10     2. MDD (major depressive disorder), recurrent, in partial remission (HCC)  F33.41 traZODone (DESYREL) 50 MG tablet    ARIPiprazole (ABILIFY) 5 MG tablet    hydrOXYzine (ATARAX) 25 MG tablet    3. GAD (generalized anxiety disorder)  F41.1 gabapentin (NEURONTIN) 300 MG capsule    4. Panic disorder  F41.0       Past Psychiatric History:  Major depressive disorder (recurrent, in partial remission) Generalized anxiety disorder Social anxiety disorder Panic disorder  Past Medical History:  Past Medical History:  Diagnosis Date   ADHD (attention  deficit hyperactivity disorder)    Anxiety    Depression    Hx of staphylococcal septicemia     Past Surgical History:  Procedure Laterality Date   ABDOMINAL HYSTERECTOMY     BACK SURGERY     left elbow surgery     VIDEO BRONCHOSCOPY N/A 01/27/2013   Procedure: VIDEO BRONCHOSCOPY WITH FLUORO;  Surgeon: Storm Frisk, MD;  Location: [facility] ENDOSCOPY;   Service: Cardiopulmonary;  Laterality: N/A;    Family Psychiatric History:  Patient believes that her mother may have had a psychiatric history but she does not know the specifics.   Family History: No family history on file.  Social History:  Social History   Socioeconomic History   Marital status: Widowed    Spouse name: Not on file   Number of children: Not on file   Years of education: Not on file   Highest education level: Not on file  Occupational History   Not on file  Tobacco Use   Smoking status: Former    Packs/day: 1.00    Years: 20.00    Additional pack years: 0.00    Total pack years: 20.00    Types: Cigarettes   Smokeless tobacco: Never  Vaping Use   Vaping Use: Never used  Substance and Sexual Activity   Alcohol use: Not Currently   Drug use: Not Currently    Types: Marijuana   Sexual activity: Not on file  Other Topics Concern   Not on file  Social History Narrative   Not on file   Social Determinants of Health   Financial Resource Strain: Not on file  Food Insecurity: Patient Declined (09/05/2022)   Hunger Vital Sign    Worried About Running Out of Food in the Last Year: Patient declined    Ran Out of Food in the Last Year: Patient declined  Transportation Needs: Patient Declined (09/05/2022)   PRAPARE - Administrator, Civil Service (Medical): Patient declined    Lack of Transportation (Non-Medical): Patient declined  Physical Activity: Not on file  Stress: Not on file  Social Connections: Not on file    Allergies:  Allergies  Allergen Reactions   Penicillins Other (See Comments)    Childhood reaction- exact reaction not cited    Metabolic Disorder Labs: Lab Results  Component Value Date   HGBA1C 5.0 09/02/2022   MPG 96.8 09/02/2022   MPG 96.8 08/08/2022   Lab Results  Component Value Date   PROLACTIN 19.2 11/13/2020   Lab Results  Component Value Date   CHOL 180 09/02/2022   TRIG 38 09/02/2022   HDL 68 09/02/2022    CHOLHDL 2.6 09/02/2022   VLDL 8 09/02/2022   LDLCALC 104 (H) 09/02/2022   LDLCALC 133 (H) 08/08/2022   Lab Results  Component Value Date   TSH 1.564 08/31/2022   TSH 1.455 08/08/2022    Therapeutic Level Labs: No results found for: "LITHIUM" No results found for: "VALPROATE" No results found for: "CBMZ"  Current Medications: Current Outpatient Medications  Medication Sig Dispense Refill   ARIPiprazole (ABILIFY) 5 MG tablet Take 1 tablet (5 mg total) by mouth daily. 90 tablet 1   clonazePAM (KLONOPIN) 0.5 MG tablet TAKE 1 TABLET BY MOUTH TWICE DAILY 60 tablet 0   gabapentin (NEURONTIN) 300 MG capsule Take 1 capsule (300 mg total) by mouth 3 (three) times daily. 90 capsule 2   hydrOXYzine (ATARAX) 25 MG tablet Take 1 tablet (25 mg total) by mouth 3 (three) times  daily as needed for anxiety. 75 tablet 1   traZODone (DESYREL) 50 MG tablet Take 1 tablet (50 mg total) by mouth at bedtime as needed for sleep. 30 tablet 2   No current facility-administered medications for this visit.     Musculoskeletal: Strength & Muscle Tone: within normal limits Gait & Station: normal Patient leans: N/A  Psychiatric Specialty Exam: Review of Systems  Psychiatric/Behavioral:  Negative for decreased concentration, dysphoric mood, hallucinations, self-injury, sleep disturbance and suicidal ideas. The patient is not nervous/anxious and is not hyperactive.     There were no vitals taken for this visit.There is no height or weight on file to calculate BMI.  General Appearance: Casual  Eye Contact:  Good  Speech:  Clear and Coherent and Normal Rate  Volume:  Normal  Mood:  Euthymic  Affect:  Appropriate  Thought Process:  Coherent and Descriptions of Associations: Intact  Orientation:  Full (Time, Place, and Person)  Thought Content: WDL   Suicidal Thoughts:  No  Homicidal Thoughts:  No  Memory:  Immediate;   Fair Recent;   Fair Remote;   Fair  Judgement:  Fair  Insight:  Fair   Psychomotor Activity:  Normal  Concentration:  Concentration: Good and Attention Span: Good  Recall:  Good  Fund of Knowledge: Good  Language: Good  Akathisia:  No  Handed:  Right  AIMS (if indicated): not done  Assets:  Communication Skills Desire for Improvement Housing Social Support  ADL's:  Intact  Cognition: WNL  Sleep:  Good   Screenings: AIMS    Flowsheet Row [facility]) [facility]  AIMS Total Score 0 0      AUDIT    Flowsheet Row [facility]) [facility]-Ad[facility] (Discharged[facility] ([facility]) [facility]  Alcohol Use Disorder Identification Test Final Score (AUDIT) 1 1 0      GAD-7    Flowsheet Row Video Visit from 05/02/2023 in [facility] Video Visit from 03/21/2023 in [facility] Video Visit from 02/06/2023 in [facility] Video Visit from 12/26/2022 in [facility] Video Visit from 03/29/2022 in [facility]  Total GAD-7 Score 12 6 8 10 9       PHQ2-9    Flowsheet Row Video Visit from 05/02/2023 in [facility] Video Visit from 03/21/2023 in [facility] Video Visit from 02/06/2023 in [facility] Video Visit from 12/26/2022 in [facility] Office Visit from 10/08/2022 in [facility]  PHQ-2 Total Score 0 2 0 0 0  PHQ-9 Total Score 3 9 -- -- 1      Flowsheet Row Video Visit from 05/02/2023 in [facility] Video Visit  from 03/21/2023 in [facility] Video Visit from 02/06/2023 in [facility]  C-SSRS RISK CATEGORY Low Risk No Risk No Risk        Assessment and Plan:   [name] is a 52 year old, Caucasian female with a past psychiatric history significant for 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  Patient reports no issues or concerns regarding her current medication regimen and denies the need for dosage adjustments at this time.  Patient denies depressive symptoms but does continue to endorse some anxiety.  Patient would like to continue taking her medications as prescribed.  Patient's medications to be e-prescribed to pharmacy of choice.  Provider about 4, at the end of the encounter to discuss potential adverse side effects to patient's current medication regimen.  Patient vocalized understanding.  Collaboration of Care: Collaboration of Care: Medication Management AEB provider managing patient's psychiatric medications, Primary Care Provider AEB patient being followed by a primary care provider, and Psychiatrist AEB patient being followed by mental health provider at this facility  Patient/Guardian was advised Release of Information must be obtained prior to any record release in order to collaborate their care with an outside provider. Patient/Guardian was advised if they have not already done so to contact the registration department to sign all necessary forms in order for [place] to release information regarding their care.   Consent: Patient/Guardian gives verbal consent for treatment and assignment of benefits for services provided during this visit. Patient/Guardian expressed understanding and agreed to proceed.   1. MDD (major  depressive disorder), recurrent, in partial remission (HCC)  - traZODone (DESYREL) 50 MG tablet; Take 1 tablet (50 mg total) by mouth at bedtime as needed for sleep.  Dispense: 30 tablet; Refill: 2 - ARIPiprazole (ABILIFY) 5 MG tablet; Take 1 tablet (5 mg total) by mouth daily.  Dispense: 90 tablet; Refill: 1 - hydrOXYzine (ATARAX) 25 MG tablet; Take 1 tablet (25 mg total) by mouth 3 (three) times daily as needed for anxiety.  Dispense: 75 tablet; Refill: 1  2. GAD (generalized anxiety disorder) Patient to continue taking clonazepam 0.5 mg 2 times daily as needed for the management of her generalized anxiety disorder  - gabapentin (NEURONTIN) 300 MG capsule; Take 1 capsule (300 mg total) by mouth 3 (three) times daily.  Dispense: 90 capsule; Refill: 2  3. Social anxiety disorder Patient to continue taking clonazepam 0.5 mg 2 times daily as needed for the management of her social anxiety disorder  4. Panic disorder Patient to continue taking clonazepam 0.5 mg 2 times daily as needed for the management of her panic attacks  Patient to follow-up within 6 weeks Provider spent a total of 9 minutes with the patient/reviewing patient's chart  Meta Hatchet, PA 05/03/2023, 1:48 AM

## 2023-06-04 ENCOUNTER — Other Ambulatory Visit (HOSPITAL_COMMUNITY): Payer: Self-pay | Admitting: Physician Assistant

## 2023-06-04 DIAGNOSIS — F410 Panic disorder [episodic paroxysmal anxiety] without agoraphobia: Secondary | ICD-10-CM

## 2023-06-04 DIAGNOSIS — F4010 Social phobia, unspecified: Secondary | ICD-10-CM

## 2023-06-04 DIAGNOSIS — F411 Generalized anxiety disorder: Secondary | ICD-10-CM

## 2023-06-05 ENCOUNTER — Telehealth (HOSPITAL_COMMUNITY): Payer: Self-pay | Admitting: *Deleted

## 2023-06-05 NOTE — Telephone Encounter (Signed)
Patient called asking for refill of Klonopin to be sent to [facility]. No appointment scheduled.

## 2023-06-05 NOTE — Telephone Encounter (Signed)
Message acknowledged and reviewed.  Patient medication to be e-prescribed to pharmacy of choice.  Patient's next appointment with this provider is scheduled for 07/10/2023.

## 2023-07-04 ENCOUNTER — Other Ambulatory Visit (HOSPITAL_COMMUNITY): Payer: Self-pay | Admitting: Physician Assistant

## 2023-07-04 DIAGNOSIS — F410 Panic disorder [episodic paroxysmal anxiety] without agoraphobia: Secondary | ICD-10-CM

## 2023-07-04 DIAGNOSIS — F411 Generalized anxiety disorder: Secondary | ICD-10-CM

## 2023-07-04 DIAGNOSIS — F4010 Social phobia, unspecified: Secondary | ICD-10-CM

## 2023-07-10 ENCOUNTER — Telehealth (HOSPITAL_COMMUNITY): Payer: Medicaid Other | Admitting: Physician Assistant

## 2023-07-10 DIAGNOSIS — F3341 Major depressive disorder, recurrent, in partial remission: Secondary | ICD-10-CM

## 2023-07-10 DIAGNOSIS — F4010 Social phobia, unspecified: Secondary | ICD-10-CM | POA: Diagnosis not present

## 2023-07-10 DIAGNOSIS — F410 Panic disorder [episodic paroxysmal anxiety] without agoraphobia: Secondary | ICD-10-CM

## 2023-07-10 DIAGNOSIS — F411 Generalized anxiety disorder: Secondary | ICD-10-CM

## 2023-07-11 ENCOUNTER — Encounter (HOSPITAL_COMMUNITY): Payer: Self-pay | Admitting: Physician Assistant

## 2023-07-11 ENCOUNTER — Other Ambulatory Visit (HOSPITAL_COMMUNITY): Payer: Self-pay | Admitting: Physician Assistant

## 2023-07-11 DIAGNOSIS — F410 Panic disorder [episodic paroxysmal anxiety] without agoraphobia: Secondary | ICD-10-CM

## 2023-07-11 DIAGNOSIS — F411 Generalized anxiety disorder: Secondary | ICD-10-CM

## 2023-07-11 DIAGNOSIS — F4010 Social phobia, unspecified: Secondary | ICD-10-CM

## 2023-07-11 MED ORDER — ARIPIPRAZOLE 5 MG PO TABS
5.0000 mg | ORAL_TABLET | Freq: Every day | ORAL | 1 refills | Status: DC
Start: 2023-07-11 — End: 2023-09-11

## 2023-07-11 MED ORDER — TRAZODONE HCL 50 MG PO TABS
50.0000 mg | ORAL_TABLET | Freq: Every evening | ORAL | 2 refills | Status: DC | PRN
Start: 2023-07-11 — End: 2023-10-09

## 2023-07-11 MED ORDER — GABAPENTIN 300 MG PO CAPS
300.0000 mg | ORAL_CAPSULE | Freq: Three times a day (TID) | ORAL | 2 refills | Status: DC
Start: 2023-07-11 — End: 2023-10-09

## 2023-07-11 MED ORDER — CLONAZEPAM 0.5 MG PO TABS
0.5000 mg | ORAL_TABLET | Freq: Two times a day (BID) | ORAL | 0 refills | Status: DC
Start: 2023-08-04 — End: 2023-09-10

## 2023-07-11 NOTE — Progress Notes (Signed)
BH MD/PA/NP OP Progress Note  Virtual Visit via Video Note  I connected with [name] on 07/11/23 at  3:30 PM EDT by a video enabled telemedicine application and verified that I am speaking with the correct person using two identifiers.  Location: Patient: Home Provider: Clinic   I discussed the limitations of evaluation and management by telemedicine and the availability of in person appointments. The patient expressed understanding and agreed to proceed.  Follow Up Instructions:   I discussed the assessment and treatment plan with the patient. The patient was provided an opportunity to ask questions and all were answered. The patient agreed with the plan and demonstrated an understanding of the instructions.   The patient was advised to call back or seek an in-person evaluation if the symptoms worsen or if the condition fails to improve as anticipated.  I provided 6 minutes of non-face-to-face time during this encounter.  Meta Hatchet, PA    07/11/2023 7:13 PM [name]  MRN:  [id number]  Chief Complaint:  No chief complaint on file.  HPI:   [name] is a 52 year old, Caucasian female with a past psychiatric history significant for major depressive disorder (in partial remission), generalized anxiety disorder, social anxiety disorder, and panic disorder who presents to [facility] via virtual video visit for follow-up and medication management.  Patient is currently being managed on the following psychiatric medications:  Abilify 5 mg daily Klonopin 0.5 mg 2 times daily as needed Neurontin 300 mg 3 times daily Trazodone 50 mg at bedtime  Patient reports no issues or concerns regarding her current medication regimen.  Patient denies experiencing adverse side effects and further denies the need for dosage adjustments at this time.  Patient denies depressive symptoms at this time but does endorse some anxiety she rates  it 2-3 out of 10.  Patient denies any immediate stressors but states that she is trying to get her spare tire changed following the conclusion of the encounter.  Patient endorses stability on her current medication regimen.  Patient is alert and oriented x 4, calm, cooperative, and fully engaged in conversation during the encounter.  Patient endorses good mood.  Patient denies suicidal or homicidal ideations.  She further denies auditory or visual hallucinations and does not appear to be responding to internal/external stimuli.  Patient endorses good sleep and receives on average 8 hours of sleep per night.  Patient endorses good appetite and eats on average 3 meals per day.  Patient denies alcohol consumption, tobacco use, or illicit drug use.  Visit Diagnosis:    ICD-10-CM   1. MDD (major depressive disorder), recurrent, in partial remission (HCC)  F33.41 traZODone (DESYREL) 50 MG tablet    ARIPiprazole (ABILIFY) 5 MG tablet    2. GAD (generalized anxiety disorder)  F41.1 gabapentin (NEURONTIN) 300 MG capsule    clonazePAM (KLONOPIN) 0.5 MG tablet    3. Social anxiety disorder  F40.10 clonazePAM (KLONOPIN) 0.5 MG tablet    4. Panic disorder  F41.0 clonazePAM (KLONOPIN) 0.5 MG tablet      Past Psychiatric History:  Major depressive disorder (recurrent, in partial remission) Generalized anxiety disorder Social anxiety disorder Panic disorder  Past Medical History:  Past Medical History:  Diagnosis Date   ADHD (attention deficit hyperactivity disorder)    Anxiety    Depression    Hx of staphylococcal septicemia     Past Surgical History:  Procedure Laterality Date   ABDOMINAL HYSTERECTOMY  BACK SURGERY     left elbow surgery     VIDEO BRONCHOSCOPY N/A 01/27/2013   Procedure: VIDEO BRONCHOSCOPY WITH FLUORO;  Surgeon: Storm Frisk, MD;  Location: [facility] ENDOSCOPY;  Service: Cardiopulmonary;  Laterality: N/A;    Family Psychiatric History:  Patient believes that her mother may  have had a psychiatric history but she does not know the specifics.   Family History: History reviewed. No pertinent family history.  Social History:  Social History   Socioeconomic History   Marital status: Widowed    Spouse name: Not on file   Number of children: Not on file   Years of education: Not on file   Highest education level: Not on file  Occupational History   Not on file  Tobacco Use   Smoking status: Former    Current packs/day: 1.00    Average packs/day: 1 pack/day for 20.0 years (20.0 ttl pk-yrs)    Types: Cigarettes   Smokeless tobacco: Never  Vaping Use   Vaping status: Never Used  Substance and Sexual Activity   Alcohol use: Not Currently   Drug use: Not Currently    Types: Marijuana   Sexual activity: Not on file  Other Topics Concern   Not on file  Social History Narrative   Not on file   Social Determinants of Health   Financial Resource Strain: Not on file  Food Insecurity: Patient Declined (09/05/2022)   Hunger Vital Sign    Worried About Running Out of Food in the Last Year: Patient declined    Ran Out of Food in the Last Year: Patient declined  Transportation Needs: Patient Declined (09/05/2022)   PRAPARE - Administrator, Civil Service (Medical): Patient declined    Lack of Transportation (Non-Medical): Patient declined  Physical Activity: Not on file  Stress: Not on file  Social Connections: Unknown (04/27/2022)   Received from [facility]   Social Network    Social Network: Not on file    Allergies:  Allergies  Allergen Reactions   Penicillins Other (See Comments)    Childhood reaction- exact reaction not cited    Metabolic Disorder Labs: Lab Results  Component Value Date   HGBA1C 5.0 09/02/2022   MPG 96.8 09/02/2022   MPG 96.8 08/08/2022   Lab Results  Component Value Date   PROLACTIN 19.2 11/13/2020   Lab Results  Component Value Date   CHOL 180 09/02/2022   TRIG 38 09/02/2022   HDL 68 09/02/2022    CHOLHDL 2.6 09/02/2022   VLDL 8 09/02/2022   LDLCALC 104 (H) 09/02/2022   LDLCALC 133 (H) 08/08/2022   Lab Results  Component Value Date   TSH 1.564 08/31/2022   TSH 1.455 08/08/2022    Therapeutic Level Labs: No results found for: "LITHIUM" No results found for: "VALPROATE" No results found for: "CBMZ"  Current Medications: Current Outpatient Medications  Medication Sig Dispense Refill   ARIPiprazole (ABILIFY) 5 MG tablet Take 1 tablet (5 mg total) by mouth daily. 90 tablet 1   [START ON 08/04/2023] clonazePAM (KLONOPIN) 0.5 MG tablet Take 1 tablet (0.5 mg total) by mouth 2 (two) times daily. 60 tablet 0   gabapentin (NEURONTIN) 300 MG capsule Take 1 capsule (300 mg total) by mouth 3 (three) times daily. 90 capsule 2   hydrOXYzine (ATARAX) 25 MG tablet Take 1 tablet (25 mg total) by mouth 3 (three) times daily as needed for anxiety. 75 tablet 1   traZODone (DESYREL) 50 MG tablet Take  1 tablet (50 mg total) by mouth at bedtime as needed for sleep. 30 tablet 2   No current facility-administered medications for this visit.     Musculoskeletal: Strength & Muscle Tone: within normal limits Gait & Station: normal Patient leans: N/A  Psychiatric Specialty Exam: Review of Systems  Psychiatric/Behavioral:  Negative for decreased concentration, dysphoric mood, hallucinations, self-injury, sleep disturbance and suicidal ideas. The patient is not nervous/anxious and is not hyperactive.     There were no vitals taken for this visit.There is no height or weight on file to calculate BMI.  General Appearance: Casual  Eye Contact:  Good  Speech:  Clear and Coherent and Normal Rate  Volume:  Normal  Mood:  Euthymic  Affect:  Appropriate  Thought Process:  Coherent and Descriptions of Associations: Intact  Orientation:  Full (Time, Place, and Person)  Thought Content: WDL   Suicidal Thoughts:  No  Homicidal Thoughts:  No  Memory:  Immediate;   Fair Recent;   Fair Remote;   Fair   Judgement:  Fair  Insight:  Fair  Psychomotor Activity:  Normal  Concentration:  Concentration: Good and Attention Span: Good  Recall:  Good  Fund of Knowledge: Good  Language: Good  Akathisia:  No  Handed:  Right  AIMS (if indicated): not done  Assets:  Communication Skills Desire for Improvement Housing Social Support  ADL's:  Intact  Cognition: WNL  Sleep:  Good   Screenings: AIMS    Flowsheet Row [facility]  AIMS Total Score 0 0      AUDIT    Flowsheet Row [facility]) [facility]-Ad[facility] (Discharged[facility] ([facility]) [facility]  Alcohol Use Disorder Identification Test Final Score (AUDIT) 1 1 0      GAD-7    Flowsheet Row Video Visit from 07/10/2023 in [facility] Video Visit from 05/02/2023 in [facility] Video Visit from 03/21/2023 in [facility] Video Visit from 02/06/2023 in [facility] Video Visit from 12/26/2022 in [facility]  Total GAD-7 Score 8 12 6 8 10       PHQ2-9    Flowsheet Row Video Visit from 07/10/2023 in [facility] Video Visit from 05/02/2023 in [facility] Video Visit from 03/21/2023 in [facility] Video Visit from 02/06/2023 in [facility] Video Visit from 12/26/2022 in [facility]  PHQ-2 Total Score 0 0 2 0 0  PHQ-9 Total Score -- 3 9 -- --      Flowsheet Row Video Visit from 07/10/2023 in [facility] Video Visit from 05/02/2023 in [facility] Video Visit from 03/21/2023 in [facility]  C-SSRS RISK CATEGORY Low Risk Low Risk No Risk        Assessment and Plan:   [name] is a 52 year old, Caucasian female with a past psychiatric history significant for major depressive disorder (in partial remission), generalized anxiety disorder, social anxiety disorder, and panic disorder who presents to [facility] via virtual video visit for follow-up and medication management.  Patient reports no issues or concerns regarding her current medication regimen.  Patient denies experiencing any adverse side effects and further denies the need for dosage adjustments at this time.  Patient denies depressive symptoms but does endorse some anxiety on occasion.  Patient endorses stability on her current medication regimen and will continue taking medications as prescribed.  Patient's medications to be e-prescribed to pharmacy of choice.  Collaboration of Care: Collaboration of Care: Medication Management AEB provider managing patient's psychiatric medications, Primary Care Provider AEB patient being followed by a primary care provider, and Psychiatrist AEB patient being followed by mental health provider at this facility  Patient/Guardian was advised Release of Information must be obtained prior to any record release in order to collaborate their care with an outside provider. Patient/Guardian was advised if they have not already done so to contact the registration department to sign all necessary forms in order for [place] to release information regarding their care.   Consent: Patient/Guardian gives verbal consent for treatment and assignment of benefits for services provided during this visit. Patient/Guardian expressed understanding and agreed to proceed.   1. MDD (major depressive disorder),  recurrent, in partial remission (HCC)  - traZODone (DESYREL) 50 MG tablet; Take 1 tablet (50 mg total) by mouth at bedtime as needed for sleep.  Dispense: 30 tablet; Refill: 2 - ARIPiprazole (ABILIFY) 5 MG tablet; Take 1 tablet (5 mg total) by mouth daily.  Dispense: 90 tablet; Refill: 1  2. GAD (generalized anxiety disorder)  - gabapentin (NEURONTIN) 300 MG capsule; Take 1 capsule (300 mg total) by mouth 3 (three) times daily.  Dispense: 90 capsule; Refill: 2 - clonazePAM (KLONOPIN) 0.5 MG tablet; Take 1 tablet (0.5 mg total) by mouth 2 (two) times daily.  Dispense: 60 tablet; Refill: 0  3. Social anxiety disorder  - clonazePAM (KLONOPIN) 0.5 MG tablet; Take 1 tablet (0.5 mg total) by mouth 2 (two) times daily.  Dispense: 60 tablet; Refill: 0  4. Panic disorder  - clonazePAM (KLONOPIN) 0.5 MG tablet; Take 1 tablet (0.5 mg total) by mouth 2 (two) times daily.  Dispense: 60 tablet; Refill: 0  Patient to follow-up within 3 months Provider spent a total of 6 minutes with the patient/reviewing patient's chart  Meta Hatchet, PA 07/11/2023, 7:13 PM

## 2023-09-05 ENCOUNTER — Other Ambulatory Visit (HOSPITAL_COMMUNITY): Payer: Self-pay | Admitting: Physician Assistant

## 2023-09-05 DIAGNOSIS — F4010 Social phobia, unspecified: Secondary | ICD-10-CM

## 2023-09-05 DIAGNOSIS — F410 Panic disorder [episodic paroxysmal anxiety] without agoraphobia: Secondary | ICD-10-CM

## 2023-09-05 DIAGNOSIS — F411 Generalized anxiety disorder: Secondary | ICD-10-CM

## 2023-09-10 ENCOUNTER — Other Ambulatory Visit (HOSPITAL_COMMUNITY): Payer: Self-pay | Admitting: Physician Assistant

## 2023-09-10 ENCOUNTER — Telehealth (HOSPITAL_COMMUNITY): Payer: Self-pay | Admitting: *Deleted

## 2023-09-10 DIAGNOSIS — F4010 Social phobia, unspecified: Secondary | ICD-10-CM

## 2023-09-10 DIAGNOSIS — F3341 Major depressive disorder, recurrent, in partial remission: Secondary | ICD-10-CM

## 2023-09-10 DIAGNOSIS — F410 Panic disorder [episodic paroxysmal anxiety] without agoraphobia: Secondary | ICD-10-CM

## 2023-09-10 DIAGNOSIS — F411 Generalized anxiety disorder: Secondary | ICD-10-CM

## 2023-09-10 NOTE — Telephone Encounter (Signed)
Patient called asking for refill of Klonopin. Next appointment in October.

## 2023-09-11 NOTE — Telephone Encounter (Signed)
Message acknowledged and reviewed.

## 2023-10-09 ENCOUNTER — Encounter (HOSPITAL_COMMUNITY): Payer: Self-pay | Admitting: Physician Assistant

## 2023-10-09 ENCOUNTER — Telehealth (HOSPITAL_COMMUNITY): Payer: Medicare HMO | Admitting: Physician Assistant

## 2023-10-09 DIAGNOSIS — F4010 Social phobia, unspecified: Secondary | ICD-10-CM

## 2023-10-09 DIAGNOSIS — F411 Generalized anxiety disorder: Secondary | ICD-10-CM

## 2023-10-09 DIAGNOSIS — F410 Panic disorder [episodic paroxysmal anxiety] without agoraphobia: Secondary | ICD-10-CM

## 2023-10-09 DIAGNOSIS — F3341 Major depressive disorder, recurrent, in partial remission: Secondary | ICD-10-CM

## 2023-10-09 MED ORDER — CLONAZEPAM 0.5 MG PO TABS
0.5000 mg | ORAL_TABLET | Freq: Two times a day (BID) | ORAL | 0 refills | Status: DC
Start: 2023-10-10 — End: 2023-11-07

## 2023-10-09 MED ORDER — TRAZODONE HCL 50 MG PO TABS: 50.0000 mg | ORAL_TABLET | Freq: Every evening | ORAL | 2 refills | Status: DC | PRN

## 2023-10-09 MED ORDER — GABAPENTIN 300 MG PO CAPS
300.0000 mg | ORAL_CAPSULE | Freq: Three times a day (TID) | ORAL | 2 refills | Status: DC
Start: 2023-10-09 — End: 2024-01-08

## 2023-10-09 MED ORDER — ARIPIPRAZOLE 5 MG PO TABS
5.0000 mg | ORAL_TABLET | Freq: Every day | ORAL | 1 refills | Status: DC
Start: 2023-10-09 — End: 2024-01-08

## 2023-10-09 NOTE — Progress Notes (Signed)
BH MD/PA/NP OP Progress Note  Virtual Visit via Video Note  I connected with [name] on 10/09/23 at  3:00 PM EDT by a video enabled telemedicine application and verified that I am speaking with the correct person using two identifiers.  Location: Patient: Home Provider: Clinic   I discussed the limitations of evaluation and management by telemedicine and the availability of in person appointments. The patient expressed understanding and agreed to proceed.  Follow Up Instructions:   I discussed the assessment and treatment plan with the patient. The patient was provided an opportunity to ask questions and all were answered. The patient agreed with the plan and demonstrated an understanding of the instructions.   The patient was advised to call back or seek an in-person evaluation if the symptoms worsen or if the condition fails to improve as anticipated.  I provided 11 minutes of non-face-to-face time during this encounter.  Meta Hatchet, PA    10/09/2023 3:25 PM [name]  MRN:  [id number]  Chief Complaint:  Chief Complaint  Patient presents with   Follow-up   Medication Refill   HPI:   [name] is a 52 year old, Caucasian female with a past psychiatric history significant for major depressive disorder (in partial remission), generalized anxiety disorder, social anxiety disorder, and panic disorder who presents to [facility] via virtual video visit for follow-up and medication management.  Patient is currently being managed on the following psychiatric medications:  Abilify 5 mg daily Klonopin 0.5 mg 2 times daily as needed Neurontin 300 mg 3 times daily Trazodone 50 mg at bedtime  Patient presents today encounter endorsing good mood, despite being busy at work.  Patient denies overt depressive symptoms.  She endorses fluctuating anxiety but states that her anxiety has not been bad for the most part.  Patient  rates her anxiety as 6 out of 10 but denies any new stressors at this time.  A GAD-7 screen was performed with the patient scoring a 15.  Patient is alert and oriented x 4, calm, cooperative, and fully engaged in conversation during the encounter.  Patient describes her mood as really good.  Patient denies suicidal or homicidal ideations.  She further denies auditory or visual hallucinations and does not appear to be responding to internal/external stimuli.  Patient endorses fair sleep and receives average 5 to 8 hours of sleep per night.  Patient endorses good appetite and eats on average 3 small meals per day.  Patient denies alcohol consumption.  She denies tobacco use but does engage in vaping.  Patient denies illicit drug use.  Visit Diagnosis:    ICD-10-CM   1. GAD (generalized anxiety disorder)  F41.1 clonazePAM (KLONOPIN) 0.5 MG tablet    gabapentin (NEURONTIN) 300 MG capsule    2. Social anxiety disorder  F40.10 clonazePAM (KLONOPIN) 0.5 MG tablet    3. Panic disorder  F41.0 clonazePAM (KLONOPIN) 0.5 MG tablet    4. MDD (major depressive disorder), recurrent, in partial remission (HCC)  F33.41 ARIPiprazole (ABILIFY) 5 MG tablet    traZODone (DESYREL) 50 MG tablet      Past Psychiatric History:  Major depressive disorder (recurrent, in partial remission) Generalized anxiety disorder Social anxiety disorder Panic disorder  Past Medical History:  Past Medical History:  Diagnosis Date   ADHD (attention deficit hyperactivity disorder)    Anxiety    Depression    Hx of staphylococcal septicemia     Past Surgical History:  Procedure  Laterality Date   ABDOMINAL HYSTERECTOMY     BACK SURGERY     left elbow surgery     VIDEO BRONCHOSCOPY N/A 01/27/2013   Procedure: VIDEO BRONCHOSCOPY WITH FLUORO;  Surgeon: Storm Frisk, MD;  Location: [facility] ENDOSCOPY;  Service: Cardiopulmonary;  Laterality: N/A;    Family Psychiatric History:  Patient believes that her mother may have had a  psychiatric history but she does not know the specifics.   Family History: History reviewed. No pertinent family history.  Social History:  Social History   Socioeconomic History   Marital status: Widowed    Spouse name: Not on file   Number of children: Not on file   Years of education: Not on file   Highest education level: Not on file  Occupational History   Not on file  Tobacco Use   Smoking status: Former    Current packs/day: 1.00    Average packs/day: 1 pack/day for 20.0 years (20.0 ttl pk-yrs)    Types: Cigarettes   Smokeless tobacco: Never  Vaping Use   Vaping status: Never Used  Substance and Sexual Activity   Alcohol use: Not Currently   Drug use: Not Currently    Types: Marijuana   Sexual activity: Not on file  Other Topics Concern   Not on file  Social History Narrative   Not on file   Social Determinants of Health   Financial Resource Strain: Not on file  Food Insecurity: Patient Declined (09/05/2022)   Hunger Vital Sign    Worried About Running Out of Food in the Last Year: Patient declined    Ran Out of Food in the Last Year: Patient declined  Transportation Needs: Patient Declined (09/05/2022)   PRAPARE - Administrator, Civil Service (Medical): Patient declined    Lack of Transportation (Non-Medical): Patient declined  Physical Activity: Not on file  Stress: Not on file  Social Connections: Unknown (04/27/2022)   Received from [facility]   Social Network    Social Network: Not on file    Allergies:  Allergies  Allergen Reactions   Penicillins Other (See Comments)    Childhood reaction- exact reaction not cited    Metabolic Disorder Labs: Lab Results  Component Value Date   HGBA1C 5.0 09/02/2022   MPG 96.8 09/02/2022   MPG 96.8 08/08/2022   Lab Results  Component Value Date   PROLACTIN 19.2 11/13/2020   Lab Results  Component Value Date   CHOL 180 09/02/2022   TRIG 38 09/02/2022   HDL 68 09/02/2022    CHOLHDL 2.6 09/02/2022   VLDL 8 09/02/2022   LDLCALC 104 (H) 09/02/2022   LDLCALC 133 (H) 08/08/2022   Lab Results  Component Value Date   TSH 1.564 08/31/2022   TSH 1.455 08/08/2022    Therapeutic Level Labs: No results found for: "LITHIUM" No results found for: "VALPROATE" No results found for: "CBMZ"  Current Medications: Current Outpatient Medications  Medication Sig Dispense Refill   ARIPiprazole (ABILIFY) 5 MG tablet Take 1 tablet (5 mg total) by mouth daily. 90 tablet 1   [START ON 10/10/2023] clonazePAM (KLONOPIN) 0.5 MG tablet Take 1 tablet (0.5 mg total) by mouth 2 (two) times daily. 60 tablet 0   gabapentin (NEURONTIN) 300 MG capsule Take 1 capsule (300 mg total) by mouth 3 (three) times daily. 90 capsule 2   hydrOXYzine (ATARAX) 25 MG tablet Take 1 tablet (25 mg total) by mouth 3 (three) times daily as needed for  anxiety. 75 tablet 1   traZODone (DESYREL) 50 MG tablet Take 1 tablet (50 mg total) by mouth at bedtime as needed for sleep. 30 tablet 2   No current facility-administered medications for this visit.     Musculoskeletal: Strength & Muscle Tone: within normal limits Gait & Station: normal Patient leans: N/A  Psychiatric Specialty Exam: Review of Systems  Psychiatric/Behavioral:  Negative for decreased concentration, dysphoric mood, hallucinations, self-injury, sleep disturbance and suicidal ideas. The patient is nervous/anxious. The patient is not hyperactive.     There were no vitals taken for this visit.There is no height or weight on file to calculate BMI.  General Appearance: Casual  Eye Contact:  Good  Speech:  Clear and Coherent and Normal Rate  Volume:  Normal  Mood:  Euthymic  Affect:  Appropriate  Thought Process:  Coherent and Descriptions of Associations: Intact  Orientation:  Full (Time, Place, and Person)  Thought Content: WDL   Suicidal Thoughts:  No  Homicidal Thoughts:  No  Memory:  Immediate;   Fair Recent;   Fair Remote;   Fair   Judgement:  Fair  Insight:  Fair  Psychomotor Activity:  Normal  Concentration:  Concentration: Good and Attention Span: Good  Recall:  Good  Fund of Knowledge: Good  Language: Good  Akathisia:  No  Handed:  Right  AIMS (if indicated): not done  Assets:  Communication Skills Desire for Improvement Housing Social Support  ADL's:  Intact  Cognition: WNL  Sleep:  Good   Screenings: AIMS    Flowsheet Row [facility]  AIMS Total Score 0 0      AUDIT    Flowsheet Row [facility]) [facility]-Ad[facility] (Discharged[facility] ([facility]) [facility]  Alcohol Use Disorder Identification Test Final Score (AUDIT) 1 1 0      GAD-7    Flowsheet Row Video Visit from 10/09/2023 in [facility] Video Visit from 07/10/2023 in [facility] Video Visit from 05/02/2023 in [facility] Video Visit from 03/21/2023 in [facility] Video Visit from 02/06/2023 in [facility]  Total GAD-7 Score 10 8 12 6 8       PHQ2-9    Flowsheet Row Video Visit from 10/09/2023 in [facility] Video Visit from 07/10/2023 in [facility] Video Visit from 05/02/2023 in [facility] Video Visit from 03/21/2023 in [facility] Video Visit from 02/06/2023 in [facility]  PHQ-2 Total Score 1 0 0 2 0  PHQ-9 Total Score -- -- 3 9 --      Flowsheet Row Video Visit from 10/09/2023 in [facility] Video Visit from 07/10/2023 in [facility] Video Visit from 05/02/2023 in [facility]  C-SSRS RISK CATEGORY Error: Q6 is Yes, you must answer 7 Low Risk Low Risk        Assessment and Plan:   [name] is a 52 year old, Caucasian female with a past psychiatric history significant for major depressive disorder (in partial remission), generalized anxiety disorder, social anxiety disorder, and panic disorder who  presents to [facility] via virtual video visit for follow-up and medication management.  Patient presents to the encounter endorsing good mood.  Patient denies overt depressive symptoms but does endorse fluctuating anxiety.  Patient attributes her anxiety to her busy work schedule but denies any new stressors at this time.  Patient endorses stability on her current medication regimen and denies the need for dosage adjustments at this time.  Patient's medications to be e-prescribed to pharmacy of choice.  Collaboration of Care: Collaboration of Care: Medication Management AEB provider managing patient's psychiatric medications, Primary Care Provider AEB patient being followed by a primary care provider, and Psychiatrist AEB patient being followed by mental health provider at this facility  Patient/Guardian was advised Release of Information must be obtained prior to any record release in order to collaborate their care with an outside provider. Patient/Guardian was advised if they have not already done so to contact the registration department to sign all necessary forms in order for [place] to release information regarding their care.   Consent: Patient/Guardian gives verbal consent for treatment and assignment of benefits for services provided during this visit. Patient/Guardian expressed understanding and agreed to proceed.   1. GAD (generalized anxiety disorder)  -  clonazePAM (KLONOPIN) 0.5 MG tablet; Take 1 tablet (0.5 mg total) by mouth 2 (two) times daily.  Dispense: 60 tablet; Refill: 0 - gabapentin (NEURONTIN) 300 MG capsule; Take 1 capsule (300 mg total) by mouth 3 (three) times daily.  Dispense: 90 capsule; Refill: 2  2. Social anxiety disorder  - clonazePAM (KLONOPIN) 0.5 MG tablet; Take 1 tablet (0.5 mg total) by mouth 2 (two) times daily.  Dispense: 60 tablet; Refill: 0  3. Panic disorder  - clonazePAM (KLONOPIN) 0.5 MG tablet; Take 1 tablet (0.5 mg total) by mouth 2 (two) times daily.  Dispense: 60 tablet; Refill: 0  4. MDD (major depressive disorder), recurrent, in partial remission (HCC)  - ARIPiprazole (ABILIFY) 5 MG tablet; Take 1 tablet (5 mg total) by mouth daily.  Dispense: 90 tablet; Refill: 1 - traZODone (DESYREL) 50 MG tablet; Take 1 tablet (50 mg total) by mouth at bedtime as needed for sleep.  Dispense: 30 tablet; Refill: 2  Patient to follow-up within 3 months Provider spent a total of 11 minutes with the patient/reviewing patient's chart  Meta Hatchet, PA 10/09/2023, 3:25 PM

## 2023-10-30 IMAGING — CT CT HEAD W/O CM
3 of 4 series · 16 of 47 positions shown, 19 images · non-contrast
Comparison: None.

CLINICAL DATA: Blurry vision, migraine headache.

EXAM:
CT HEAD WITHOUT CONTRAST
TECHNIQUE: Contiguous axial images were obtained from the base of the skull
through the vertex without intravenous contrast.

[Series 4: head 2.0 h70h · axial · 0.43mm/px · z∈[-137,+7]mm · 10 of 80 slices shown, 13 images]
[im 4/80  brain]
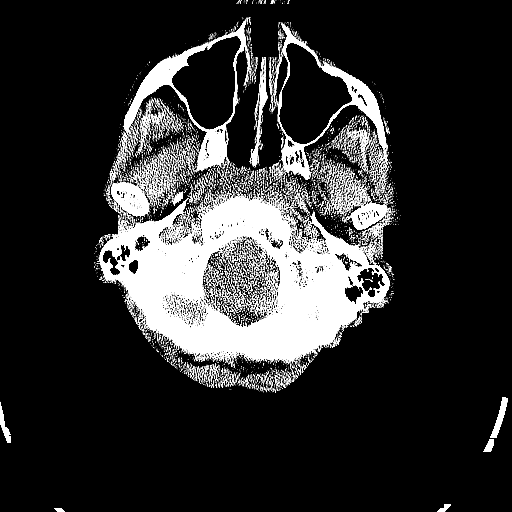
[im 4/80  bone]
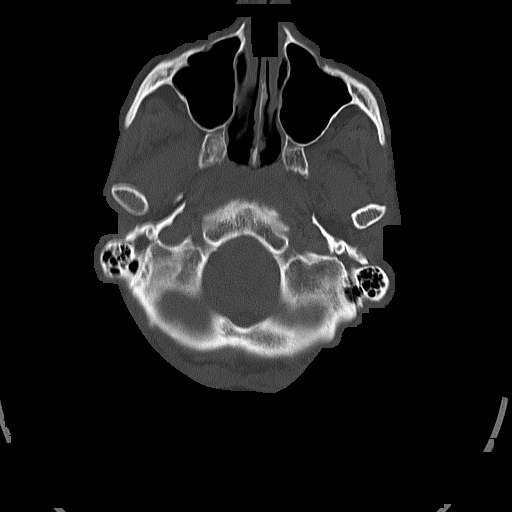
[im 12/80  brain]
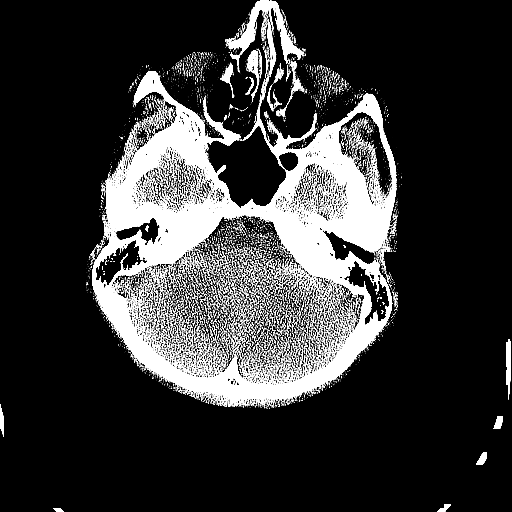
[im 20/80  brain]
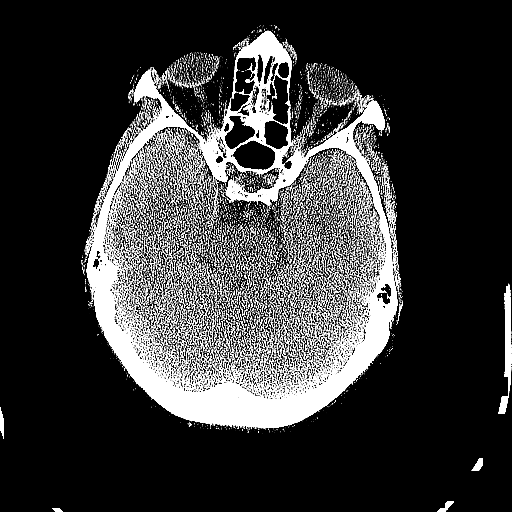
[im 28/80  brain]
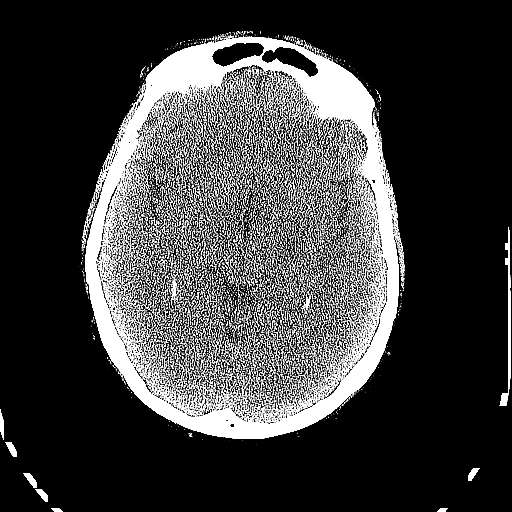
[im 36/80  brain]
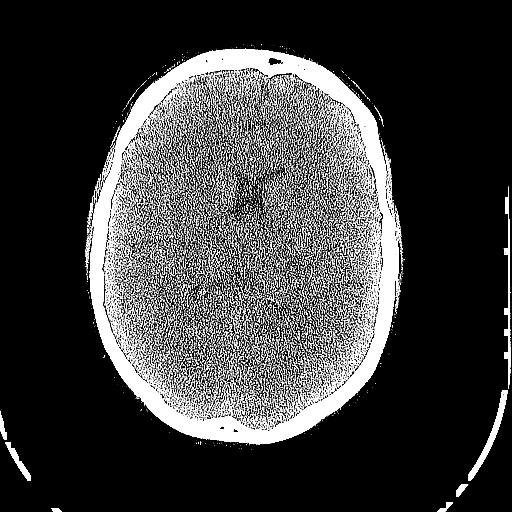
[im 36/80  bone]
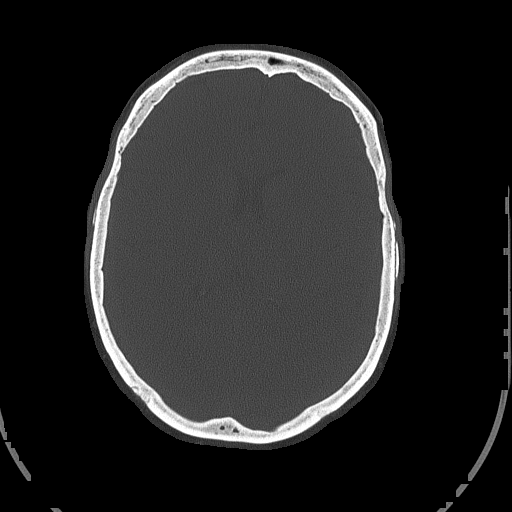
[im 44/80  brain]
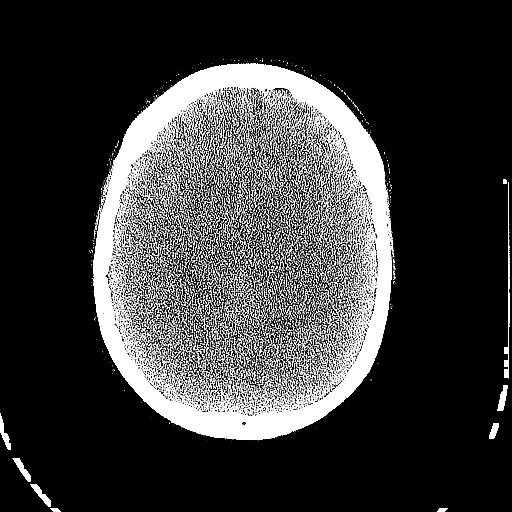
[im 52/80  brain]
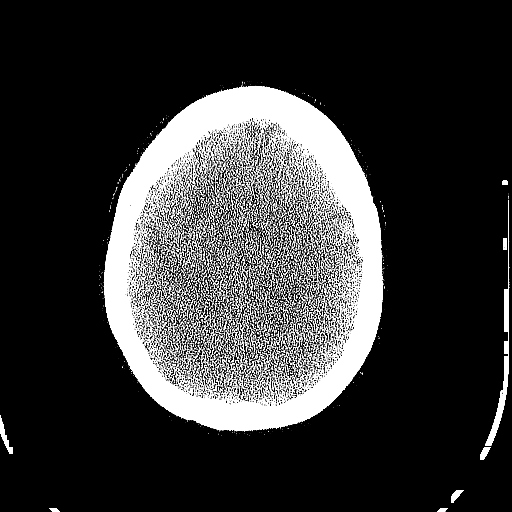
[im 60/80  brain]
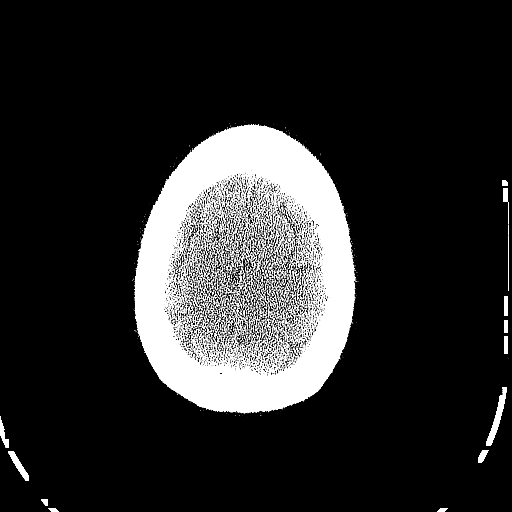
[im 68/80  brain]
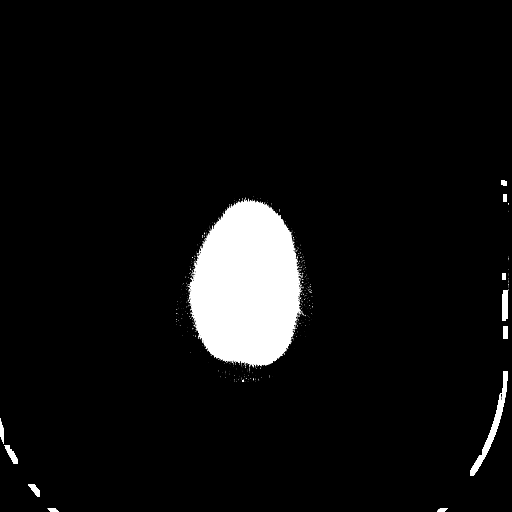
[im 68/80  bone]
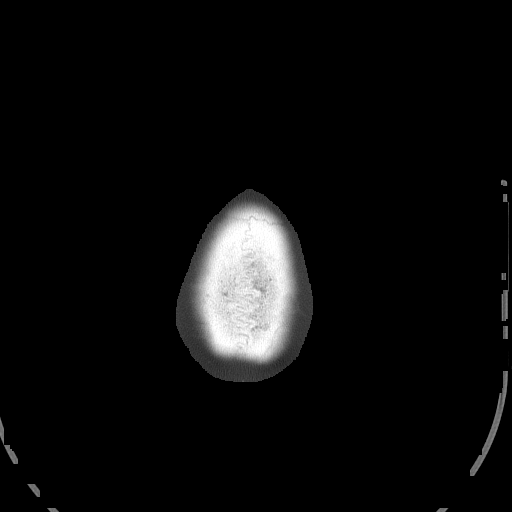
[im 76/80  brain]
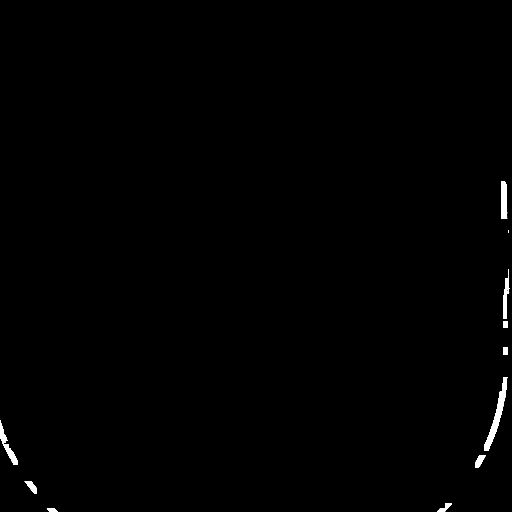

[Series 5: head 3.0 mpr cor · coronal · 0.31mm/px · 3 of 67 slices shown]
[im 23/67  brain]
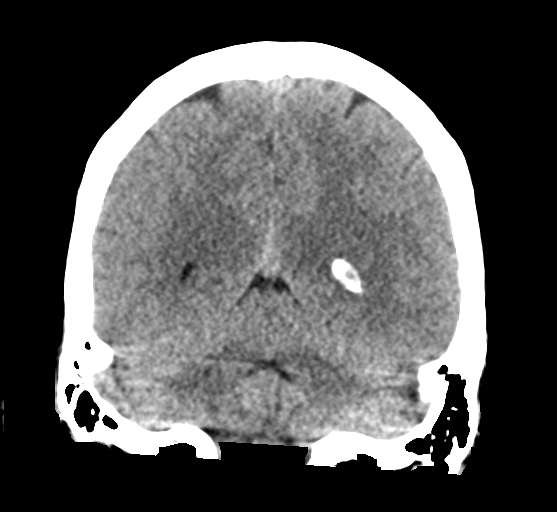
[im 30/67  brain]
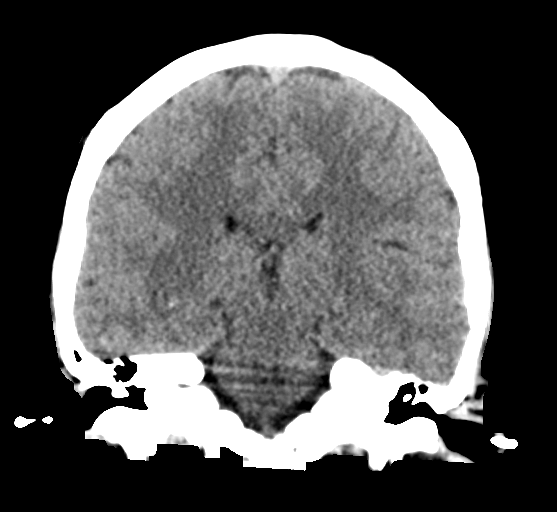
[im 37/67  brain]
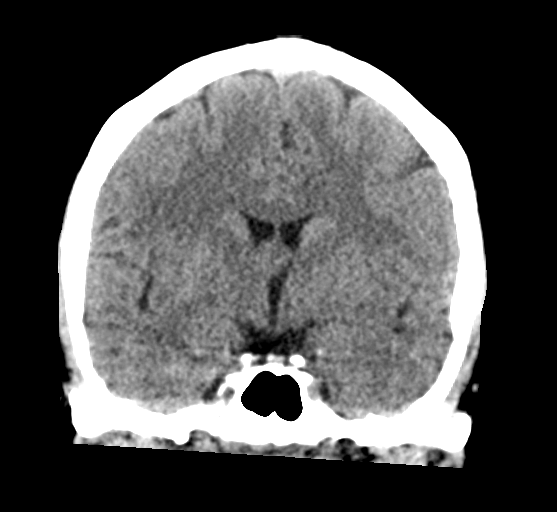

[Series 6: head 3.0 mpr sag · sagittal · 0.31mm/px · 3 of 53 slices shown]
[im 18/53  brain]
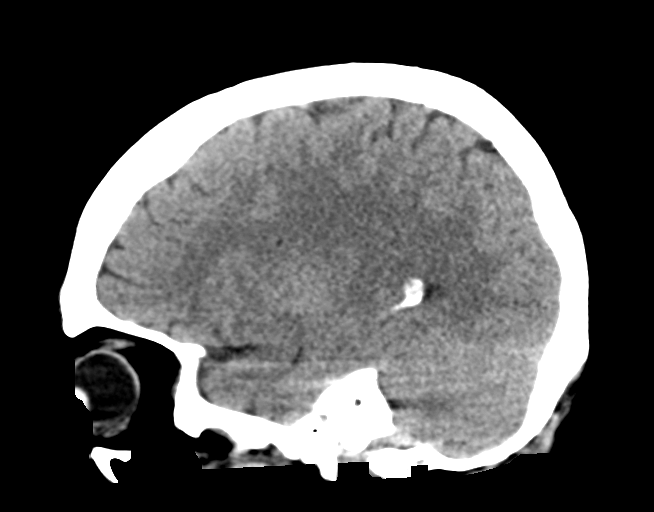
[im 27/53  brain]
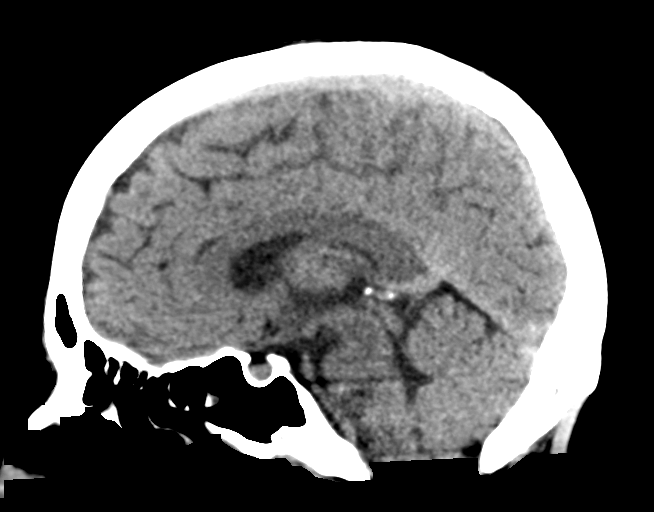
[im 35/53  brain]
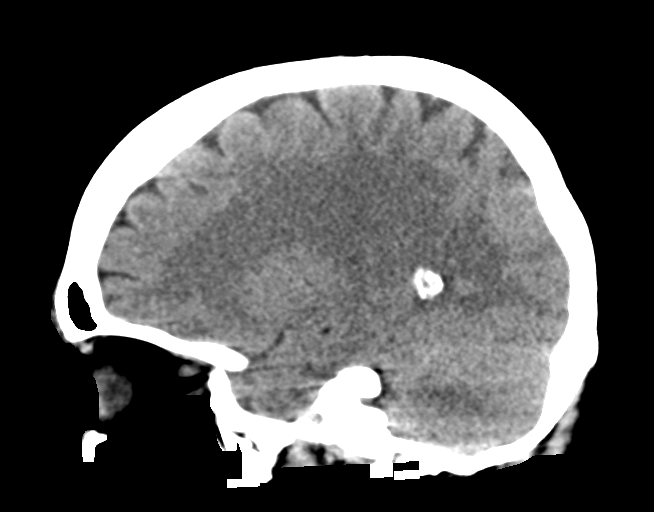

[16 of 47 positions shown; findings below may reference images not displayed]

FINDINGS: Brain: No evidence of acute infarction, hemorrhage, hydrocephalus,
extra-axial collection or mass lesion/mass effect.

Vascular: No hyperdense vessel or unexpected calcification.

Skull: Normal. Negative for fracture or focal lesion.

Sinuses/Orbits: No acute finding.

Other: None.
IMPRESSION: No acute intracranial abnormality seen.

## 2023-11-06 ENCOUNTER — Other Ambulatory Visit (HOSPITAL_COMMUNITY): Payer: Self-pay | Admitting: Physician Assistant

## 2023-11-06 DIAGNOSIS — F410 Panic disorder [episodic paroxysmal anxiety] without agoraphobia: Secondary | ICD-10-CM

## 2023-11-06 DIAGNOSIS — F411 Generalized anxiety disorder: Secondary | ICD-10-CM

## 2023-11-06 DIAGNOSIS — F4010 Social phobia, unspecified: Secondary | ICD-10-CM

## 2023-12-05 ENCOUNTER — Other Ambulatory Visit (HOSPITAL_COMMUNITY): Payer: Self-pay | Admitting: Physician Assistant

## 2023-12-05 DIAGNOSIS — F410 Panic disorder [episodic paroxysmal anxiety] without agoraphobia: Secondary | ICD-10-CM

## 2023-12-05 DIAGNOSIS — F4010 Social phobia, unspecified: Secondary | ICD-10-CM

## 2023-12-05 DIAGNOSIS — F411 Generalized anxiety disorder: Secondary | ICD-10-CM

## 2024-01-03 ENCOUNTER — Other Ambulatory Visit (HOSPITAL_COMMUNITY): Payer: Self-pay | Admitting: Physician Assistant

## 2024-01-03 DIAGNOSIS — F3341 Major depressive disorder, recurrent, in partial remission: Secondary | ICD-10-CM

## 2024-01-03 DIAGNOSIS — F410 Panic disorder [episodic paroxysmal anxiety] without agoraphobia: Secondary | ICD-10-CM

## 2024-01-03 DIAGNOSIS — F4010 Social phobia, unspecified: Secondary | ICD-10-CM

## 2024-01-03 DIAGNOSIS — F411 Generalized anxiety disorder: Secondary | ICD-10-CM

## 2024-01-05 ENCOUNTER — Other Ambulatory Visit (HOSPITAL_COMMUNITY): Payer: Self-pay | Admitting: Physician Assistant

## 2024-01-05 DIAGNOSIS — F411 Generalized anxiety disorder: Secondary | ICD-10-CM

## 2024-01-08 ENCOUNTER — Encounter (HOSPITAL_COMMUNITY): Payer: Self-pay | Admitting: Physician Assistant

## 2024-01-08 ENCOUNTER — Telehealth (INDEPENDENT_AMBULATORY_CARE_PROVIDER_SITE_OTHER): Payer: Medicare HMO | Admitting: Physician Assistant

## 2024-01-08 DIAGNOSIS — F411 Generalized anxiety disorder: Secondary | ICD-10-CM

## 2024-01-08 DIAGNOSIS — F410 Panic disorder [episodic paroxysmal anxiety] without agoraphobia: Secondary | ICD-10-CM

## 2024-01-08 DIAGNOSIS — Z79899 Other long term (current) drug therapy: Secondary | ICD-10-CM | POA: Diagnosis not present

## 2024-01-08 DIAGNOSIS — F3341 Major depressive disorder, recurrent, in partial remission: Secondary | ICD-10-CM | POA: Diagnosis not present

## 2024-01-08 DIAGNOSIS — F4001 Agoraphobia with panic disorder: Secondary | ICD-10-CM | POA: Diagnosis not present

## 2024-01-08 DIAGNOSIS — F4010 Social phobia, unspecified: Secondary | ICD-10-CM

## 2024-01-08 MED ORDER — ARIPIPRAZOLE 5 MG PO TABS: 5.0000 mg | ORAL_TABLET | Freq: Every day | ORAL | 0 refills | Status: DC

## 2024-01-08 MED ORDER — TRAZODONE HCL 50 MG PO TABS: 50.0000 mg | ORAL_TABLET | Freq: Every evening | ORAL | 2 refills | Status: DC | PRN

## 2024-01-08 MED ORDER — GABAPENTIN 300 MG PO CAPS: 300.0000 mg | ORAL_CAPSULE | Freq: Three times a day (TID) | ORAL | 2 refills | Status: DC

## 2024-01-08 MED ORDER — CLONAZEPAM 0.5 MG PO TABS: 0.5000 mg | ORAL_TABLET | Freq: Two times a day (BID) | ORAL | 0 refills | Status: DC

## 2024-01-08 NOTE — Progress Notes (Signed)
BH MD/PA/NP OP Progress Note  Virtual Visit via Video Note  I connected with [name] on 01/08/24 at  3:00 PM EST by a video enabled telemedicine application and verified that I am speaking with the correct person using two identifiers.  Location: Patient: Home Provider: Clinic   I discussed the limitations of evaluation and management by telemedicine and the availability of in person appointments. The patient expressed understanding and agreed to proceed.  Follow Up Instructions:   I discussed the assessment and treatment plan with the patient. The patient was provided an opportunity to ask questions and all were answered. The patient agreed with the plan and demonstrated an understanding of the instructions.   The patient was advised to call back or seek an in-person evaluation if the symptoms worsen or if the condition fails to improve as anticipated.  I provided 14 minutes of non-face-to-face time during this encounter.  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  Patient is currently being managed on the following psychiatric medications:  Abilify 5 mg daily Klonopin 0.5 mg 2 times daily as needed Neurontin 300 mg 3 times daily Trazodone 50 mg at bedtime  Patient reports that she is taking her medications regularly.  Patient denies experiencing any adverse side effects at this time.  Provider informed patient that she would have to be weaned off her Klonopin due to benzodiazepines being a  short-term medication.  Patient was also informed of the side effects associated with long-term use of benzodiazepines.  Patient reports that she has been on benzodiazepines for roughly 20 years.  She reports that she has a past history of Xanax or Klonopin use.  In regards to antidepressants, patient reports that she only remembers being on Cymbalta in the past.  Patient denies overt depressive symptoms at this time.  She endorses anxiety and states that her anxiety varies from day to day.  In regards to stressors, patient endorses a few stressors but does not go into detail.  She reports that her use of Klonopin has been effective in managing her anxiety.  When taking the medication to help with her anxiety, she reports that the medication goes into effect roughly 15 minutes after taking the medication.  A GAD-7 screen was performed with the patient scoring an 8.  Patient is alert and oriented x 4, calm, cooperative, and fully engaged in conversation during the encounter.  Patient endorses good mood.  Patient denies suicidal or homicidal ideations.  She further denies auditory or visual hallucinations and does not appear to be responding to internal/external stimuli.  Patient endorses fair sleep and receives on average 5 to 8 hours of sleep per night.  Patient endorses good appetite and eats on average 3 meals per day.  Patient denies alcohol consumption or illicit drug use.  Patient denies tobacco use but does engage in vaping.  Visit Diagnosis:    ICD-10-CM   1. Long-term current use of benzodiazepine  Z79.899 Urine Drug Panel 7    Urine Drug Panel 7  2. Long term current use of antipsychotic medication  Z79.899 Lipid panel    CBC with Differential    Comprehensive Metabolic Panel (CMET)    HgB A1c    CBC with Differential    HgB A1c    Comprehensive Metabolic Panel (CMET)    Lipid panel    3. GAD (generalized anxiety disorder)  F41.1 clonazePAM (KLONOPIN) 0.5 MG tablet    gabapentin  (NEURONTIN) 300 MG capsule    4. Social anxiety disorder  F40.10 clonazePAM (KLONOPIN) 0.5 MG tablet    5. Panic disorder  F41.0 clonazePAM (KLONOPIN) 0.5 MG tablet    6. MDD (major depressive disorder), recurrent, in partial remission (HCC)  F33.41 ARIPiprazole (ABILIFY) 5 MG tablet    traZODone (DESYREL) 50 MG tablet      Past Psychiatric History:  Major depressive disorder (recurrent, in partial remission) Generalized anxiety disorder Social anxiety disorder Panic disorder  Past Medical History:  Past Medical History:  Diagnosis Date   ADHD (attention deficit hyperactivity disorder)    Anxiety    Depression    Hx of staphylococcal septicemia     Past Surgical History:  Procedure Laterality Date   ABDOMINAL HYSTERECTOMY     BACK SURGERY     left elbow surgery     VIDEO BRONCHOSCOPY N/A 01/27/2013   Procedure: VIDEO BRONCHOSCOPY WITH FLUORO;  Surgeon: Storm Frisk, MD;  Location: [facility];  Service: Cardiopulmonary;  Laterality: N/A;    Family Psychiatric History:  Patient believes that her mother may have had a psychiatric history but she does not know the specifics.   Family History: History reviewed. No pertinent family history.  Social History:  Social History   Socioeconomic History   Marital status: Widowed    Spouse name: Not on file   Number of children: Not on file   Years of education: Not on file   Highest education level: Not on file  Occupational History   Not on file  Tobacco Use   Smoking status: Former    Current packs/day: 1.00    Average packs/day: 1 pack/day for 20.0 years (20.0 ttl pk-yrs)    Types: Cigarettes   Smokeless tobacco: Never  Vaping Use   Vaping status: Never Used  Substance and Sexual Activity   Alcohol use: Not Currently   Drug use: Not Currently    Types: Marijuana   Sexual activity: Not on file  Other Topics Concern   Not on file  Social History Narrative   Not on file   Social Drivers of Health    Financial Resource Strain: Not on file  Food Insecurity: Patient Declined (09/05/2022)   Hunger Vital Sign    Worried About Running Out of Food in the Last Year: Patient declined    Ran Out of Food in the Last Year: Patient declined  Transportation Needs: Patient Declined (09/05/2022)   PRAPARE - Administrator, Civil Service (Medical): Patient declined    Lack of Transportation (Non-Medical): Patient declined  Physical Activity: Not on file  Stress: Not on file  Social Connections: Unknown (04/27/2022)   Received from [facility]   Social Network    Social Network: Not on file    Allergies:  Allergies  Allergen Reactions   Penicillins Other (See Comments)    Childhood reaction- exact reaction not cited    Metabolic Disorder Labs: Lab Results  Component Value Date   HGBA1C 5.0 09/02/2022   MPG 96.8 09/02/2022   MPG 96.8  08/08/2022   Lab Results  Component Value Date   PROLACTIN 19.2 11/13/2020   Lab Results  Component Value Date   CHOL 180 09/02/2022   TRIG 38 09/02/2022   HDL 68 09/02/2022   CHOLHDL 2.6 09/02/2022   VLDL 8 09/02/2022   LDLCALC 104 (H) 09/02/2022   LDLCALC 133 (H) 08/08/2022   Lab Results  Component Value Date   TSH 1.564 08/31/2022   TSH 1.455 08/08/2022    Therapeutic Level Labs: No results found for: "LITHIUM" No results found for: "VALPROATE" No results found for: "CBMZ"  Current Medications: Current Outpatient Medications  Medication Sig Dispense Refill   ARIPiprazole (ABILIFY) 5 MG tablet Take 1 tablet (5 mg total) by mouth daily. 90 tablet 0   clonazePAM (KLONOPIN) 0.5 MG tablet Take 1 tablet (0.5 mg total) by mouth 2 (two) times daily. 60 tablet 0   gabapentin (NEURONTIN) 300 MG capsule Take 1 capsule (300 mg total) by mouth 3 (three) times daily. 90 capsule 2   hydrOXYzine (ATARAX) 25 MG tablet Take 1 tablet (25 mg total) by mouth 3 (three) times daily as needed for anxiety. 75 tablet 1   traZODone  (DESYREL) 50 MG tablet Take 1 tablet (50 mg total) by mouth at bedtime as needed for sleep. 30 tablet 2   No current facility-administered medications for this visit.     Musculoskeletal: Strength & Muscle Tone: within normal limits Gait & Station: normal Patient leans: N/A  Psychiatric Specialty Exam: Review of Systems  Psychiatric/Behavioral:  Negative for decreased concentration, dysphoric mood, hallucinations, self-injury, sleep disturbance and suicidal ideas. The patient is nervous/anxious. The patient is not hyperactive.     There were no vitals taken for this visit.There is no height or weight on file to calculate BMI.  General Appearance: Casual  Eye Contact:  Good  Speech:  Clear and Coherent and Normal Rate  Volume:  Normal  Mood:  Anxious  Affect:  Appropriate  Thought Process:  Coherent and Descriptions of Associations: Intact  Orientation:  Full (Time, Place, and Person)  Thought Content: WDL   Suicidal Thoughts:  No  Homicidal Thoughts:  No  Memory:  Immediate;   Fair Recent;   Fair Remote;   Fair  Judgement:  Fair  Insight:  Fair  Psychomotor Activity:  Normal  Concentration:  Concentration: Good and Attention Span: Good  Recall:  Good  Fund of Knowledge: Good  Language: Good  Akathisia:  No  Handed:  Right  AIMS (if indicated): not done  Assets:  Communication Skills Desire for Improvement Housing Social Support  ADL's:  Intact  Cognition: WNL  Sleep:  Good   Screenings: AIMS    Flowsheet Row [facility]  AIMS Total Score 0 0      AUDIT    Flowsheet Row [facility]) [facility]-Admission (Discharged) [facility] Ad[facility] ([facility]) [facility]  Alcohol Use Disorder Identification Test Final Score (AUDIT) 1 1 0      GAD-7    Flowsheet Row Video Visit from 01/08/2024 in [facility] Video Visit from 10/09/2023 in [facility] Video Visit from 07/10/2023 in [facility] Video Visit from 05/02/2023 in [facility]  Video Visit from 03/21/2023 in [facility]  Total GAD-7 Score 8 10 8 12 6       PHQ2-9    Flowsheet Row Video Visit from 01/08/2024 in [facility] Video Visit from 10/09/2023 in [facility] Video Visit from 07/10/2023 in [facility] Video Visit from 05/02/2023 in [facility] Video Visit from 03/21/2023 in [facility]  PHQ-2 Total Score 0 1 0 0 2  PHQ-9 Total Score -- -- -- 3 9      Flowsheet Row Video Visit from 01/08/2024 in [facility] Video Visit from 10/09/2023 in [facility] Video Visit from 07/10/2023 in [facility]  C-SSRS RISK CATEGORY Moderate Risk Error: Q6 is Yes, you must answer 7 Low Risk        Assessment and Plan:   [name] is a 53 year old, Caucasian female with a past psychiatric history significant for 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  Patient presents to the encounter stating that she continues to take her medications regularly.  She denies experiencing any adverse side effects from her use of her current medication regimen.  Patient was informed that she would be weaned off  her clonazepam.  Provider informed patient that adverse side effects were associated with long-term use of benzodiazepines.  Patient reports that she has a history of being on either Xanax or clonazepam and states that she has been on these medications for roughly 20 years.  Provider informed patient that her anxiety could be managed through the use of antidepressants.  Patient vocalized understanding.  Due to her use of clonazepam, provider informed patient that a urine drug screen would need to be obtained to make sure that patient is taking her medication appropriately.  Patient verbalized understanding.  Provider informed patient of the following labs would need to be obtained due to her use of Abilify: Lipid profile, complete metabolic panel, complete blood count with differential, and hemoglobin A1c.  Patient vocalized understanding.  Patient endorses some anxiety but states that her use of clonazepam has been effective in managing it.  Patient denies overt depressive symptoms.  Patient to continue taking her medications as prescribed.  Patient's medications to be e-prescribed to pharmacy of choice.  Collaboration of Care: Collaboration of Care: Medication Management AEB provider managing patient's psychiatric medications, Primary Care Provider AEB patient being followed by a primary care provider, and Psychiatrist AEB patient being followed by mental health provider at this facility  Patient/Guardian was advised Release of Information must be obtained prior to any record release in order to collaborate their care with an outside provider. Patient/Guardian was advised if they have not already done so to contact the registration department to sign all necessary forms in order for [place] to release information regarding their care.   Consent: Patient/Guardian gives verbal consent for treatment and assignment of benefits for services provided during this visit. Patient/Guardian expressed understanding and  agreed to proceed.   1. Long-term current use of benzodiazepine (Primary)  - Urine Drug Panel 7; Future - Urine Drug Panel 7  2. Long term current use of antipsychotic medication  - Lipid panel; Future - CBC with Differential; Future - Comprehensive Metabolic Panel (CMET); Future - HgB  A1c; Future - CBC with Differential - HgB A1c - Comprehensive Metabolic Panel (CMET) - Lipid panel  3. GAD (generalized anxiety disorder)  - clonazePAM (KLONOPIN) 0.5 MG tablet; Take 1 tablet (0.5 mg total) by mouth 2 (two) times daily.  Dispense: 60 tablet; Refill: 0 - gabapentin (NEURONTIN) 300 MG capsule; Take 1 capsule (300 mg total) by mouth 3 (three) times daily.  Dispense: 90 capsule; Refill: 2  4. Social anxiety disorder  - clonazePAM (KLONOPIN) 0.5 MG tablet; Take 1 tablet (0.5 mg total) by mouth 2 (two) times daily.  Dispense: 60 tablet; Refill: 0  5. Panic disorder  - clonazePAM (KLONOPIN) 0.5 MG tablet; Take 1 tablet (0.5 mg total) by mouth 2 (two) times daily.  Dispense: 60 tablet; Refill: 0  6. MDD (major depressive disorder), recurrent, in partial remission (HCC)  - ARIPiprazole (ABILIFY) 5 MG tablet; Take 1 tablet (5 mg total) by mouth daily.  Dispense: 90 tablet; Refill: 0 - traZODone (DESYREL) 50 MG tablet; Take 1 tablet (50 mg total) by mouth at bedtime as needed for sleep.  Dispense: 30 tablet; Refill: 2  Patient to follow-up within 3 months Provider spent a total of 14 minutes with the patient/reviewing patient's chart  Meta Hatchet, PA 01/08/2024, 6:57 PM

## 2024-02-01 ENCOUNTER — Other Ambulatory Visit (HOSPITAL_COMMUNITY): Payer: Self-pay | Admitting: Physician Assistant

## 2024-02-01 DIAGNOSIS — F3341 Major depressive disorder, recurrent, in partial remission: Secondary | ICD-10-CM

## 2024-02-05 ENCOUNTER — Other Ambulatory Visit (HOSPITAL_COMMUNITY): Payer: Self-pay | Admitting: Physician Assistant

## 2024-02-05 DIAGNOSIS — F411 Generalized anxiety disorder: Secondary | ICD-10-CM

## 2024-02-05 DIAGNOSIS — F410 Panic disorder [episodic paroxysmal anxiety] without agoraphobia: Secondary | ICD-10-CM

## 2024-02-05 DIAGNOSIS — F4010 Social phobia, unspecified: Secondary | ICD-10-CM

## 2024-02-07 ENCOUNTER — Telehealth (HOSPITAL_COMMUNITY): Payer: Self-pay

## 2024-02-07 NOTE — Telephone Encounter (Signed)
 Hello,   Pt called stating she needs a refill of clonazePAM (KLONOPIN) 0.5 MG tablet . Last seen 2/20 next app in April with provider.

## 2024-02-11 ENCOUNTER — Telehealth (HOSPITAL_COMMUNITY): Payer: Self-pay

## 2024-02-11 NOTE — Telephone Encounter (Signed)
 Hello,    Pt is requesting for her clonazePAM (KLONOPIN) 0.5 MG tablet  to be refilled. Last seen 1/22 next app 4/12.

## 2024-02-12 NOTE — Telephone Encounter (Signed)
 Message acknowledged and reviewed. Medication was e-prescribed to pharmacy of choice.

## 2024-02-12 NOTE — Telephone Encounter (Signed)
 Message acknowledge and reviewed. Medication e-prescribed to pharmacy of choice.

## 2024-03-08 ENCOUNTER — Other Ambulatory Visit (HOSPITAL_COMMUNITY): Payer: Self-pay | Admitting: Physician Assistant

## 2024-03-08 DIAGNOSIS — F411 Generalized anxiety disorder: Secondary | ICD-10-CM

## 2024-03-08 DIAGNOSIS — F410 Panic disorder [episodic paroxysmal anxiety] without agoraphobia: Secondary | ICD-10-CM

## 2024-03-08 DIAGNOSIS — F4010 Social phobia, unspecified: Secondary | ICD-10-CM

## 2024-04-08 ENCOUNTER — Telehealth (HOSPITAL_COMMUNITY): Payer: Medicare HMO | Admitting: Physician Assistant

## 2024-04-08 ENCOUNTER — Encounter (HOSPITAL_COMMUNITY): Payer: Self-pay | Admitting: Physician Assistant

## 2024-04-08 DIAGNOSIS — F410 Panic disorder [episodic paroxysmal anxiety] without agoraphobia: Secondary | ICD-10-CM | POA: Diagnosis not present

## 2024-04-08 DIAGNOSIS — Z79899 Other long term (current) drug therapy: Secondary | ICD-10-CM

## 2024-04-08 DIAGNOSIS — F3341 Major depressive disorder, recurrent, in partial remission: Secondary | ICD-10-CM | POA: Diagnosis not present

## 2024-04-08 DIAGNOSIS — F411 Generalized anxiety disorder: Secondary | ICD-10-CM

## 2024-04-08 DIAGNOSIS — F4010 Social phobia, unspecified: Secondary | ICD-10-CM | POA: Diagnosis not present

## 2024-04-08 MED ORDER — GABAPENTIN 300 MG PO CAPS: 300.0000 mg | ORAL_CAPSULE | Freq: Three times a day (TID) | ORAL | 2 refills | Status: DC

## 2024-04-08 MED ORDER — ARIPIPRAZOLE 5 MG PO TABS: 5.0000 mg | ORAL_TABLET | Freq: Every day | ORAL | 0 refills | Status: DC

## 2024-04-08 MED ORDER — TRAZODONE HCL 50 MG PO TABS: 50.0000 mg | ORAL_TABLET | Freq: Every evening | ORAL | 0 refills | Status: DC | PRN

## 2024-04-08 MED ORDER — HYDROXYZINE HCL 25 MG PO TABS: 25.0000 mg | ORAL_TABLET | Freq: Three times a day (TID) | ORAL | 1 refills | Status: AC | PRN

## 2024-04-08 NOTE — Progress Notes (Signed)
 BH MD/PA/NP OP Progress Note  Virtual Visit via Video Note  I connected with [name] on 04/08/24 at  3:00 PM EDT by a video enabled telemedicine application and verified that I am speaking with the correct person using two identifiers.  Location: Patient: Home Provider: Clinic   I discussed the limitations of evaluation and management by telemedicine and the availability of in person appointments. The patient expressed understanding and agreed to proceed.  Follow Up Instructions:   I discussed the assessment and treatment plan with the patient. The patient was provided an opportunity to ask questions and all were answered. The patient agreed with the plan and demonstrated an understanding of the instructions.   The patient was advised to call back or seek an in-person evaluation if the symptoms worsen or if the condition fails to improve as anticipated.  I provided 10 minutes of non-face-to-face time during this encounter.  Gates Kasal, PA    04/08/2024 7:42 PM [name]  MRN:  [id number]  Chief Complaint:  Chief Complaint  Patient presents with   Follow-up   Medication Refill   HPI:   [name] is a 53 year old, Caucasian female with a past psychiatric history significant for major depressive disorder (in partial remission), generalized anxiety disorder, social anxiety disorder, and panic disorder who presents to [facility] via virtual video visit for follow-up and medication management.  Patient is currently being managed on the following psychiatric medications:  Abilify  5 mg daily Klonopin  0.5 mg 2 times daily as needed Neurontin  300 mg 3 times daily Trazodone  50 mg at bedtime  Patient presents to the encounter stating that she continues to take her medications regularly.  Patient denies experiencing any adverse side effects and further denies the need for dosage adjustments at this time.  Patient denies  experiencing any rigidity, stiffness, or involuntary movements from the use of her Abilify .  In regards to her use of clonazepam , patient reports that she continues to take the medication as needed.  She reports that when she takes her medication kicks in within 10 to 15 minutes with the use.  Patient reports that the therapeutic effects of the medication may last hours.  She denies experiencing any adverse side effects from her use of clonazepam .  Patient denies overt depressive symptoms but states that she has been experiencing anxiety due to personal issues and occupational stressors.  Patient rates her anxiety at 1 out of 10.  Patient denies any other stressors at this time.  A GAD-7 screen was performed with the patient scoring a 10.  Patient is alert and oriented x 4, calm, cooperative, and fully engaged in conversation during the encounter.  Patient endorses fine mood but states that she has a lot on her mind causing her fatigue.  Patient exhibits euthymic mood with appropriate affect.  Patient denies suicidal or homicidal ideations.  She further denies auditory or visual hallucinations and does not appear to be responding to internal/external stimuli.  Patient endorses good sleep and receives on average 8 to 9 hours of sleep per night.  Patient endorses good appetite needs on average 2 meals per day.  Patient denies alcohol consumption, tobacco use, or illicit drug use.  Visit Diagnosis:    ICD-10-CM   1. GAD (generalized anxiety disorder)  F41.1 gabapentin  (NEURONTIN ) 300 MG capsule    2. MDD (major depressive disorder), recurrent, in partial remission (HCC)  F33.41 ARIPiprazole  (ABILIFY ) 5 MG tablet  hydrOXYzine  (ATARAX ) 25 MG tablet    traZODone  (DESYREL ) 50 MG tablet      Past Psychiatric History:  Major depressive disorder (recurrent, in partial remission) Generalized anxiety disorder Social anxiety disorder Panic disorder  Past Medical History:  Past Medical History:   Diagnosis Date   ADHD (attention deficit hyperactivity disorder)    Anxiety    Depression    Hx of staphylococcal septicemia     Past Surgical History:  Procedure Laterality Date   ABDOMINAL HYSTERECTOMY     BACK SURGERY     left elbow surgery     VIDEO BRONCHOSCOPY N/A 01/27/2013   Procedure: VIDEO BRONCHOSCOPY WITH FLUORO;  Surgeon: Vernell Goldsmith, MD;  Location: [facility];  Service: Cardiopulmonary;  Laterality: N/A;    Family Psychiatric History:  Patient believes that her mother may have had a psychiatric history but she does not know the specifics.   Family History: History reviewed. No pertinent family history.  Social History:  Social History   Socioeconomic History   Marital status: Widowed    Spouse name: Not on file   Number of children: Not on file   Years of education: Not on file   Highest education level: Not on file  Occupational History   Not on file  Tobacco Use   Smoking status: Former    Current packs/day: 1.00    Average packs/day: 1 pack/day for 20.0 years (20.0 ttl pk-yrs)    Types: Cigarettes   Smokeless tobacco: Never  Vaping Use   Vaping status: Never Used  Substance and Sexual Activity   Alcohol use: Not Currently   Drug use: Not Currently    Types: Marijuana   Sexual activity: Not on file  Other Topics Concern   Not on file  Social History Narrative   Not on file   Social Drivers of Health   Financial Resource Strain: Not on file  Food Insecurity: Patient Declined (09/05/2022)   Hunger Vital Sign    Worried About Running Out of Food in the Last Year: Patient declined    Ran Out of Food in the Last Year: Patient declined  Transportation Needs: Patient Declined (09/05/2022)   PRAPARE - Administrator, Civil Service (Medical): Patient declined    Lack of Transportation (Non-Medical): Patient declined  Physical Activity: Not on file  Stress: Not on file  Social Connections: Unknown (04/27/2022)   Received from [facility]   Social Network    Social Network: Not on file    Allergies:  Allergies  Allergen Reactions   Penicillins Other (See Comments)    Childhood reaction- exact reaction not cited    Metabolic Disorder Labs: Lab Results  Component Value Date   HGBA1C 5.0 09/02/2022   MPG 96.8 09/02/2022   MPG 96.8 08/08/2022   Lab Results  Component Value Date   PROLACTIN 19.2 11/13/2020   Lab Results  Component Value Date   CHOL 180 09/02/2022   TRIG 38 09/02/2022   HDL 68 09/02/2022   CHOLHDL 2.6 09/02/2022   VLDL 8 09/02/2022   LDLCALC 104 (H) 09/02/2022   LDLCALC 133 (H) 08/08/2022   Lab Results  Component Value Date   TSH 1.564 08/31/2022   TSH 1.455 08/08/2022    Therapeutic Level Labs: No results found for: "LITHIUM" No results found for: "VALPROATE" No results found for: "CBMZ"  Current Medications: Current Outpatient Medications  Medication Sig Dispense Refill   ARIPiprazole  (ABILIFY ) 5 MG tablet Take 1  tablet (5 mg total) by mouth daily. 90 tablet 0   clonazePAM  (KLONOPIN ) 0.5 MG tablet TAKE 1 TABLET BY MOUTH TWICE A DAY 60 tablet 0   gabapentin  (NEURONTIN ) 300 MG capsule Take 1 capsule (300 mg total) by mouth 3 (three) times daily. 90 capsule 2   hydrOXYzine  (ATARAX ) 25 MG tablet Take 1 tablet (25 mg total) by mouth 3 (three) times daily as needed for anxiety. 75 tablet 1   traZODone  (DESYREL ) 50 MG tablet Take 1 tablet (50 mg total) by mouth at bedtime as needed. for sleep 90 tablet 0   No current facility-administered medications for this visit.     Musculoskeletal: Strength & Muscle Tone: within normal limits Gait & Station: normal Patient leans: N/A  Psychiatric Specialty Exam: Review of Systems  Psychiatric/Behavioral:  Negative for decreased concentration, dysphoric mood, hallucinations, self-injury, sleep disturbance and suicidal ideas. The patient is nervous/anxious. The patient is not hyperactive.     There were no vitals taken  for this visit.There is no height or weight on file to calculate BMI.  General Appearance: Casual  Eye Contact:  Good  Speech:  Clear and Coherent and Normal Rate  Volume:  Normal  Mood:  Anxious  Affect:  Appropriate  Thought Process:  Coherent and Descriptions of Associations: Intact  Orientation:  Full (Time, Place, and Person)  Thought Content: WDL   Suicidal Thoughts:  No  Homicidal Thoughts:  No  Memory:  Immediate;   Fair Recent;   Fair Remote;   Fair  Judgement:  Fair  Insight:  Fair  Psychomotor Activity:  Normal  Concentration:  Concentration: Good and Attention Span: Good  Recall:  Good  Fund of Knowledge: Good  Language: Good  Akathisia:  No  Handed:  Right  AIMS (if indicated): not done  Assets:  Communication Skills Desire for Improvement Housing Social Support  ADL's:  Intact  Cognition: WNL  Sleep:  Good   Screenings: AIMS    Flowsheet Row [facility]) [facility]  AIMS Total Score 0 0      AUDIT    Flowsheet Row [facility]) [facility]-Ad[facility] (Discharged[facility] ([facility]) [facility]  Alcohol Use Disorder Identification Test Final Score (AUDIT) 1 1 0      GAD-7    Flowsheet Row Video Visit from 04/08/2024 in [facility] Video Visit from 01/08/2024 in [facility] Video Visit from 10/09/2023 in [facility] Video Visit from 07/10/2023 in [facility] Video Visit from 05/02/2023 in [facility]  Total GAD-7 Score 10 8 10 8 12       PHQ2-9    Flowsheet Row Video Visit  from 04/08/2024 in [facility] Video Visit from 01/08/2024 in [facility] Video Visit from 10/09/2023 in [facility] Video Visit from 07/10/2023 in [facility] Video Visit from 05/02/2023 in [facility]  PHQ-2 Total Score 0 0 1 0 0  PHQ-9 Total Score -- -- -- -- 3      Flowsheet Row Video Visit from 04/08/2024 in [facility] Video Visit from 01/08/2024 in [facility] Video Visit from 10/09/2023 in [facility]  C-SSRS RISK CATEGORY Moderate Risk Moderate Risk Error: Q6 is Yes, you must answer 7        Assessment and Plan:   [name] is a 53 year old, Caucasian female with a past psychiatric history significant for 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  Patient presents to the encounter reporting no issues or concerns regarding her current medication regimen.  Patient denies the need for dosage adjustments at this time.  Patient denies overt depressive symptoms but states that she has been experiencing anxiety due to personal issues and occupational stressors.  She reports that she still continues to use her clonazepam  responsibly and as needed.  She reports that she has not been experiencing stiffness, rigidity, or involuntary movements from the use of Abilify .  No involuntary movements were observed during the encounter.  A GAD-7 screen was performed with the patient scoring a 10.  Patient endorses stability on her current medication regimen and would like to continue taking her medications as prescribed.  A [place] Suicide Severity Rating Scale was  performed with the patient being considered at moderate risk.  Patient denies suicidal ideations and is able to contract for safety following the conclusion of the encounter.  Patient labs are still pending.  She informed provider that she would go to [facility] to have her labs and urine drug screen run.  PDMP was reviewed following the conclusion of the encounter.  Collaboration of Care: Collaboration of Care: Medication Management AEB provider managing patient's psychiatric medications, Primary Care Provider AEB patient being followed by a primary care provider, and Psychiatrist AEB patient being followed by mental health provider at this facility  Patient/Guardian was advised Release of Information must be obtained prior to any record release in order to collaborate their care with an outside provider. Patient/Guardian was advised if they have not already done so to contact the registration department to sign all necessary forms in order for us  to release information regarding their care.   Consent: Patient/Guardian gives verbal consent for treatment and assignment of benefits for services provided during this visit. Patient/Guardian expressed understanding and agreed to proceed.   1. GAD (generalized anxiety disorder)  - gabapentin  (NEURONTIN ) 300 MG capsule; Take 1 capsule (300 mg total) by mouth 3 (three) times daily.  Dispense: 90 capsule; Refill: 2  2. MDD (major depressive disorder), recurrent, in partial remission (HCC)  - ARIPiprazole  (ABILIFY ) 5 MG tablet; Take 1 tablet (5 mg total) by mouth daily.  Dispense: 90 tablet; Refill: 0 - hydrOXYzine  (ATARAX ) 25 MG tablet; Take 1 tablet (25 mg total) by mouth 3 (three) times daily as needed for anxiety.  Dispense: 75 tablet; Refill: 1 - traZODone  (DESYREL ) 50 MG tablet; Take 1 tablet (50 mg total) by mouth at bedtime as needed. for sleep  Dispense: 90 tablet; Refill: 0  3. Social anxiety disorder (Primary) Patient to continue taking  clonazepam  0.5 mg 2 times daily as needed for the management of her social anxiety disorder  4. Panic disorder Patient to continue taking clonazepam  0.5 mg 2 times daily as needed for the management of her panic disorder  5. Long-term current use of benzodiazepine Urine drug screen pending  6. Long term current use of antipsychotic medication Labs pending  Patient to follow-up within 3  months Provider spent a total of 10 minutes with the patient/reviewing patient's chart  Gates Kasal, PA 04/08/2024, 7:42 PM

## 2024-04-14 ENCOUNTER — Other Ambulatory Visit (HOSPITAL_COMMUNITY): Payer: Self-pay | Admitting: Physician Assistant

## 2024-04-14 DIAGNOSIS — F410 Panic disorder [episodic paroxysmal anxiety] without agoraphobia: Secondary | ICD-10-CM

## 2024-04-14 DIAGNOSIS — Z79899 Other long term (current) drug therapy: Secondary | ICD-10-CM | POA: Diagnosis not present

## 2024-04-14 DIAGNOSIS — F411 Generalized anxiety disorder: Secondary | ICD-10-CM

## 2024-04-14 DIAGNOSIS — F4010 Social phobia, unspecified: Secondary | ICD-10-CM

## 2024-04-15 LAB — CBC WITH DIFFERENTIAL/PLATELET
Basophils Absolute: 0 10*3/uL (ref 0.0–0.2)
Basos: 1 %
EOS (ABSOLUTE): 0 10*3/uL (ref 0.0–0.4)
Eos: 0 %
Hematocrit: 41.3 % (ref 34.0–46.6)
Hemoglobin: 13.9 g/dL (ref 11.1–15.9)
Immature Grans (Abs): 0 10*3/uL (ref 0.0–0.1)
Immature Granulocytes: 0 %
Lymphocytes Absolute: 1.7 10*3/uL (ref 0.7–3.1)
Lymphs: 31 %
MCH: 30.9 pg (ref 26.6–33.0)
MCHC: 33.7 g/dL (ref 31.5–35.7)
MCV: 92 fL (ref 79–97)
Monocytes Absolute: 0.4 10*3/uL (ref 0.1–0.9)
Monocytes: 7 %
Neutrophils Absolute: 3.5 10*3/uL (ref 1.4–7.0)
Neutrophils: 61 %
Platelets: 293 10*3/uL (ref 150–450)
RBC: 4.5 x10E6/uL (ref 3.77–5.28)
RDW: 12.4 % (ref 11.7–15.4)
WBC: 5.7 10*3/uL (ref 3.4–10.8)

## 2024-04-15 LAB — LIPID PANEL
Chol/HDL Ratio: 2.8 ratio (ref 0.0–4.4)
Cholesterol, Total: 223 mg/dL — ABNORMAL HIGH (ref 100–199)
HDL: 79 mg/dL (ref >39)
LDL Chol Calc (NIH): 131 mg/dL — ABNORMAL HIGH (ref 0–99)
Triglycerides: 75 mg/dL (ref 0–149)
VLDL Cholesterol Cal: 13 mg/dL (ref 5–40)

## 2024-04-15 LAB — COMPREHENSIVE METABOLIC PANEL WITH GFR
ALT: 15 IU/L (ref 0–32)
AST: 16 IU/L (ref 0–40)
Albumin: 4.4 g/dL (ref 3.8–4.9)
Alkaline Phosphatase: 90 IU/L (ref 44–121)
BUN/Creatinine Ratio: 12 (ref 9–23)
BUN: 10 mg/dL (ref 6–24)
Bilirubin Total: 0.4 mg/dL (ref 0.0–1.2)
CO2: 23 mmol/L (ref 20–29)
Calcium: 9.7 mg/dL (ref 8.7–10.2)
Chloride: 106 mmol/L (ref 96–106)
Creatinine, Ser: 0.81 mg/dL (ref 0.57–1.00)
Globulin, Total: 2.6 g/dL (ref 1.5–4.5)
Glucose: 98 mg/dL (ref 70–99)
Potassium: 4.3 mmol/L (ref 3.5–5.2)
Sodium: 143 mmol/L (ref 134–144)
Total Protein: 7 g/dL (ref 6.0–8.5)
eGFR: 88 mL/min/{1.73_m2} (ref >59)

## 2024-04-15 LAB — HEMOGLOBIN A1C
Est. average glucose Bld gHb Est-mCnc: 105 mg/dL
Hgb A1c MFr Bld: 5.3 % (ref 4.8–5.6)

## 2024-04-20 ENCOUNTER — Telehealth (HOSPITAL_COMMUNITY): Payer: Self-pay

## 2024-04-20 NOTE — Telephone Encounter (Signed)
 Pt wants to know if she can go to [facility] today get UDS for KLONOPIN .   If so can provider put in orders ?   Pt states that she had did her blood work there recently.

## 2024-04-21 DIAGNOSIS — Z79899 Other long term (current) drug therapy: Secondary | ICD-10-CM | POA: Diagnosis not present

## 2024-04-23 ENCOUNTER — Telehealth (HOSPITAL_COMMUNITY): Payer: Self-pay

## 2024-04-23 NOTE — Telephone Encounter (Signed)
 Hello,   Pt called again regarding UDS, states she has follow up questions and wouldn't go into much detail regarding this situation when asked, "states that Dr Berta Brittle will know".   JNL

## 2024-04-25 LAB — URINE DRUG PANEL 7
Amphetamines, Urine: NEGATIVE ng/mL
Barbiturate Quant, Ur: NEGATIVE ng/mL
Benzodiazepine Quant, Ur: NEGATIVE ng/mL
Cocaine (Metab.): NEGATIVE ng/mL
Creatinine, Urine: 119.3 mg/dL (ref 20.0–300.0)
Nitrite Urine, Quantitative: NEGATIVE ug/mL
OPIATE SCREEN URINE: NEGATIVE ng/mL
PCP Quant, Ur: NEGATIVE ng/mL
pH, Urine: 5.1 (ref 4.5–8.9)

## 2024-04-25 LAB — CANNABINOID CONFIRMATION, UR
CANNABINOIDS: POSITIVE — AB
Carboxy THC GC/MS Conf: 35 ng/mL

## 2024-04-28 NOTE — Telephone Encounter (Signed)
 Message acknowledged and reviewed. Provider was able to review patient's Urine Drug Screen. Patient's urine drug screen was negative for any illicit substances. It should be noted that clonazepam  is occasionally not detected when performing urine drug screens. Provider to refill patient's medication. PDMP was reviewed prior to clonazepam  refill.

## 2024-04-28 NOTE — Telephone Encounter (Signed)
 Message acknowledged and reviewed.

## 2024-04-29 NOTE — Telephone Encounter (Signed)
 Patient's medication was e-prescribed to pharmacy of choice. PDMP was reviewed prior to prescription refill.

## 2024-05-26 ENCOUNTER — Other Ambulatory Visit (HOSPITAL_COMMUNITY): Payer: Self-pay | Admitting: Physician Assistant

## 2024-05-26 DIAGNOSIS — F4010 Social phobia, unspecified: Secondary | ICD-10-CM

## 2024-05-26 DIAGNOSIS — F410 Panic disorder [episodic paroxysmal anxiety] without agoraphobia: Secondary | ICD-10-CM

## 2024-05-26 DIAGNOSIS — F411 Generalized anxiety disorder: Secondary | ICD-10-CM

## 2024-06-05 ENCOUNTER — Other Ambulatory Visit (HOSPITAL_COMMUNITY): Payer: Self-pay | Admitting: Physician Assistant

## 2024-06-05 DIAGNOSIS — F3341 Major depressive disorder, recurrent, in partial remission: Secondary | ICD-10-CM

## 2024-07-01 ENCOUNTER — Other Ambulatory Visit (HOSPITAL_COMMUNITY): Payer: Self-pay | Admitting: Physician Assistant

## 2024-07-01 DIAGNOSIS — F4010 Social phobia, unspecified: Secondary | ICD-10-CM

## 2024-07-01 DIAGNOSIS — F411 Generalized anxiety disorder: Secondary | ICD-10-CM

## 2024-07-01 DIAGNOSIS — F410 Panic disorder [episodic paroxysmal anxiety] without agoraphobia: Secondary | ICD-10-CM

## 2024-07-08 ENCOUNTER — Telehealth (HOSPITAL_COMMUNITY): Admitting: Physician Assistant

## 2024-07-08 DIAGNOSIS — F4010 Social phobia, unspecified: Secondary | ICD-10-CM

## 2024-07-08 DIAGNOSIS — F3341 Major depressive disorder, recurrent, in partial remission: Secondary | ICD-10-CM

## 2024-07-08 DIAGNOSIS — F4001 Agoraphobia with panic disorder: Secondary | ICD-10-CM

## 2024-07-08 DIAGNOSIS — F410 Panic disorder [episodic paroxysmal anxiety] without agoraphobia: Secondary | ICD-10-CM

## 2024-07-08 DIAGNOSIS — F411 Generalized anxiety disorder: Secondary | ICD-10-CM

## 2024-07-08 MED ORDER — CLONAZEPAM 0.5 MG PO TABS: 0.5000 mg | ORAL_TABLET | Freq: Two times a day (BID) | ORAL | 0 refills | Status: DC

## 2024-07-08 MED ORDER — ARIPIPRAZOLE 5 MG PO TABS: 5.0000 mg | ORAL_TABLET | Freq: Every day | ORAL | 0 refills | Status: DC

## 2024-07-08 NOTE — Progress Notes (Signed)
 BH MD/PA/NP OP Progress Note  Virtual Visit via Video Note  I connected with [name] on 07/08/24 at  3:00 PM EDT by a video enabled telemedicine application and verified that I am speaking with the correct person using two identifiers.  Location: Patient: Home Provider: Clinic   I discussed the limitations of evaluation and management by telemedicine and the availability of in person appointments. The patient expressed understanding and agreed to proceed.  Follow Up Instructions:   I discussed the assessment and treatment plan with the patient. The patient was provided an opportunity to ask questions and all were answered. The patient agreed with the plan and demonstrated an understanding of the instructions.   The patient was advised to call back or seek an in-person evaluation if the symptoms worsen or if the condition fails to improve as anticipated.  I provided 12 minutes of non-face-to-face time during this encounter.  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  Patient is currently being managed on the following psychiatric medications:  Abilify  5 mg daily Klonopin  0.5 mg 2 times daily as needed Neurontin  300 mg 3 times daily Trazodone  50 mg at bedtime  Patient presents to the encounter stating that she has been without her Abilify  for roughly a week.  Since being without her Abilify , patient notes that she does not feel.  She denies overt depressive symptoms and continues to endorse  minimal anxiety.  Patient continues to use clonazepam  as needed and denies experiencing any adverse side effects.  Patient denies any new stressors at this time.  A PHQ-9 screen was performed with the patient scoring a 0.  A GAD-7 screen was also performed with the patient scoring a 0.  Patient is alert and oriented x 4, calm, cooperative, and fully engaged in conversation during the encounter.  Patient endorses great mood.  Patient exhibits euthymic mood with appropriate affect.  Patient denies suicidal or homicidal ideations.  She further denies auditory or visual hallucinations and does not appear to be responding to internal/external stimuli.  Patient endorses good sleep and receives on average 8 hours of sleep per night.  Patient endorses good appetite needs on average 3 meals per day.  Patient denies alcohol consumption or illicit drug use.  Patient denies tobacco use but does engage in vaping.  Visit Diagnosis:    ICD-10-CM   1. MDD (major depressive disorder), recurrent, in partial remission (HCC)  F33.41 ARIPiprazole  (ABILIFY ) 5 MG tablet    2. GAD (generalized anxiety disorder)  F41.1 clonazePAM  (KLONOPIN ) 0.5 MG tablet    3. Social anxiety disorder  F40.10 clonazePAM  (KLONOPIN ) 0.5 MG tablet    4. Panic disorder  F41.0 clonazePAM  (KLONOPIN ) 0.5 MG tablet      Past Psychiatric History:  Major depressive disorder (recurrent, in partial remission) Generalized anxiety disorder Social anxiety disorder Panic disorder  Past Medical History:  Past Medical History:  Diagnosis Date   ADHD (attention deficit hyperactivity disorder)    Anxiety    Depression    Hx of staphylococcal septicemia  Past Surgical History:  Procedure Laterality Date   ABDOMINAL HYSTERECTOMY     BACK SURGERY     left elbow surgery     VIDEO BRONCHOSCOPY N/A 01/27/2013   Procedure: VIDEO BRONCHOSCOPY WITH FLUORO;  Surgeon: Belvie FORBES Silvan, MD;  Location: [facility] ENDOSCOPY;  Service: Cardiopulmonary;   Laterality: N/A;    Family Psychiatric History:  Patient believes that her mother may have had a psychiatric history but she does not know the specifics.   Family History: No family history on file.  Social History:  Social History   Socioeconomic History   Marital status: Widowed    Spouse name: Not on file   Number of children: Not on file   Years of education: Not on file   Highest education level: Not on file  Occupational History   Not on file  Tobacco Use   Smoking status: Former    Current packs/day: 1.00    Average packs/day: 1 pack/day for 20.0 years (20.0 ttl pk-yrs)    Types: Cigarettes   Smokeless tobacco: Never  Vaping Use   Vaping status: Never Used  Substance and Sexual Activity   Alcohol use: Not Currently   Drug use: Not Currently    Types: Marijuana   Sexual activity: Not on file  Other Topics Concern   Not on file  Social History Narrative   Not on file   Social Drivers of Health   Financial Resource Strain: Not on file  Food Insecurity: Patient Declined (09/05/2022)   Hunger Vital Sign    Worried About Running Out of Food in the Last Year: Patient declined    Ran Out of Food in the Last Year: Patient declined  Transportation Needs: Patient Declined (09/05/2022)   PRAPARE - Administrator, Civil Service (Medical): Patient declined    Lack of Transportation (Non-Medical): Patient declined  Physical Activity: Not on file  Stress: Not on file  Social Connections: Unknown (04/27/2022)   [facility] from [facility]   Social Network    Social Network: Not on file    Allergies:  Allergies  Allergen Reactions   Penicillins Other (See Comments)    Childhood reaction- exact reaction not cited    Metabolic Disorder Labs: Lab Results  Component Value Date   HGBA1C 5.3 04/14/2024   MPG 96.8 09/02/2022   MPG 96.8 08/08/2022   Lab Results  Component Value Date   PROLACTIN 19.2 11/13/2020   Lab Results  Component Value Date   CHOL  223 (H) 04/14/2024   TRIG 75 04/14/2024   HDL 79 04/14/2024   CHOLHDL 2.8 04/14/2024   VLDL 8 09/02/2022   LDLCALC 131 (H) 04/14/2024   LDLCALC 104 (H) 09/02/2022   Lab Results  Component Value Date   TSH 1.564 08/31/2022   TSH 1.455 08/08/2022    Therapeutic Level Labs: No results found for: LITHIUM No results found for: VALPROATE No results found for: CBMZ  Current Medications: Current Outpatient Medications  Medication Sig Dispense Refill   ARIPiprazole  (ABILIFY ) 5 MG tablet Take 1 tablet (5 mg total) by mouth daily. 90 tablet 0   clonazePAM  (KLONOPIN ) 0.5 MG tablet Take 1 tablet (0.5 mg total) by mouth 2 (two) times daily. 60 tablet 0   gabapentin  (NEURONTIN ) 300 MG capsule Take 1 capsule (300 mg total) by mouth 3 (three) times daily. 90 capsule 2   hydrOXYzine  (ATARAX ) 25 MG tablet Take 1 tablet (25 mg total) by mouth 3 (three) times daily as needed for  anxiety. 75 tablet 1   traZODone  (DESYREL ) 50 MG tablet Take 1 tablet (50 mg total) by mouth at bedtime as needed. for sleep 90 tablet 0   No current facility-administered medications for this visit.     Musculoskeletal: Strength & Muscle Tone: within normal limits Gait & Station: normal Patient leans: N/A  Psychiatric Specialty Exam: Review of Systems  Psychiatric/Behavioral:  Negative for decreased concentration, dysphoric mood, hallucinations, self-injury, sleep disturbance and suicidal ideas. The patient is nervous/anxious. The patient is not hyperactive.     There were no vitals taken for this visit.There is no height or weight on file to calculate BMI.  General Appearance: Casual  Eye Contact:  Good  Speech:  Clear and Coherent and Normal Rate  Volume:  Normal  Mood:  Anxious  Affect:  Appropriate  Thought Process:  Coherent and Descriptions of Associations: Intact  Orientation:  Full (Time, Place, and Person)  Thought Content: WDL   Suicidal Thoughts:  No  Homicidal Thoughts:  No  Memory:   Immediate;   Fair Recent;   Fair Remote;   Fair  Judgement:  Fair  Insight:  Fair  Psychomotor Activity:  Normal  Concentration:  Concentration: Good and Attention Span: Good  Recall:  Good  Fund of Knowledge: Good  Language: Good  Akathisia:  No  Handed:  Right  AIMS (if indicated): not done  Assets:  Communication Skills Desire for Improvement Housing Social Support  ADL's:  Intact  Cognition: WNL  Sleep:  Good   Screenings: AIMS    Flowsheet Row Video Visit from 07/08/2024 in [facility]) [facility]) [facility]  AIMS Total Score 0 0 0   AUDIT    Flowsheet Row Ad[facility] ([facility]) [facility]-Admission (Discharged) [facility]) [facility]  Alcohol Use Disorder Identification Test Final Score (AUDIT) 1 1 0   GAD-7    Flowsheet Row Video Visit from 07/08/2024 in [facility] Video Visit from 04/08/2024 in [facility] Video Visit from 01/08/2024 in [facility] Video Visit from 10/09/2023 in [facility] Video Visit from 07/10/2023 in [facility]  Total GAD-7 Score 0 10 8 10 8    PHQ2-9    Flowsheet Row Video Visit from 07/08/2024 in [facility] Video Visit from 04/08/2024 in [facility] Video Visit from 01/08/2024 in [facility] Video Visit from 10/09/2023 in [facility] Video Visit from 07/10/2023 in [facility]  PHQ-2 Total Score 0 0 0 1  0   Flowsheet Row Video Visit from 07/08/2024 in [facility] Video Visit from 04/08/2024 in [facility] Video Visit from 01/08/2024 in [facility]  C-SSRS RISK CATEGORY Moderate Risk Moderate Risk Moderate Risk     Assessment and Plan:   [name] is a 53 year old, Caucasian female with a past psychiatric history significant for major depressive 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  Patient  presents to the encounter stating that she has been without her Abilify  for roughly a week.  An aims assessment was performed with the patient scoring a 0.  Despite being without her Abilify  for a week, patient reports that her mood has been well and denies overt depressive symptoms.  Patient endorses minimal anxiety.  She reports that she still continues to use clonazepam  as needed and denies experiencing any adverse side effects from her use of the medication.  A PHQ-9 screen was performed with the patient scoring a 0.  A GAD-7 screen was also performed with the patient scoring a 0.  Patient would like to continue taking her medications as prescribed.  Patient's medications to be e-prescribed to pharmacy of choice.  A [place] Suicide Severity Rating Scale was performed with the patient being considered moderate risk.  Patient denies suicidal ideations and is able to contract for safety at this time.  Safety planning was discussed with the patient prior to the conclusion of the encounter.  - Patient was instructed to contact 911 in the event of a mental health crisis. - Patient was instructed to contact [address] [facility] [employer] Crisis [employer] in the event of a mental health crisis. - Patient was instructed to present to [facility] in the event of a mental health crisis.  Collaboration of Care: Collaboration of Care: Medication Management AEB provider managing patient's psychiatric medications, Primary Care Provider AEB patient being followed by a primary care provider, and Psychiatrist AEB patient being followed by mental health provider at this facility  Patient/Guardian was advised Release of Information must be obtained prior to any record release in order to collaborate their care with an outside provider. Patient/Guardian was advised if they have not already done so to contact the registration department to sign all necessary forms in order for us  to release information regarding their care.   Consent: Patient/Guardian gives verbal consent for treatment and assignment of benefits for services provided during this visit. Patient/Guardian expressed understanding and agreed to proceed.   1. MDD (major depressive disorder), recurrent, in partial remission (HCC) Patient to continue taking trazodone  50 mg at bedtime as needed for the management of her sleep  - ARIPiprazole  (ABILIFY ) 5 MG tablet; Take 1 tablet (5 mg total) by mouth daily.  Dispense: 90 tablet; Refill: 0  2. GAD (generalized anxiety disorder) Patient to continue taking gabapentin  300 mg 3 times daily for the management of their generalized anxiety disorder  - clonazePAM  (KLONOPIN ) 0.5 MG tablet; Take 1 tablet (0.5 mg total) by mouth 2 (two) times daily.  Dispense: 60 tablet; Refill: 0  3. Social anxiety disorder  - clonazePAM  (KLONOPIN ) 0.5 MG tablet; Take 1 tablet (0.5 mg total) by mouth 2 (two) times daily.  Dispense: 60 tablet; Refill: 0  4. Panic disorder  - clonazePAM  (KLONOPIN ) 0.5 MG tablet; Take 1 tablet (0.5 mg total) by mouth 2 (two) times daily.  Dispense: 60 tablet; Refill: 0  Patient to follow-up within 3 months Provider spent a total of 12 minutes with the patient/reviewing patient's chart  Reginia FORBES Bolster, PA 07/08/2024, 5:18 PM

## 2024-07-19 ENCOUNTER — Encounter (HOSPITAL_COMMUNITY): Payer: Self-pay | Admitting: Physician Assistant

## 2024-08-05 ENCOUNTER — Other Ambulatory Visit (HOSPITAL_COMMUNITY): Payer: Self-pay | Admitting: Physician Assistant

## 2024-08-05 DIAGNOSIS — F4010 Social phobia, unspecified: Secondary | ICD-10-CM

## 2024-08-05 DIAGNOSIS — F3341 Major depressive disorder, recurrent, in partial remission: Secondary | ICD-10-CM

## 2024-08-05 DIAGNOSIS — F410 Panic disorder [episodic paroxysmal anxiety] without agoraphobia: Secondary | ICD-10-CM

## 2024-08-05 DIAGNOSIS — F411 Generalized anxiety disorder: Secondary | ICD-10-CM

## 2024-08-11 ENCOUNTER — Other Ambulatory Visit (HOSPITAL_COMMUNITY): Payer: Self-pay | Admitting: Physician Assistant

## 2024-08-11 ENCOUNTER — Telehealth (HOSPITAL_COMMUNITY): Payer: Self-pay

## 2024-08-11 DIAGNOSIS — F411 Generalized anxiety disorder: Secondary | ICD-10-CM

## 2024-08-11 DIAGNOSIS — F410 Panic disorder [episodic paroxysmal anxiety] without agoraphobia: Secondary | ICD-10-CM

## 2024-08-11 DIAGNOSIS — F3341 Major depressive disorder, recurrent, in partial remission: Secondary | ICD-10-CM

## 2024-08-11 DIAGNOSIS — F4010 Social phobia, unspecified: Secondary | ICD-10-CM

## 2024-08-11 MED ORDER — GABAPENTIN 300 MG PO CAPS: 300.0000 mg | ORAL_CAPSULE | Freq: Three times a day (TID) | ORAL | 2 refills | Status: DC

## 2024-08-11 MED ORDER — TRAZODONE HCL 50 MG PO TABS: 50.0000 mg | ORAL_TABLET | Freq: Every evening | ORAL | 0 refills | Status: DC | PRN

## 2024-08-11 MED ORDER — ARIPIPRAZOLE 5 MG PO TABS: 5.0000 mg | ORAL_TABLET | Freq: Every day | ORAL | 0 refills | Status: DC

## 2024-08-11 MED ORDER — CLONAZEPAM 0.5 MG PO TABS: 0.5000 mg | ORAL_TABLET | Freq: Two times a day (BID) | ORAL | 0 refills | Status: DC

## 2024-08-11 NOTE — Telephone Encounter (Signed)
 Message acknowledged and reviewed.  Patient's medications were sent to pharmacy of choice.

## 2024-08-11 NOTE — Telephone Encounter (Signed)
 Pt is requesting all medications to be refilled and sent to the pharmacy

## 2024-08-11 NOTE — Progress Notes (Signed)
 Provider was contacted by Arland PHEBE Pander, CMA regarding patient's request for medication refill.  PDMP was reviewed prior to refilling patient's medication.  Patient's medications to be e-prescribed to pharmacy of choice.

## 2024-09-11 ENCOUNTER — Other Ambulatory Visit (HOSPITAL_COMMUNITY): Payer: Self-pay | Admitting: Physician Assistant

## 2024-09-11 DIAGNOSIS — F410 Panic disorder [episodic paroxysmal anxiety] without agoraphobia: Secondary | ICD-10-CM

## 2024-09-11 DIAGNOSIS — F4010 Social phobia, unspecified: Secondary | ICD-10-CM

## 2024-09-11 DIAGNOSIS — F411 Generalized anxiety disorder: Secondary | ICD-10-CM

## 2024-09-15 ENCOUNTER — Telehealth (HOSPITAL_COMMUNITY): Payer: Self-pay | Admitting: *Deleted

## 2024-09-15 NOTE — Telephone Encounter (Signed)
 Pt called [facility] office requesting a refill of Klonopin  0.5 mg BID. Last e-scribed 08/11/24.    Last visit: 07/08/24 Next visit: 10/13/24

## 2024-09-17 ENCOUNTER — Telehealth (HOSPITAL_COMMUNITY): Payer: Self-pay

## 2024-09-17 NOTE — Telephone Encounter (Signed)
 Message acknowledged and reviewed. Patient's medication to be e-prescribed to pharmacy of choice.

## 2024-09-17 NOTE — Telephone Encounter (Signed)
 Patient called in requesting refill of Klonopin  0.5 mg. Patient has appointment with provider 10/13/24

## 2024-10-07 ENCOUNTER — Telehealth (HOSPITAL_COMMUNITY): Admitting: Physician Assistant

## 2024-10-13 ENCOUNTER — Telehealth (INDEPENDENT_AMBULATORY_CARE_PROVIDER_SITE_OTHER): Admitting: Physician Assistant

## 2024-10-13 ENCOUNTER — Encounter (HOSPITAL_COMMUNITY): Payer: Self-pay | Admitting: Physician Assistant

## 2024-10-13 DIAGNOSIS — F410 Panic disorder [episodic paroxysmal anxiety] without agoraphobia: Secondary | ICD-10-CM | POA: Diagnosis not present

## 2024-10-13 DIAGNOSIS — F411 Generalized anxiety disorder: Secondary | ICD-10-CM

## 2024-10-13 DIAGNOSIS — F3341 Major depressive disorder, recurrent, in partial remission: Secondary | ICD-10-CM | POA: Diagnosis not present

## 2024-10-13 DIAGNOSIS — F4010 Social phobia, unspecified: Secondary | ICD-10-CM | POA: Diagnosis not present

## 2024-10-13 DIAGNOSIS — Z79899 Other long term (current) drug therapy: Secondary | ICD-10-CM | POA: Diagnosis not present

## 2024-10-13 MED ORDER — GABAPENTIN 300 MG PO CAPS: 300.0000 mg | ORAL_CAPSULE | Freq: Three times a day (TID) | ORAL | 2 refills | Status: DC

## 2024-10-13 MED ORDER — CLONAZEPAM 0.5 MG PO TABS: 0.5000 mg | ORAL_TABLET | Freq: Two times a day (BID) | ORAL | 0 refills | Status: DC

## 2024-10-13 MED ORDER — ARIPIPRAZOLE 5 MG PO TABS: 5.0000 mg | ORAL_TABLET | Freq: Every day | ORAL | 0 refills | Status: DC

## 2024-10-13 MED ORDER — TRAZODONE HCL 50 MG PO TABS: 50.0000 mg | ORAL_TABLET | Freq: Every evening | ORAL | 0 refills | Status: DC | PRN

## 2024-10-13 NOTE — Progress Notes (Signed)
 [facility] MD/PA/NP OP Progress Note  Virtual Visit via Video Note  I connected with [name] on 10/13/24 at  3:00 PM EDT by a video enabled telemedicine application and verified that I am speaking with the correct person using two identifiers.  Location: Patient: Home Provider: Clinic   I discussed the limitations of evaluation and management by telemedicine and the availability of in person appointments. The patient expressed understanding and agreed to proceed.  Follow Up Instructions:   I discussed the assessment and treatment plan with the patient. The patient was provided an opportunity to ask questions and all were answered. The patient agreed with the plan and demonstrated an understanding of the instructions.   The patient was advised to call back or seek an in-person evaluation if the symptoms worsen or if the condition fails to improve as anticipated.  I provided 17 minutes of non-face-to-face time during this encounter.  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  Patient is currently being managed on the following psychiatric medications:  Abilify  5 mg daily Klonopin  0.5 mg 2 times daily as needed Neurontin  300 mg 3 times daily Trazodone  50 mg at bedtime  Patient presents to the encounter stating that she has been taking her medications regularly and denies experiencing any adverse side effects.  She reports that she has been dealing with stressors related to her children losing their  father as well as the passing of their 4 year old dog.  She also reports that her children's grandmother has been given a few months to live.  Despite these stressors, patient denies overt depression.  She endorses some sadness but states that she has been functioning relatively normally.  She endorses some anxiety attributed to her current life stressors.  A PHQ 2 screen was performed with the patient scoring a 0.  A GAD-7 screen was also performed with the patient scoring a 5.  In regards to her use of clonazepam , patient reports the medication continues to be effective in managing her anxiety.  Patient denies experiencing any adverse side effects associated with the use of Adderall clonazepam .  Patient is alert and oriented x 4, calm, cooperative, and fully engaged in conversation during the encounter.  Patient endorses good mood.  Patient exhibits euthymic mood with appropriate affect.  Patient denies suicidal or homicidal ideations.  She further denies auditory or visual hallucinations and does not appear to be responding to internal/external stimuli.  Patient endorses fair sleep and receives on average 8 hours of intermittent sleep per night.  She reports she has a tendency to wake up every 2-3 hours.  Patient endorses fair appetite and eats on average 3 meals per day.  Patient denies alcohol consumption or illicit drug use.  Patient denies tobacco use but does engage in vaping.  Visit Diagnosis:    ICD-10-CM   1. Long-term current use of benzodiazepine  Z79.899 Urine Drug Panel 7    Urine Drug Panel 7    2. GAD (generalized anxiety disorder)  F41.1 clonazePAM  (KLONOPIN ) 0.5 MG tablet  gabapentin  (NEURONTIN ) 300 MG capsule    3. Social anxiety disorder  F40.10 clonazePAM  (KLONOPIN ) 0.5 MG tablet    4. Panic disorder  F41.0 clonazePAM  (KLONOPIN ) 0.5 MG tablet    5. MDD (major depressive disorder), recurrent, in partial remission  F33.41 ARIPiprazole  (ABILIFY ) 5 MG tablet    traZODone   (DESYREL ) 50 MG tablet    6. Long term current use of antipsychotic medication  Z79.899       Past Psychiatric History:  Major depressive disorder (recurrent, in partial remission) Generalized anxiety disorder Social anxiety disorder Panic disorder  Past Medical History:  Past Medical History:  Diagnosis Date   ADHD (attention deficit hyperactivity disorder)    Anxiety    Depression    Hx of staphylococcal septicemia     Past Surgical History:  Procedure Laterality Date   ABDOMINAL HYSTERECTOMY     BACK SURGERY     left elbow surgery     VIDEO BRONCHOSCOPY N/A 01/27/2013   Procedure: VIDEO BRONCHOSCOPY WITH FLUORO;  Surgeon: Belvie FORBES Silvan, MD;  Location: [facility] ENDOSCOPY;  Service: Cardiopulmonary;  Laterality: N/A;    Family Psychiatric History:  Patient believes that her mother may have had a psychiatric history but she does not know the specifics.   Family History: History reviewed. No pertinent family history.  Social History:  Social History   Socioeconomic History   Marital status: Widowed    Spouse name: Not on file   Number of children: Not on file   Years of education: Not on file   Highest education level: Not on file  Occupational History   Not on file  Tobacco Use   Smoking status: Former    Current packs/day: 1.00    Average packs/day: 1 pack/day for 20.0 years (20.0 ttl pk-yrs)    Types: Cigarettes   Smokeless tobacco: Never  Vaping Use   Vaping status: Never Used  Substance and Sexual Activity   Alcohol use: Not Currently   Drug use: Not Currently    Types: Marijuana   Sexual activity: Not on file  Other Topics Concern   Not on file  Social History Narrative   Not on file   Social Drivers of Health   Financial Resource Strain: Not on file  Food Insecurity: Patient Declined (09/05/2022)   Hunger Vital Sign    Worried About Running Out of Food in the Last Year: Patient declined    Ran Out of Food in the Last Year: Patient declined   Transportation Needs: Patient Declined (09/05/2022)   PRAPARE - Administrator, Civil Service (Medical): Patient declined    Lack of Transportation (Non-Medical): Patient declined  Physical Activity: Not on file  Stress: Not on file  Social Connections: Unknown (04/27/2022)   Received from [facility]   Social Network    Social Network: Not on file    Allergies:  Allergies  Allergen Reactions   Penicillins Other (See Comments)    Childhood reaction- exact reaction not cited    Metabolic Disorder Labs: Lab Results  Component Value Date   HGBA1C 5.3 04/14/2024   MPG 96.8 09/02/2022   MPG 96.8 08/08/2022   Lab Results  Component Value Date   PROLACTIN 19.2 11/13/2020   Lab Results  Component Value Date   CHOL 223 (H) 04/14/2024   TRIG 75 04/14/2024   HDL 79 04/14/2024   CHOLHDL 2.8 04/14/2024   VLDL 8 09/02/2022   LDLCALC 131 (H) 04/14/2024   LDLCALC 104 (H) 09/02/2022  Lab Results  Component Value Date   TSH 1.564 08/31/2022   TSH 1.455 08/08/2022    Therapeutic Level Labs: No results found for: LITHIUM No results found for: VALPROATE No results found for: CBMZ  Current Medications: Current Outpatient Medications  Medication Sig Dispense Refill   ARIPiprazole  (ABILIFY ) 5 MG tablet Take 1 tablet (5 mg total) by mouth daily. 90 tablet 0   [START ON 10/17/2024] clonazePAM  (KLONOPIN ) 0.5 MG tablet Take 1 tablet (0.5 mg total) by mouth 2 (two) times daily. 60 tablet 0   gabapentin  (NEURONTIN ) 300 MG capsule Take 1 capsule (300 mg total) by mouth 3 (three) times daily. 90 capsule 2   hydrOXYzine  (ATARAX ) 25 MG tablet Take 1 tablet (25 mg total) by mouth 3 (three) times daily as needed for anxiety. 75 tablet 1   traZODone  (DESYREL ) 50 MG tablet Take 1 tablet (50 mg total) by mouth at bedtime as needed. for sleep 90 tablet 0   No current facility-administered medications for this visit.     Musculoskeletal: Strength & Muscle Tone: within  normal limits Gait & Station: normal Patient leans: N/A  Psychiatric Specialty Exam: Review of Systems  Psychiatric/Behavioral:  Positive for sleep disturbance. Negative for decreased concentration, dysphoric mood, hallucinations, self-injury and suicidal ideas. The patient is nervous/anxious. The patient is not hyperactive.     There were no vitals taken for this visit.There is no height or weight on file to calculate BMI.  General Appearance: Casual  Eye Contact:  Good  Speech:  Clear and Coherent and Normal Rate  Volume:  Normal  Mood:  Anxious  Affect:  Appropriate  Thought Process:  Coherent and Descriptions of Associations: Intact  Orientation:  Full (Time, Place, and Person)  Thought Content: WDL   Suicidal Thoughts:  No  Homicidal Thoughts:  No  Memory:  Immediate;   Fair Recent;   Fair Remote;   Fair  Judgement:  Fair  Insight:  Fair  Psychomotor Activity:  Normal  Concentration:  Concentration: Good and Attention Span: Good  Recall:  Good  Fund of Knowledge: Good  Language: Good  Akathisia:  No  Handed:  Right  AIMS (if indicated): not done  Assets:  Communication Skills Desire for Improvement Housing Social Support  ADL's:  Intact  Cognition: WNL  Sleep:  Fair   Screenings: AIMS    Flowsheet Row Video Visit from 10/13/2024 in [facility] Video Visit from 07/08/2024 in [facility]Dis[facility]) [facility]) [facility]  AIMS Total Score 0 0 0 0   AUDIT    Flowsheet Row Ad[facility] ([facility]charged) [facility]-Admission (Discharged) [facility] (Dis[facility]) [facility]  Alcohol Use Disorder  Identification Test Final Score (AUDIT) 1 1 0   GAD-7    Flowsheet Row Video Visit from 10/13/2024 in [facility] Video Visit from 07/08/2024 in [facility] Video Visit from 04/08/2024 in [facility] Video Visit from 01/08/2024 in [facility] Video Visit from 10/09/2023 in [facility]  Total GAD-7 Score 5 0 10 8 10    PHQ2-9    Flowsheet Row Video Visit from 10/13/2024 in [facility] Video Visit from  07/08/2024 in [facility] Video Visit from 04/08/2024 in [facility] Video Visit from 01/08/2024 in [facility] Video Visit from 10/09/2023 in [facility]  PHQ-2 Total Score 0 0 0 0 1   Flowsheet Row Video Visit from 10[facility]13/2024 [facility] Video Visit from 07/08/2024 in [facility] Video Visit from 04/08/2024 in [facility]  C-SSRS RISK CATEGORY Moderate Risk Moderate Risk Moderate Risk     Assessment and Plan:   Carletha R. 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  Patient presents to the encounter stating that she has been taking her medications regularly and denies experiencing any adverse side effects.  An aims assessment was performed due to her use of Abilify  with the patient scoring a 0.  Despite some life stressors, patient denies overt depressive symptoms.  She does endorse some anxiety attributed to these life stressors but  states that she has been managing well.  A PHQ 2 screen was performed with the patient scoring a 0.  A GAD-7 screen was also performed the patient scoring a 5.  Patient continues to endorse stability on her current medication regimen and would like to continue taking her medications as prescribed.  Patient's medications to be e-prescribed to pharmacy of choice.  Patient's labs are up-to-date.  Provider informed patient that the urine drug screen will need to be obtained due to her use of clonazepam .  Patient vocalized understanding.  Due to her use of Abilify , provider scheduled an appointment for an EKG.  Patient vocalized understanding.  A [place] Suicide Severity Rating Scale was performed with the patient being considered moderate risk.  Patient denies suicidal ideations and is able to contract for safety at this time.  Safety planning was discussed with the patient prior to the conclusion of the encounter.  - Patient was instructed to contact 911 in the event of a mental health crisis. - Patient was instructed to contact [facility] in the event of a mental health crisis. - Patient was instructed to present to [facility] in the event of a mental health crisis.  Collaboration of Care: Collaboration of Care: Medication Management AEB provider managing patient's psychiatric medications, Primary Care Provider AEB patient being followed by a primary care provider, and Psychiatrist AEB patient being followed by mental health provider at this facility  Patient/Guardian was advised Release of Information must be obtained prior to any record release in order to collaborate their care with an outside provider. Patient/Guardian was advised if they have not already done so to contact the registration department to sign all necessary forms in order for us  to release information regarding their care.   Consent: Patient/Guardian gives verbal consent for  treatment and assignment of benefits for services provided during this visit. Patient/Guardian expressed understanding and agreed to proceed.   1. GAD (generalized anxiety disorder)  - clonazePAM  (KLONOPIN ) 0.5 MG tablet; Take 1 tablet (0.5 mg total) by mouth 2 (two) times daily.  Dispense: 60 tablet; Refill: 0 - gabapentin  (NEURONTIN ) 300 MG capsule; Take 1 capsule (300 mg total) by mouth 3 (three) times daily.  Dispense: 90 capsule; Refill: 2  2. Social anxiety disorder  - clonazePAM  (KLONOPIN ) 0.5 MG tablet; Take 1 tablet (0.5 mg total) by mouth 2 (two) times daily.  Dispense: 60 tablet; Refill: 0  3. Panic disorder  - clonazePAM  (KLONOPIN ) 0.5 MG tablet; Take 1 tablet (0.5 mg total) by mouth 2 (two) times daily.  Dispense: 60 tablet; Refill: 0  4. MDD (major depressive disorder), recurrent, in partial remission  - ARIPiprazole  (ABILIFY ) 5 MG tablet; Take 1 tablet (5 mg total) by mouth daily.  Dispense: 90 tablet; Refill: 0 - traZODone  (DESYREL ) 50 MG tablet; Take 1 tablet (50 mg total) by mouth at bedtime as needed. for sleep  Dispense: 90 tablet; Refill: 0  5. Long-term current use of benzodiazepine (Primary)  - Urine Drug Panel 7; Future  6. Long term current use of antipsychotic medication Patient to be scheduled for an EKG at this facility  Patient to follow-up within 3 months Provider spent a total of 17 minutes with the patient/reviewing patient's chart  Reginia FORBES Bolster, PA 10/13/2024, 8:18 PM

## 2024-10-18 ENCOUNTER — Other Ambulatory Visit (HOSPITAL_COMMUNITY): Payer: Self-pay | Admitting: Physician Assistant

## 2024-10-18 DIAGNOSIS — F410 Panic disorder [episodic paroxysmal anxiety] without agoraphobia: Secondary | ICD-10-CM

## 2024-10-18 DIAGNOSIS — F4010 Social phobia, unspecified: Secondary | ICD-10-CM

## 2024-10-18 DIAGNOSIS — F411 Generalized anxiety disorder: Secondary | ICD-10-CM

## 2024-10-19 ENCOUNTER — Encounter (HOSPITAL_COMMUNITY): Payer: Self-pay

## 2024-10-19 ENCOUNTER — Ambulatory Visit (INDEPENDENT_AMBULATORY_CARE_PROVIDER_SITE_OTHER)

## 2024-10-19 DIAGNOSIS — Z79899 Other long term (current) drug therapy: Secondary | ICD-10-CM | POA: Diagnosis not present

## 2024-10-19 DIAGNOSIS — Z1211 Encounter for screening for malignant neoplasm of colon: Secondary | ICD-10-CM | POA: Diagnosis not present

## 2024-10-19 NOTE — Progress Notes (Signed)
 Patient presents to the office for EKG , was preformed by Arland Armin Manuel with no issue or complaints , pt was informed that provider will reach out with results .

## 2024-10-20 DIAGNOSIS — Z79899 Other long term (current) drug therapy: Secondary | ICD-10-CM | POA: Diagnosis not present

## 2024-10-23 LAB — URINE DRUG PANEL 7
Amphetamines, Urine: NEGATIVE ng/mL
Barbiturate Quant, Ur: NEGATIVE ng/mL
Benzodiazepine Quant, Ur: NEGATIVE ng/mL
Cocaine (Metab.): NEGATIVE ng/mL
Creatinine, Urine: 48 mg/dL (ref 20.0–300.0)
Nitrite Urine, Quantitative: NEGATIVE ug/mL
OPIATE SCREEN URINE: NEGATIVE ng/mL
PCP Quant, Ur: NEGATIVE ng/mL
pH, Urine: 5.3 (ref 4.5–8.9)

## 2024-10-23 LAB — CANNABINOID CONFIRMATION, UR
CANNABINOIDS: POSITIVE — AB
Carboxy THC GC/MS Conf: 68 ng/mL

## 2024-10-26 LAB — COLOGUARD: COLOGUARD: NEGATIVE

## 2024-11-17 ENCOUNTER — Other Ambulatory Visit (HOSPITAL_COMMUNITY): Payer: Self-pay | Admitting: Physician Assistant

## 2024-11-17 DIAGNOSIS — F411 Generalized anxiety disorder: Secondary | ICD-10-CM

## 2024-11-17 DIAGNOSIS — F4010 Social phobia, unspecified: Secondary | ICD-10-CM

## 2024-11-17 DIAGNOSIS — F410 Panic disorder [episodic paroxysmal anxiety] without agoraphobia: Secondary | ICD-10-CM

## 2024-11-19 ENCOUNTER — Telehealth (HOSPITAL_COMMUNITY): Payer: Self-pay

## 2024-11-19 NOTE — Telephone Encounter (Signed)
 Message acknowledged and reviewed. Patient's medication to be refilled.

## 2024-11-19 NOTE — Telephone Encounter (Signed)
 Patient called in requesting Klonopin  0.5 mg. Script was sent on 11/17/24. Writer verified with pharmacy. The script is not showing in the system. When time allows will you resend this medication?

## 2024-12-18 ENCOUNTER — Other Ambulatory Visit (HOSPITAL_COMMUNITY): Payer: Self-pay | Admitting: Physician Assistant

## 2024-12-18 DIAGNOSIS — F411 Generalized anxiety disorder: Secondary | ICD-10-CM

## 2024-12-18 DIAGNOSIS — F4010 Social phobia, unspecified: Secondary | ICD-10-CM

## 2024-12-18 DIAGNOSIS — F410 Panic disorder [episodic paroxysmal anxiety] without agoraphobia: Secondary | ICD-10-CM

## 2024-12-22 ENCOUNTER — Telehealth (HOSPITAL_COMMUNITY): Payer: Self-pay

## 2024-12-22 NOTE — Telephone Encounter (Signed)
 Patient is requesting refill of clonazePAM  (KLONOPIN ) 0.5 MG tablet

## 2024-12-23 NOTE — Telephone Encounter (Signed)
 Message acknowledged and reviewed.

## 2025-01-12 ENCOUNTER — Encounter (HOSPITAL_COMMUNITY): Payer: Self-pay | Admitting: Physician Assistant

## 2025-01-12 ENCOUNTER — Telehealth (INDEPENDENT_AMBULATORY_CARE_PROVIDER_SITE_OTHER): Admitting: Physician Assistant

## 2025-01-12 DIAGNOSIS — F4010 Social phobia, unspecified: Secondary | ICD-10-CM

## 2025-01-12 DIAGNOSIS — F411 Generalized anxiety disorder: Secondary | ICD-10-CM | POA: Diagnosis not present

## 2025-01-12 DIAGNOSIS — F410 Panic disorder [episodic paroxysmal anxiety] without agoraphobia: Secondary | ICD-10-CM | POA: Diagnosis not present

## 2025-01-12 DIAGNOSIS — F3341 Major depressive disorder, recurrent, in partial remission: Secondary | ICD-10-CM | POA: Diagnosis not present

## 2025-01-12 MED ORDER — ARIPIPRAZOLE 5 MG PO TABS: 5.0000 mg | ORAL_TABLET | Freq: Every day | ORAL | 0 refills | Status: AC

## 2025-01-12 MED ORDER — CLONAZEPAM 0.5 MG PO TABS: 0.5000 mg | ORAL_TABLET | Freq: Two times a day (BID) | ORAL | 0 refills | Status: AC

## 2025-01-12 MED ORDER — TRAZODONE HCL 50 MG PO TABS: 50.0000 mg | ORAL_TABLET | Freq: Every evening | ORAL | 0 refills | Status: AC | PRN

## 2025-01-12 MED ORDER — GABAPENTIN 300 MG PO CAPS: 300.0000 mg | ORAL_CAPSULE | Freq: Three times a day (TID) | ORAL | 3 refills | Status: AC

## 2025-01-12 NOTE — Progress Notes (Signed)
 [facility] MD/PA/NP OP Progress Note  Virtual Visit via Video Note  I connected with [name] on 01/12/25 at  2:00 PM EST by a video enabled telemedicine application and verified that I am speaking with the correct person using two identifiers.  Location: Patient: Home Provider: Clinic   I discussed the limitations of evaluation and management by telemedicine and the availability of in person appointments. The patient expressed understanding and agreed to proceed.  Follow Up Instructions:   I discussed the assessment and treatment plan with the patient. The patient was provided an opportunity to ask questions and all were answered. The patient agreed with the plan and demonstrated an understanding of the instructions.   The patient was advised to call back or seek an in-person evaluation if the symptoms worsen or if the condition fails to improve as anticipated.  I provided 10 minutes of non-face-to-face time during this encounter.  Reginia FORBES Bolster, PA    01/12/2025 2:32 PM Cj Beecher Rhinehart  MRN:  [id number]  Chief Complaint:  Chief Complaint  Patient presents with   Follow-up   Medication Refill   HPI:   [name] is a 54 year old, Caucasian female with a past psychiatric history significant for major depressive disorder (in partial remission), generalized anxiety disorder, social anxiety disorder, and panic disorder who presents to [facility] via virtual video visit for follow-up and medication management.  Patient is currently being managed on the following psychiatric medications:  Abilify  5 mg daily Klonopin  0.5 mg 2 times daily as needed Neurontin  300 mg 3 times daily Trazodone  50 mg at bedtime  Patient presents to the encounter stating that she has been taking her medications regularly and denies experiencing any adverse side effects.  Patient denies overt depressive symptoms nor does she endorse anxiety.  Patient denies any  new stressors at this time.  In regards to her use of clonazepam , patient reports that she takes the medication 1-2 times per day.  Patient is unsure of when the medication kicks in but states that the medication has been effective in managing her anxiety and denies experiencing any adverse side effects.  A PHQ 2 screen was performed with the patient scoring a 0.  A GAD-7 screen was also performed with the patient scoring a 0.  Patient is alert and oriented x 4, calm, cooperative, and fully engaged in conversation during the encounter.  Patient endorses good mood.  Patient exhibits euthymic mood with appropriate affect.  Patient denies suicidal or homicidal ideations.  She further denies auditory or visual hallucinations and does not appear to be responding to internal/external stimuli.  Patient endorses good sleep and receives on average 7 to 8 hours of sleep per night.  Patient endorses good appetite and eats on average 3 meals per day.  Patient denies alcohol consumption or illicit drug use.  Patient denies tobacco use but does engage in vaping.  Visit Diagnosis:    ICD-10-CM   1. GAD (generalized anxiety disorder)  F41.1 clonazePAM  (KLONOPIN ) 0.5 MG tablet    gabapentin  (NEURONTIN ) 300 MG capsule    2. Social anxiety disorder  F40.10 clonazePAM  (KLONOPIN ) 0.5 MG tablet    3. Panic disorder  F41.0 clonazePAM  (KLONOPIN ) 0.5 MG tablet    4. MDD (major depressive disorder), recurrent, in partial remission  F33.41 ARIPiprazole  (ABILIFY ) 5 MG tablet    traZODone  (DESYREL ) 50 MG tablet      Past Psychiatric History:  Major depressive disorder (recurrent, in partial  remission) Generalized anxiety disorder Social anxiety disorder Panic disorder  Past Medical History:  Past Medical History:  Diagnosis Date   ADHD (attention deficit hyperactivity disorder)    Anxiety    Depression    Hx of staphylococcal septicemia     Past Surgical History:  Procedure Laterality Date   ABDOMINAL  HYSTERECTOMY     BACK SURGERY     left elbow surgery     VIDEO BRONCHOSCOPY N/A 01/27/2013   Procedure: VIDEO BRONCHOSCOPY WITH FLUORO;  Surgeon: Belvie FORBES Silvan, MD;  Location: [facility];  Service: Cardiopulmonary;  Laterality: N/A;    Family Psychiatric History:  Patient believes that her mother may have had a psychiatric history but she does not know the specifics.   Family History: History reviewed. No pertinent family history.  Social History:  Social History   Socioeconomic History   Marital status: Widowed    Spouse name: Not on file   Number of children: Not on file   Years of education: Not on file   Highest education level: Not on file  Occupational History   Not on file  Tobacco Use   Smoking status: Former    Current packs/day: 1.00    Average packs/day: 1 pack/day for 20.0 years (20.0 ttl pk-yrs)    Types: Cigarettes   Smokeless tobacco: Never  Vaping Use   Vaping status: Never Used  Substance and Sexual Activity   Alcohol use: Not Currently   Drug use: Not Currently    Types: Marijuana   Sexual activity: Not on file  Other Topics Concern   Not on file  Social History Narrative   Not on file   Social Drivers of Health   Tobacco Use: Medium Risk (01/12/2025)   Patient History    Smoking Tobacco Use: Former    Smokeless Tobacco Use: Never    Passive Exposure: Not on file  Financial Resource Strain: Not on file  Food Insecurity: Patient Declined (09/05/2022)   Hunger Vital Sign    Worried About Running Out of Food in the Last Year: Patient declined    Ran Out of Food in the Last Year: Patient declined  Transportation Needs: Patient Declined (09/05/2022)   PRAPARE - Administrator, Civil Service (Medical): Patient declined    Lack of Transportation (Non-Medical): Patient declined  Physical Activity: Not on file  Stress: Not on file  Social Connections: Not on file  Depression (PHQ2-9): Low Risk (01/12/2025)   Depression (PHQ2-9)     PHQ-2 Score: 0  Alcohol Screen: Low Risk (09/05/2022)   Alcohol Screen    Last Alcohol Screening Score (AUDIT): 1  Housing: Not on file  Utilities: Patient Declined (09/05/2022)   AHC Utilities    Threatened with loss of utilities: Patient refused  Health Literacy: Not on file    Allergies:  Allergies  Allergen Reactions   Penicillins Other (See Comments)    Childhood reaction- exact reaction not cited    Metabolic Disorder Labs: Lab Results  Component Value Date   HGBA1C 5.3 04/14/2024   MPG 96.8 09/02/2022   MPG 96.8 08/08/2022   Lab Results  Component Value Date   PROLACTIN 19.2 11/13/2020   Lab Results  Component Value Date   CHOL 223 (H) 04/14/2024   TRIG 75 04/14/2024   HDL 79 04/14/2024   CHOLHDL 2.8 04/14/2024   VLDL 8 09/02/2022   LDLCALC 131 (H) 04/14/2024   LDLCALC 104 (H) 09/02/2022   Lab Results  Component Value Date  TSH 1.564 08/31/2022   TSH 1.455 08/08/2022    Therapeutic Level Labs: No results found for: LITHIUM No results found for: VALPROATE No results found for: CBMZ  Current Medications: Current Outpatient Medications  Medication Sig Dispense Refill   ARIPiprazole  (ABILIFY ) 5 MG tablet Take 1 tablet (5 mg total) by mouth daily. 90 tablet 0   [START ON 01/22/2025] clonazePAM  (KLONOPIN ) 0.5 MG tablet Take 1 tablet (0.5 mg total) by mouth 2 (two) times daily. 60 tablet 0   gabapentin  (NEURONTIN ) 300 MG capsule Take 1 capsule (300 mg total) by mouth 3 (three) times daily. 90 capsule 3   hydrOXYzine  (ATARAX ) 25 MG tablet Take 1 tablet (25 mg total) by mouth 3 (three) times daily as needed for anxiety. 75 tablet 1   traZODone  (DESYREL ) 50 MG tablet Take 1 tablet (50 mg total) by mouth at bedtime as needed. for sleep 90 tablet 0   No current facility-administered medications for this visit.     Musculoskeletal: Strength & Muscle Tone: within normal limits Gait & Station: normal Patient leans: N/A  Psychiatric Specialty  Exam: Review of Systems  Psychiatric/Behavioral:  Negative for decreased concentration, dysphoric mood, hallucinations, self-injury, sleep disturbance and suicidal ideas. The patient is not nervous/anxious and is not hyperactive.     There were no vitals taken for this visit.There is no height or weight on file to calculate BMI.  General Appearance: Casual  Eye Contact:  Good  Speech:  Clear and Coherent and Normal Rate  Volume:  Normal  Mood:  Euthymic  Affect:  Appropriate  Thought Process:  Coherent and Descriptions of Associations: Intact  Orientation:  Full (Time, Place, and Person)  Thought Content: WDL   Suicidal Thoughts:  No  Homicidal Thoughts:  No  Memory:  Immediate;   Good Recent;   Fair Remote;   Fair  Judgement:  Fair  Insight:  Fair  Psychomotor Activity:  Normal  Concentration:  Concentration: Good and Attention Span: Good  Recall:  Good  Fund of Knowledge: Good  Language: Good  Akathisia:  No  Handed:  Right  AIMS (if indicated): not done  Assets:  Communication Skills Desire for Improvement Housing Social Support  ADL's:  Intact  Cognition: WNL  Sleep:  Good   Screenings: AIMS    Flowsheet Row Video Visit from 01/12/2025 in [facility] Video Visit from [facility]13[facility] Video Visit from 07/08/2024 in [facility]Dis[facility]) [facility]) [facility]  AIMS Total Score 0 0 0 0 0   AUDIT    Flowsheet Row Ad[facility] (Discharged) [facility]-Admission (Discharged) [facility]) [facility]  Alcohol Use Disorder Identification Test Final Score  (AUDIT) 1 1 0   GAD-7    Flowsheet Row Video Visit from 01/12/2025 in [facility] Video Visit from 10/13/2024 in [facility] Video Visit from 07/08/2024 in [facility] Video Visit from 04/08/2024 in [facility] Video Visit from 01/08/2024 in [facility]  Total GAD-7 Score 0 5 0 10 8   PHQ2-9    Flowsheet Row Video Visit from 01/12/2025 in [facility] Video Visit  from 10/13/2024 in [facility] Video Visit from 07/08/2024 in [facility] Video Visit from 04/08/2024 in [facility] Video Visit from 01/08/2024 in [facility]  PHQ-2 Total Score 0 0 0 0 0   Flowsheet Row Video Visit from 01/12/2025 in [facility] Video Visit from 10/13/2024 in [facility] Video Visit from 07/08/2024 in [facility]  C-SSRS RISK CATEGORY Moderate Risk Moderate Risk Moderate Risk     Assessment and Plan:   [name] is a 54 year old, Caucasian female with a past psychiatric history significant for major depressive 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  Patient presents to the encounter stating that she has been taking her medications regularly and denies experiencing any adverse side effects.  An aims assessment was performed with the patient scoring a 0.  Patient denies overt depressive symptoms nor does she endorse anxiety.  She reports that use of her clonazepam  has been effective in managing her anxiety.  A PHQ-2 screen was performed with patient scoring  a 0.  A GAD-7 screen was also performed with the patient scoring a 0.  Patient endorses stability on her current medication regimen and would like to continue taking her medications as prescribed.  Patient's medications to be e-prescribed to pharmacy of choice.  Patient was reminded that a urine drug screen would need to be performed due to her use of clonazepam .  Patient vocalized understanding.  Provider also discussed tapering patient off her clonazepam  and finding other alternatives for the management of her anxiety.  Patient verbalized understanding.  Provider to begin patient's taper during her next scheduled appointment.  An EKG was performed on this patient on 10/19/2024.  Patient's QTc was 432 ms.  A [place] Suicide Severity Rating Scale was performed with the patient being considered moderate risk.  Patient denies suicidal ideations and is able to contract for safety at this time.  Safety planning was discussed with the patient prior to the conclusion of the encounter.  - Patient was instructed to contact 911 in the event of a mental health crisis. - Patient was instructed to contact [facility] [facility] [employer] in the event of a mental health crisis. - Patient was instructed to present to [facility] in the event of a mental health crisis.  Collaboration of Care: Collaboration of Care: Medication Management AEB provider managing patient's psychiatric medications, Primary Care Provider AEB patient being followed by a primary care provider, and Psychiatrist AEB patient being followed by mental health provider at this facility  Patient/Guardian was advised Release of Information must be obtained prior to any record release in order to collaborate their care with an outside provider. Patient/Guardian was advised if they have not already done so to contact the registration department to sign all necessary forms in order for us  to release information regarding  their care.   Consent: Patient/Guardian gives verbal consent for treatment and assignment of benefits for services provided during this visit. Patient/Guardian expressed understanding and agreed to proceed.   1. GAD (generalized anxiety disorder)  - clonazePAM  (KLONOPIN ) 0.5 MG tablet; Take 1 tablet (0.5 mg total) by mouth 2 (two) times daily.  Dispense: 60 tablet; Refill: 0 - gabapentin  (NEURONTIN ) 300 MG capsule; Take 1  capsule (300 mg total) by mouth 3 (three) times daily.  Dispense: 90 capsule; Refill: 3  2. Social anxiety disorder  - clonazePAM  (KLONOPIN ) 0.5 MG tablet; Take 1 tablet (0.5 mg total) by mouth 2 (two) times daily.  Dispense: 60 tablet; Refill: 0  3. Panic disorder  - clonazePAM  (KLONOPIN ) 0.5 MG tablet; Take 1 tablet (0.5 mg total) by mouth 2 (two) times daily.  Dispense: 60 tablet; Refill: 0  4. MDD (major depressive disorder), recurrent, in partial remission  - ARIPiprazole  (ABILIFY ) 5 MG tablet; Take 1 tablet (5 mg total) by mouth daily.  Dispense: 90 tablet; Refill: 0 - traZODone  (DESYREL ) 50 MG tablet; Take 1 tablet (50 mg total) by mouth at bedtime as needed. for sleep  Dispense: 90 tablet; Refill: 0  Patient to follow-up within 3 months Provider spent a total of 10 minutes with the patient/reviewing patient's chart  Reginia FORBES Bolster, PA 01/12/2025, 2:32 PM

## 2025-04-13 ENCOUNTER — Telehealth (HOSPITAL_COMMUNITY): Admitting: Physician Assistant
